# Patient Record
Sex: Female | Born: 1975 | Race: White | Hispanic: No | Marital: Single | Smoking: Former smoker
Health system: Southern US, Community
[De-identification: ages and names within clinical notes are randomized; demographics above are authoritative.]

## PROBLEM LIST (undated history)

## (undated) DIAGNOSIS — F419 Anxiety disorder, unspecified: Secondary | ICD-10-CM

## (undated) DIAGNOSIS — M1990 Unspecified osteoarthritis, unspecified site: Secondary | ICD-10-CM

## (undated) DIAGNOSIS — E282 Polycystic ovarian syndrome: Secondary | ICD-10-CM

## (undated) DIAGNOSIS — M797 Fibromyalgia: Secondary | ICD-10-CM

## (undated) DIAGNOSIS — I82409 Acute embolism and thrombosis of unspecified deep veins of unspecified lower extremity: Secondary | ICD-10-CM

## (undated) DIAGNOSIS — N809 Endometriosis, unspecified: Secondary | ICD-10-CM

## (undated) DIAGNOSIS — Q613 Polycystic kidney, unspecified: Secondary | ICD-10-CM

## (undated) DIAGNOSIS — F32A Depression, unspecified: Secondary | ICD-10-CM

## (undated) DIAGNOSIS — F329 Major depressive disorder, single episode, unspecified: Secondary | ICD-10-CM

## (undated) DIAGNOSIS — R519 Headache, unspecified: Secondary | ICD-10-CM

## (undated) DIAGNOSIS — Z8249 Family history of ischemic heart disease and other diseases of the circulatory system: Secondary | ICD-10-CM

## (undated) DIAGNOSIS — F411 Generalized anxiety disorder: Secondary | ICD-10-CM

## (undated) DIAGNOSIS — N3010 Interstitial cystitis (chronic) without hematuria: Secondary | ICD-10-CM

## (undated) DIAGNOSIS — Z9289 Personal history of other medical treatment: Secondary | ICD-10-CM

## (undated) DIAGNOSIS — J45909 Unspecified asthma, uncomplicated: Secondary | ICD-10-CM

## (undated) DIAGNOSIS — Z87891 Personal history of nicotine dependence: Secondary | ICD-10-CM

## (undated) DIAGNOSIS — D649 Anemia, unspecified: Secondary | ICD-10-CM

## (undated) DIAGNOSIS — R51 Headache: Secondary | ICD-10-CM

## (undated) HISTORY — DX: Unspecified asthma, uncomplicated: J45.909

## (undated) HISTORY — PX: OTHER SURGICAL HISTORY: SHX169

## (undated) HISTORY — PX: BLADDER SURGERY: SHX569

## (undated) HISTORY — PX: LAPAROSCOPY: SHX197

## (undated) HISTORY — DX: Interstitial cystitis (chronic) without hematuria: N30.10

## (undated) HISTORY — PX: TOOTH EXTRACTION: SUR596

## (undated) HISTORY — DX: Fibromyalgia: M79.7

## (undated) HISTORY — DX: Generalized anxiety disorder: F41.1

## (undated) HISTORY — DX: Polycystic ovarian syndrome: E28.2

## (undated) HISTORY — DX: Personal history of other medical treatment: Z92.89

## (undated) HISTORY — PX: ABDOMINAL HYSTERECTOMY: SHX81

## (undated) HISTORY — DX: Polycystic kidney, unspecified: Q61.3

## (undated) HISTORY — DX: Personal history of nicotine dependence: Z87.891

## (undated) HISTORY — DX: Family history of ischemic heart disease and other diseases of the circulatory system: Z82.49

---

## 1998-03-06 ENCOUNTER — Ambulatory Visit (HOSPITAL_COMMUNITY): Admission: RE | Admit: 1998-03-06 | Discharge: 1998-03-06 | Payer: Self-pay | Admitting: Specialist

## 1998-03-24 ENCOUNTER — Encounter: Payer: Self-pay | Admitting: Specialist

## 1998-03-24 ENCOUNTER — Ambulatory Visit (HOSPITAL_COMMUNITY): Admission: RE | Admit: 1998-03-24 | Discharge: 1998-03-24 | Payer: Self-pay | Admitting: Specialist

## 1999-04-05 ENCOUNTER — Ambulatory Visit (HOSPITAL_COMMUNITY): Admission: RE | Admit: 1999-04-05 | Discharge: 1999-04-05 | Payer: Self-pay | Admitting: Family Medicine

## 1999-04-05 ENCOUNTER — Encounter: Payer: Self-pay | Admitting: Family Medicine

## 2000-09-10 ENCOUNTER — Other Ambulatory Visit: Admission: RE | Admit: 2000-09-10 | Discharge: 2000-09-10 | Payer: Self-pay | Admitting: *Deleted

## 2001-09-23 ENCOUNTER — Other Ambulatory Visit: Admission: RE | Admit: 2001-09-23 | Discharge: 2001-09-23 | Payer: Self-pay | Admitting: *Deleted

## 2002-01-08 ENCOUNTER — Encounter: Admission: RE | Admit: 2002-01-08 | Discharge: 2002-01-08 | Payer: Self-pay | Admitting: Family Medicine

## 2002-01-08 ENCOUNTER — Encounter: Payer: Self-pay | Admitting: Family Medicine

## 2002-03-20 ENCOUNTER — Encounter: Admission: RE | Admit: 2002-03-20 | Discharge: 2002-03-20 | Payer: Self-pay | Admitting: Orthopedic Surgery

## 2002-03-20 ENCOUNTER — Encounter: Payer: Self-pay | Admitting: Orthopedic Surgery

## 2002-06-16 ENCOUNTER — Encounter: Admission: RE | Admit: 2002-06-16 | Discharge: 2002-06-16 | Payer: Self-pay | Admitting: Family Medicine

## 2002-06-16 ENCOUNTER — Encounter: Payer: Self-pay | Admitting: Family Medicine

## 2002-10-27 ENCOUNTER — Other Ambulatory Visit: Admission: RE | Admit: 2002-10-27 | Discharge: 2002-10-27 | Payer: Self-pay | Admitting: *Deleted

## 2002-10-29 ENCOUNTER — Encounter: Payer: Self-pay | Admitting: Nephrology

## 2002-10-29 ENCOUNTER — Encounter: Admission: RE | Admit: 2002-10-29 | Discharge: 2002-10-29 | Payer: Self-pay | Admitting: Nephrology

## 2003-03-22 ENCOUNTER — Encounter: Admission: RE | Admit: 2003-03-22 | Discharge: 2003-03-22 | Payer: Self-pay | Admitting: Nephrology

## 2003-03-22 ENCOUNTER — Encounter: Payer: Self-pay | Admitting: Nephrology

## 2003-10-13 ENCOUNTER — Other Ambulatory Visit: Admission: RE | Admit: 2003-10-13 | Discharge: 2003-10-13 | Payer: Self-pay | Admitting: *Deleted

## 2005-06-14 ENCOUNTER — Encounter: Admission: RE | Admit: 2005-06-14 | Discharge: 2005-06-14 | Payer: Self-pay | Admitting: Internal Medicine

## 2005-07-06 ENCOUNTER — Encounter: Admission: RE | Admit: 2005-07-06 | Discharge: 2005-07-06 | Payer: Self-pay | Admitting: Nephrology

## 2006-09-19 ENCOUNTER — Ambulatory Visit (HOSPITAL_COMMUNITY): Admission: RE | Admit: 2006-09-19 | Discharge: 2006-09-19 | Payer: Self-pay | Admitting: *Deleted

## 2006-09-21 ENCOUNTER — Inpatient Hospital Stay (HOSPITAL_COMMUNITY): Admission: AD | Admit: 2006-09-21 | Discharge: 2006-09-21 | Payer: Self-pay | Admitting: Obstetrics and Gynecology

## 2006-10-02 ENCOUNTER — Encounter: Admission: RE | Admit: 2006-10-02 | Discharge: 2006-10-02 | Payer: Self-pay | Admitting: Gastroenterology

## 2007-01-22 ENCOUNTER — Encounter: Admission: RE | Admit: 2007-01-22 | Discharge: 2007-01-22 | Payer: Self-pay | Admitting: Internal Medicine

## 2007-02-28 ENCOUNTER — Encounter: Admission: RE | Admit: 2007-02-28 | Discharge: 2007-02-28 | Payer: Self-pay | Admitting: Nephrology

## 2007-09-16 ENCOUNTER — Encounter (INDEPENDENT_AMBULATORY_CARE_PROVIDER_SITE_OTHER): Payer: Self-pay | Admitting: *Deleted

## 2007-09-16 ENCOUNTER — Ambulatory Visit (HOSPITAL_COMMUNITY): Admission: RE | Admit: 2007-09-16 | Discharge: 2007-09-16 | Payer: Self-pay | Admitting: *Deleted

## 2007-12-07 ENCOUNTER — Inpatient Hospital Stay (HOSPITAL_COMMUNITY): Admission: AD | Admit: 2007-12-07 | Discharge: 2007-12-08 | Payer: Self-pay | Admitting: Obstetrics & Gynecology

## 2008-02-18 ENCOUNTER — Encounter: Admission: RE | Admit: 2008-02-18 | Discharge: 2008-02-18 | Payer: Self-pay | Admitting: Otolaryngology

## 2008-02-18 ENCOUNTER — Emergency Department (HOSPITAL_COMMUNITY): Admission: EM | Admit: 2008-02-18 | Discharge: 2008-02-18 | Payer: Self-pay | Admitting: Family Medicine

## 2008-02-20 ENCOUNTER — Ambulatory Visit (HOSPITAL_COMMUNITY): Admission: RE | Admit: 2008-02-20 | Discharge: 2008-02-20 | Payer: Self-pay | Admitting: Urology

## 2008-03-19 ENCOUNTER — Encounter (INDEPENDENT_AMBULATORY_CARE_PROVIDER_SITE_OTHER): Payer: Self-pay | Admitting: Urology

## 2008-03-19 ENCOUNTER — Ambulatory Visit (HOSPITAL_BASED_OUTPATIENT_CLINIC_OR_DEPARTMENT_OTHER): Admission: RE | Admit: 2008-03-19 | Discharge: 2008-03-19 | Payer: Self-pay | Admitting: Urology

## 2008-05-22 ENCOUNTER — Inpatient Hospital Stay (HOSPITAL_COMMUNITY): Admission: AD | Admit: 2008-05-22 | Discharge: 2008-05-23 | Payer: Self-pay | Admitting: Obstetrics and Gynecology

## 2008-06-23 ENCOUNTER — Encounter: Admission: RE | Admit: 2008-06-23 | Discharge: 2008-06-23 | Payer: Self-pay | Admitting: Nephrology

## 2008-09-11 ENCOUNTER — Emergency Department (HOSPITAL_COMMUNITY): Admission: EM | Admit: 2008-09-11 | Discharge: 2008-09-11 | Payer: Self-pay | Admitting: Emergency Medicine

## 2009-02-16 ENCOUNTER — Inpatient Hospital Stay (HOSPITAL_COMMUNITY): Admission: AD | Admit: 2009-02-16 | Discharge: 2009-02-17 | Payer: Self-pay | Admitting: Obstetrics & Gynecology

## 2009-04-05 ENCOUNTER — Inpatient Hospital Stay (HOSPITAL_COMMUNITY): Admission: AD | Admit: 2009-04-05 | Discharge: 2009-04-05 | Payer: Self-pay | Admitting: Obstetrics

## 2009-04-20 ENCOUNTER — Ambulatory Visit: Payer: Self-pay | Admitting: Family Medicine

## 2009-04-25 ENCOUNTER — Emergency Department (HOSPITAL_COMMUNITY): Admission: EM | Admit: 2009-04-25 | Discharge: 2009-04-25 | Payer: Self-pay | Admitting: Emergency Medicine

## 2009-04-27 ENCOUNTER — Ambulatory Visit: Payer: Self-pay | Admitting: Family Medicine

## 2009-05-05 ENCOUNTER — Ambulatory Visit: Payer: Self-pay | Admitting: Family Medicine

## 2009-05-25 ENCOUNTER — Ambulatory Visit: Payer: Self-pay | Admitting: Family Medicine

## 2009-07-28 ENCOUNTER — Encounter: Admission: RE | Admit: 2009-07-28 | Discharge: 2009-07-28 | Payer: Self-pay | Admitting: Family Medicine

## 2009-07-28 ENCOUNTER — Ambulatory Visit: Payer: Self-pay | Admitting: Family Medicine

## 2009-08-05 ENCOUNTER — Encounter: Admission: RE | Admit: 2009-08-05 | Discharge: 2009-08-05 | Payer: Self-pay | Admitting: Otolaryngology

## 2009-08-16 ENCOUNTER — Ambulatory Visit: Payer: Self-pay | Admitting: Family Medicine

## 2009-11-14 ENCOUNTER — Ambulatory Visit: Payer: Self-pay | Admitting: Family Medicine

## 2009-11-29 ENCOUNTER — Ambulatory Visit: Payer: Self-pay | Admitting: Family Medicine

## 2010-03-14 ENCOUNTER — Ambulatory Visit: Payer: Self-pay | Admitting: Family Medicine

## 2010-05-23 ENCOUNTER — Ambulatory Visit: Payer: Self-pay | Admitting: Family Medicine

## 2010-07-23 ENCOUNTER — Encounter: Payer: Self-pay | Admitting: Nephrology

## 2010-09-25 ENCOUNTER — Ambulatory Visit (INDEPENDENT_AMBULATORY_CARE_PROVIDER_SITE_OTHER): Payer: PRIVATE HEALTH INSURANCE | Admitting: Family Medicine

## 2010-09-25 DIAGNOSIS — J069 Acute upper respiratory infection, unspecified: Secondary | ICD-10-CM

## 2010-09-25 DIAGNOSIS — F411 Generalized anxiety disorder: Secondary | ICD-10-CM

## 2010-09-25 DIAGNOSIS — F17200 Nicotine dependence, unspecified, uncomplicated: Secondary | ICD-10-CM

## 2010-09-25 DIAGNOSIS — J309 Allergic rhinitis, unspecified: Secondary | ICD-10-CM

## 2010-10-05 LAB — CBC
HCT: 34 % — ABNORMAL LOW (ref 36.0–46.0)
Hemoglobin: 11.3 g/dL — ABNORMAL LOW (ref 12.0–15.0)
MCHC: 33.3 g/dL (ref 30.0–36.0)
MCV: 92.7 fL (ref 78.0–100.0)
Platelets: 335 10*3/uL (ref 150–400)
RBC: 3.66 MIL/uL — ABNORMAL LOW (ref 3.87–5.11)
RDW: 13.6 % (ref 11.5–15.5)
WBC: 15.5 10*3/uL — ABNORMAL HIGH (ref 4.0–10.5)

## 2010-10-05 LAB — URINALYSIS, ROUTINE W REFLEX MICROSCOPIC
Hgb urine dipstick: NEGATIVE
Ketones, ur: 15 mg/dL — AB
Nitrite: NEGATIVE
Protein, ur: 30 mg/dL — AB
Urobilinogen, UA: 0.2 mg/dL (ref 0.0–1.0)

## 2010-10-05 LAB — COMPREHENSIVE METABOLIC PANEL WITH GFR
ALT: 17 U/L (ref 0–35)
AST: 18 U/L (ref 0–37)
Albumin: 3.4 g/dL — ABNORMAL LOW (ref 3.5–5.2)
Alkaline Phosphatase: 45 U/L (ref 39–117)
BUN: 8 mg/dL (ref 6–23)
CO2: 26 meq/L (ref 19–32)
Calcium: 9.2 mg/dL (ref 8.4–10.5)
Chloride: 101 meq/L (ref 96–112)
Creatinine, Ser: 0.61 mg/dL (ref 0.4–1.2)
GFR calc non Af Amer: >60 mL/min
Glucose, Bld: 105 mg/dL — ABNORMAL HIGH (ref 70–99)
Potassium: 3.7 meq/L (ref 3.5–5.1)
Sodium: 133 meq/L — ABNORMAL LOW (ref 135–145)
Total Bilirubin: 0.4 mg/dL (ref 0.3–1.2)
Total Protein: 6.3 g/dL (ref 6.0–8.3)

## 2010-10-05 LAB — WET PREP, GENITAL
Trich, Wet Prep: NONE SEEN
Yeast Wet Prep HPF POC: NONE SEEN

## 2010-10-05 LAB — DIFFERENTIAL
Basophils Relative: 1 % (ref 0–1)
Eosinophils Absolute: 0.1 10*3/uL (ref 0.0–0.7)
Eosinophils Relative: 1 % (ref 0–5)
Lymphs Abs: 3 10*3/uL (ref 0.7–4.0)
Neutrophils Relative %: 75 % (ref 43–77)

## 2010-10-05 LAB — GC/CHLAMYDIA PROBE AMP, GENITAL
Chlamydia, DNA Probe: NEGATIVE
GC Probe Amp, Genital: NEGATIVE

## 2010-10-05 LAB — URINE MICROSCOPIC-ADD ON

## 2010-10-07 LAB — GC/CHLAMYDIA PROBE AMP, GENITAL
Chlamydia, DNA Probe: NEGATIVE
GC Probe Amp, Genital: NEGATIVE

## 2010-10-07 LAB — URINALYSIS, ROUTINE W REFLEX MICROSCOPIC
Hgb urine dipstick: NEGATIVE
Nitrite: POSITIVE — AB
Specific Gravity, Urine: >1.03 — ABNORMAL HIGH (ref 1.005–1.030)
Urobilinogen, UA: 1 mg/dL (ref 0.0–1.0)
pH: 5.5 (ref 5.0–8.0)

## 2010-10-07 LAB — URINE MICROSCOPIC-ADD ON

## 2010-10-07 LAB — WET PREP, GENITAL: Trich, Wet Prep: NONE SEEN

## 2010-11-09 ENCOUNTER — Encounter: Payer: Self-pay | Admitting: *Deleted

## 2010-11-09 ENCOUNTER — Ambulatory Visit (INDEPENDENT_AMBULATORY_CARE_PROVIDER_SITE_OTHER): Payer: PRIVATE HEALTH INSURANCE | Admitting: Family Medicine

## 2010-11-09 ENCOUNTER — Encounter: Payer: Self-pay | Admitting: Family Medicine

## 2010-11-09 VITALS — BP 102/62 | HR 80 | Temp 98.3°F | Ht 61.0 in | Wt 128.0 lb

## 2010-11-09 DIAGNOSIS — F411 Generalized anxiety disorder: Secondary | ICD-10-CM

## 2010-11-09 DIAGNOSIS — H6980 Other specified disorders of Eustachian tube, unspecified ear: Secondary | ICD-10-CM

## 2010-11-09 DIAGNOSIS — F17200 Nicotine dependence, unspecified, uncomplicated: Secondary | ICD-10-CM

## 2010-11-09 MED ORDER — BUPROPION HCL ER (XL) 150 MG PO TB24: 150.0000 mg | ORAL_TABLET | ORAL | Status: AC

## 2010-11-09 NOTE — Patient Instructions (Addendum)
Decongestants, Mucinex and sinus rinses are recommended Start the Wellbutrin--take in the morning.  Continue your Zoloft.   Strongly encourage cessation of tobacco and marijuana use

## 2010-11-09 NOTE — Progress Notes (Signed)
Patient presents with complaint of R ear pain x 2 days.  No associated runny nose, sore throat, does complain of pressure between her eyes.  Postnasal drainage, and occasionally coughs up discolored phlegm.  Low grade fever yesterday.  No sick contacts.  She continues to smoke cigarettes, as well as marijuana on a daily basis.  She is having a lot of anxiety related to her father's health (in hospital, waiting for kidney transplant, getting cardiac cath now).  Has been on 200mg  of Zoloft for her anxiety for quite some time (about 3 years at this dose), doesn't feel that it is working as well as it used to.  Recalls having tried Wellbutrin in the past--for smoking cessation, unsuccessful.  Doesn't recall having had any side effects to it.    She will be turning 35 this summer, is on birth control pills and continues to smoke.  She has appt with GYN next week to discuss her options.  ROS: no n/v/d, no chest pain, palpitations, skin rashes, joint pains, urinary problems (per baseline) or other concerns Mild sinus headaches qod  Physical exam: BP 102/62  Pulse 80  Temp(Src) 98.3 F (36.8 C) (Oral)  Ht 5\' 1"  (1.549 m)  Wt 128 lb (58.06 kg)  BMI 24.19 kg/m2 HEENT:  TMs and EAC's normal bilaterally.  Nasal mucosa mildly edematous, no erythema or purulence.  OP clear.  Sinuses--tender x 4, mildly so, mostly between eyes. Neck:  No LAD or thyromegaly Heart: RRR no m/r/g Lungs: CTA Psych: normal mood, affect, grooming, normal speech and eye contact  Assessment/plan: 1. Eustachian tube dysfunction    2. Anxiety state, unspecified  buPROPion (WELLBUTRIN XL) 150 MG 24 hr tablet   Add Wellbutrin to Zoloft.  If not effective, consider adding Buspar at f/u  3. Tobacco use disorder     Decongestants, Mucinex and sinus rinses are recommended.  Strongly encouraged to quit smoking (both cigarettes and marijuana).  Risks of smoking and OCP's reviewed with pt.  She will discuss with her GYN.

## 2010-11-10 ENCOUNTER — Ambulatory Visit: Payer: PRIVATE HEALTH INSURANCE | Admitting: Medical

## 2010-11-14 NOTE — Op Note (Signed)
NAME:  Sharon Bowman, BURESH NO.:  [id number]   MEDICAL RECORD NO.:  [id number]          PATIENT TYPE:  AMB   LOCATION:  DAY                          FACILITY:  [facility]   PHYSICIAN:  Bettles B. Earlene Plater, M.D.  DATE OF BIRTH:  June 09, 1976   DATE OF PROCEDURE:  09/16/2007  DATE OF DISCHARGE:                               OPERATIVE REPORT   PREOPERATIVE DIAGNOSES:  1. Pelvic pain.  2. Dysmenorrhea.  3. Endometriosis.   POSTOPERATIVE DIAGNOSES:  1. Pelvic pain.  2. Dysmenorrhea.  3. Endometriosis.   PROCEDURES:  Da Vinci laparoscopy, lysis of adhesions and ureterolysis  (right-sided) and excision of endometriosis from posterior cul-de-sac.   SURGEON:  Chester Holstein. Earlene Plater, M.D.   ASSISTANT:  Genia Del, M.D.   ANESTHESIA:  General.   FINDINGS:  Rectum adherent to the posterior uterus, endometriosis left  and right uterosacral ligaments.   SPECIMENS:  Right and left sided uterosacral ligament, endometriosis  implants to pathology.   BLOOD LOSS:  Minimal.   COMPLICATIONS:  None.   HISTORY OF PRESENT ILLNESS:  Patient with a known history of surgical  diagnosis endometriosis in the past, medically managed after surgery  with birth control pills, now presents with recurring increasing pelvic  pain, dysmenorrhea, dyspareunia.  Advised of the risks of surgery  including infection, bleeding and damage to surrounding organs.   PROCEDURE:  The patient is taken to the operating room and general  anesthesia obtained.  Prepped and draped in standard fashion.  Bladder  drained with Foley catheter.  Uterus sounded to 8 cm, #8 Rumi tip  inserted and secured in standard manner.   A 10 mm incision placed in the umbilicus, fascia divided sharply and  elevated.  Posterior sheath and peritoneum elevated and entered sharply.  Pursestring suture of zero Vicryl placed on the left fascial defect.  Hassan cannula inserted and secured.  Pneumoperitoneum obtained with CO2  gas. An 8  mm trocar placed left of the umbilical incision.  Trendelenburg position obtained and pelvis inspected, above findings  noted.  Given the pathology, additional ports were inserted, an 8 mm to  the right of midline and a 5 mm as well.   Steep Trendelenburg position obtained.  Robot brought up and docked in  standard manner.   The course of each ureter was identified.  The left ureter was well  lateral to the endometriosis implants and rectal adhesions to the middle  portion of the posterior uterus between the uterosacral ligaments.  There was a slight window to the right of midline allowing visualization  into the posterior cul-de-sac.  This line was used to dissect the rectum  off the uterus to the left.  The ureter was well away throughout the  dissection.  There were endometriosis implants on the left uterosacral  ligament on the medial portion which were excised sharply.   On the right side the ureter appeared to go right through the area of  adhesions which was scarring including uterosacral ligament and rectum.  Therefore, the rectum was deviated medially, peritoneum elevated over  the iliac, entered and retroperitoneal dissection proceeded  along the  course of the ureter to mobilize the ureter away from the area of  adhesions at the insertion of the right uterosacral ligament.  This also  allowed dissection of the rectum away from the uterus on the right side.  Then the endometriosis implants were sharply excised and hemostasis  obtained.   The pelvis was then copiously irrigated.  Pneumoperitoneum taken down to  minimal settings and pelvis inspected with no bleeding seen.  Intercede  was placed along the posterior cul-de-sac covering the two areas of  dissection.   The remainder of the pelvis was inspected and no additional findings  were of note other than a old scar of endometriosis in the anterior cul-  de-sac to the right of midline which did not appear active and was   therefore left untreated.  The appendix appeared normal and the bowel  appeared normal.  Therefore the procedure was terminated.   The robot was undocked and taken away.  The ports were removed.  Their  sites were inspected laparoscopically and were hemostatic.  Scope was  removed.  Gas released.  Hassan cannula removed.  Umbilical incision  elevated with Army-Navy retractors.  Pursestring suture snugged down.  This obliterated the fascial defect and no intra-abdominal contents  herniated through prior to closure.  Skin was closed at each site with 4-  0 Vicryl and Dermabond.  The Rumi was removed and cervix hemostatic.   The patient tolerated the procedure well with no complications.  She was  taken to the recovery room awake, alert and in stable condition.      Gerri Spore B. Earlene Plater, M.D.  Electronically Signed     WBD/MEDQ  D:  09/16/2007  T:  09/16/2007  Job:  [id number]

## 2010-11-14 NOTE — Op Note (Signed)
NAME:  Sharon, Bowman NO.:  [id number]   MEDICAL RECORD NO.:  [id number]          PATIENT TYPE:  AMB   LOCATION:  [facility]                         FACILITY:  [facility]   PHYSICIAN:  Jamison Neighbor, M.D.  DATE OF BIRTH:  [birth year]   DATE OF PROCEDURE:  03/19/2008  DATE OF DISCHARGE:  03/19/2008                               OPERATIVE REPORT   PREOPERATIVE DIAGNOSIS:  Interstitial cystitis.   POSTOPERATIVE DIAGNOSIS:  Interstitial cystitis.   PROCEDURE:  Cystoscopy, urethral calibration, hydrodistention of the  bladder, Marcaine and Pyridium installation, Marcaine and Kenalog  injection, bladder biopsy.   SURGEON:  Jamison Neighbor, M.D.   ANESTHESIA:  General.   COMPLICATIONS:  None.   DRAINS:  None.   BRIEF HISTORY:  This 35 year old female has chronic pelvic pain.  She  does have a past history of endometriosis and underwent a laparoscopy in  the past by Dr. Marina Gravel.  The patient would like to undergo  cystoscopy, hydrodistention for reevaluation of her pelvic pain.  The  patient is now to undergo cystoscopic evaluation and we did discuss with  gynecology whether they elect to do a laparoscopy but they are going to  wait until they see whether the patient has any improvement with  hydrodistention.  Full informed consent was obtained.   PROCEDURE:  After successful induction of general anesthesia, the  patient was placed in the dorsal lithotomy position, prepped with  Betadine and draped in the usual sterile fashion.  Careful bimanual  examination revealed an unremarkable urethra with no signs of  diverticulum.  There was no significant cystocele, rectocele or  enterocele.  There were no masses on bimanual exam.  The uterus was  palpably normal.  The urethra was calibrated 32-French with female  urethral sounds with no signs of stenosis or stricture.  The cystoscope  was inserted.  The bladder was carefully inspected.  No tumors or stones  could be seen.   Both orifices were normal in configuration and location.  Hydrodistention of the bladder was performed.  The bladder was distended  at a pressure of 100 cm of water for 5 minutes.  When the bladder was  drained, the bladder capacity of 550 mL was markedly diminished compared  to normal bladder capacity of 1100.  There were glomerulations seen but  no ulcer formation.  A bladder biopsy was taken and the biopsy site was  cauterized.  This will be sent for mast cell analysis.  The bladder was  drained.  A mixture of Marcaine and Pyridium was left in the bladder.  Marcaine and Kenalog were injected periurethrally.  The patient  tolerated the procedure well and was taken to recovery room in good  condition.  She was given a B & O suppository intraoperatively as well  as Toradol and Zofran.  She will be sent home with Lorcet Plus, Pyridium  Plus and doxycycline.  When she returns, we will review of biopsy as  well as results of her hydrodistention to determine  if she had any improvement and likely will start her on interstitial  cystitis directed  therapy with Elmiron based protocol.  This may also  include instillation therapy.  If the patient still has low lying pelvic  pain that does not appear to respond, we certainly recommend  reevaluation determine if her endometriosis has recurred.      Jamison Neighbor, M.D.  Electronically Signed     RJE/MEDQ  D:  03/19/2008  T:  03/22/2008  Job:  [id number]   cc:   Lendon Colonel, MD

## 2010-11-16 ENCOUNTER — Encounter (INDEPENDENT_AMBULATORY_CARE_PROVIDER_SITE_OTHER): Payer: PRIVATE HEALTH INSURANCE | Admitting: Obstetrics and Gynecology

## 2010-11-16 DIAGNOSIS — Z1272 Encounter for screening for malignant neoplasm of vagina: Secondary | ICD-10-CM

## 2010-11-16 DIAGNOSIS — Z01419 Encounter for gynecological examination (general) (routine) without abnormal findings: Secondary | ICD-10-CM

## 2010-11-16 DIAGNOSIS — Z113 Encounter for screening for infections with a predominantly sexual mode of transmission: Secondary | ICD-10-CM

## 2010-11-17 NOTE — Consult Note (Signed)
NAME:  Sharon Bowman, Sharon Bowman NO.:  [id number]   MEDICAL RECORD NO.:  [id number]          PATIENT TYPE:  MAT   LOCATION:  [facility]                          FACILITY:  [facility]   PHYSICIAN:  Richardean Sale, M.D.   DATE OF BIRTH:  1976-02-11   DATE OF CONSULTATION:  09/21/2006  DATE OF DISCHARGE:                                 CONSULTATION   CHIEF COMPLAINT:  Abdominal and back pain status post laparoscopy on  09/19/2006.   HISTORY OF PRESENT ILLNESS:  This is a 35 year old white female with a  known history of interstitial cystitis who underwent a laparoscopy with  lysis of adhesions and fulguration of endometriosis on 09/19/2006  without complications for endometriosis and pelvic adhesive disease.  The patient was discharged home in standard fashion the same day as her  procedure. She was taking Vicodin as needed for pain 1 tablet every 4  hours.  She has complained of increased abdominal swelling and lower  back pain that radiates up to her middle back that is bilateral. She  denies any dysuria or hematuria other than her known symptoms from  interstitial cystitis and she reports passing flatus, but no bowel  movement.  She had one episode of vomiting on postop day zero, but  otherwise has been able to tolerate p.o. intake.  She denies any fever,  chills or sweats or any drainage from her incision.   PAST MEDICAL HISTORY:  Significant for  1. Interstitial cystitis.  2. Polycystic ovarian syndrome.  3. Polycystic kidney disease.   SURGICAL HISTORY:  1. Bladder cystoscopy.  2. Recent laparoscopy.   MEDICATIONS:  1. Ultram for her kidney disease.  2. Recent use of Vicodin and Darvocet for postoperative pain.   ALLERGIES:  LATEX.   SOCIAL HISTORY:  Denies tobacco, alcohol or drugs.   PHYSICAL EXAMINATION:  VITAL SIGNS:  She is afebrile.  Her vital signs  are stable.  GENERAL:  She is a well-developed, well-nourished white female who  appears uncomfortable, but in no  acute distress.  HEART:  Regular rate and rhythm.  LUNGS: Clear to auscultation bilaterally.  ABDOMEN: Soft, mildly distended, tympanic to percussion, not acute.  No  rebound or guarding. Incisions are clean, dry and intact.  There is a 3  x 5 cm area of erythema just inferior to the of the umbilical incision  that is slightly warm to the touch.  BACK:  No CVA tenderness.  Mild tenderness to palpation and bilateral  lower side of the back.   LABORATORY STUDIES:  Hemoglobin is 11.1, hematocrit 33.4, white count  10.5, platelet count is 260.  Urinalysis is negative except for small  hemoglobin.  The patient is having some vaginal bleeding as expected  from her surgery. Metabolic panel: Sodium 138, potassium 3.8, chloride  104, carbon dioxide 28, glucose 116, BUN 3, creatinine 0.55, AST is 57,  ALT is 54. Alkaline phosphatase 51.  Bilirubin 0.3.   ASSESSMENT:  This is a 35 year old white female who is postop day #2  status post laparoscopy with lysis of adhesions and fulguration of  endometriosis now with abdominal  tenderness, lower back pain and  erythema around her umbilical incision.   PLAN:  1. For the umbilical incision, we will start Keflex 500 mg p.o. t.i.d.      for 7 days for probable cellulitis.  2. Abdominal pain and back pain.  Suspect this is likely typical      postop pain although the patient does seem slightly more      uncomfortable than expected on exam. She has passed some gas      although not a large amount.  She has good bowel sounds, and I      doubt this is an obstructive process. We will check ultrasound of      the abdomen and pelvis uncertain as to why her liver function      enzymes may be slightly elevated although it may be due to      medication and acetaminophen use.  We will wait ultrasound results.  3  .  Will administer Toradol 30 mg x1 for pain while ultrasound is  pending.      Richardean Sale, M.D.  Electronically Signed     JW/MEDQ  D:   09/21/2006  T:  09/21/2006  Job:  [id number]

## 2010-11-17 NOTE — Assessment & Plan Note (Signed)
NAME:  Sharon Bowman, Sharon Bowman NO.:  [id number]  MEDICAL RECORD NO.:  [id number]            PATIENT TYPE:  LOCATION:  [facility]           FACILITY:  PHYSICIAN:  Catalina Antigua, MD          DATE OF BIRTH:  DATE OF SERVICE:  11/16/2010                                 CLINIC NOTE  This is a 35 year old G1, P0-0-1-0, who presents today for annual exam. The patient is currently without any complaints.  Denies abnormal bleeding or discharge.  The patient reports a history of endometriosis, for which she is on continuous OCPs and states that her pain is relatively well managed with this current regimen.  PAST MEDICAL HISTORY:  Significant for polycystic kidney disease which is being followed at [facility], also interstitial cystitis which is being followed by urologist, and endometriosis diagnosed on laparoscopy.  PAST SURGICAL HISTORY:  She has had 2 laparoscopies in 2008 and 2010 for management of endometriosis.  PAST OB HISTORY:  She has had 1 termination approximately 18 years ago.  PAST GYN HISTORY:  She denies any cyst or fibroids or history of abnormal Pap smear.  FAMILY HISTORY:  Significant for diabetes and heart disease in her father.  Her father also has polycystic kidney disease and hypertension in both of her parents.  SOCIAL HISTORY:  She denies drinking or use of illicit drugs.  She is a current smoker of 1 pack per day for at least 15 years.  REVIEW OF SYSTEMS:  Otherwise, within normal limits.  PHYSICAL EXAMINATION:  VITAL SIGNS:  Her blood pressure is 101/64, pulse of 67, weight of 128 pounds, height of 5 feet and 1 inch. LUNGS:  Clear to auscultation bilaterally. HEART:  Regular rate and rhythm. BREASTS:  Nontender, equal in size.  No palpable masses or lymphadenopathy.  No expressible nipple discharge or skin dimpling. ABDOMEN:  Soft, nontender, nondistended.  Slight discomfort over the suprapubic region. PELVIC:  She had  normal-appearing external genitalia, normal-appearing vaginal mucosa and cervix.  No abnormal bleeding or discharge.  She had a small retroverted uterus.  No palpable adnexal masses or tenderness.  ASSESSMENT AND PLAN:  This is a 35 year old G1, P0-0-1-0 who is here for annual exam.  Pap smear and cultures were performed.  The patient desired full STD testing and therefore blood work for hepatitis B, C, human immunodeficiency virus, and syphilis screening will be obtained. The patient was counseled regarding the use of oral contraceptive pills and smoking with associated increased risk of coronary artery disease. The patient verbalized understanding and understood that the oral contraceptive pills will need to be discontinued.  The patient was counseled on the use of Depo-Provera to help with the management of her endometriosis.  The patient states that she will return to schedule the appointment for the Depo-Provera in a month or 2 when her oral contraceptive pills will be completed.  The patient also expressed the desire of having children, but reports that she was told in the past that she could not get pregnant in view of her extensive endometriosis as well as her polycystic kidney disease.  A referral to Reproductive Endocrinology and infertility specialist was offered, but the  patient declined at this time but will call when she is ready.  The patient will be contacted with any abnormal results.  The patient will return in a few months to initiate Depo-Provera for her management of her endometriosis or p.r.n.  The patient does not desire hysterectomy or oophorectomy for the management of her endometriosis as she states she is still battling with the idea of starting a family.          ______________________________ Catalina Antigua, MD    PC/MEDQ  D:  11/16/2010  T:  11/16/2010  Job:  [id number]

## 2010-11-17 NOTE — Op Note (Signed)
NAME:  Sharon Bowman, Sharon Bowman NO.:  [id number]   MEDICAL RECORD NO.:  [id number]          PATIENT TYPE:  AMB   LOCATION:  [facility]                           FACILITY:  [facility]   PHYSICIAN:  [place] B. Earlene Plater, M.D.  DATE OF BIRTH:  [birth year]   DATE OF PROCEDURE:  09/18/2006  DATE OF DISCHARGE:                               OPERATIVE REPORT   PREOPERATIVE DIAGNOSIS:  Pelvic pain.   POSTOPERATIVE DIAGNOSIS:  Pelvic pain.   PROCEDURE:  Diagnostic laparoscopy, lysis of adhesions, fulguration of  endometriosis.   SURGEON:  Marina Gravel, MD   ASSISTANT:  None.   ANESTHESIA:  General.   SPECIMENS:  None.   BLOOD LOSS:  None.   COMPLICATIONS:  None.   SURGICAL FINDINGS:  Posterior cul-de-sac is obliterated with the rectum  adherent to posterior uterus.  Endometriosis scattered implants noted  bilateral pelvic sidewall and at the cecum.  Appendix appeared normal.  Upper abdomen appeared normal.   INDICATION:  The patient with a history of pelvic pain unresponsive to  medical management also has a history of interstitial cystitis and a  previous history of infertility which did not respond to  Injectable fertility drugs.  The patient was advised of the risks of  surgical infection, bleeding, damage to surrounding organs.   PROCEDURE:  The patient taken to the operating room, general anesthesia  obtained.  She was prepped and draped in standard fashion.  Foley  catheter inserted into the bladder.  Hulka tenaculum attached to the  anterior lip of the cervix.   A 10 mm incision placed in the umbilicus, carried sharply to the fascia.  The fascia was divided sharply and elevated with Kocher clamps.  The  posterior sheath and peritoneum elevated and entered sharply.  Pursestring suture of 0 Vicryl placed around the fascial defect.  Hassan  cannula inserted and secured.  Pneumoperitoneum with CO2 gas.  A 5 mm  port placed in left lower quadrant under direct laparoscopic  visualization.   Trendelenberg position obtained.  Pelvis inspected with the above  findings noted.  The adhesions from the rectum to the uterus were mostly  filmy.  The right ovary was also noted to have some filmy adhesions to  the rectum.  These were taken down sharply.  In the posterior cul-de-  sac, the uterus and rectum could not be completely separated as in the  deeper portion, the adhesions were very dense.  It was my opinion that  lysing these would not be of any particular benefit, as they would  likely reform.  There was no active endometriosis visualized in the cul-  de-sac with what visualization I had of the posterior cul-de-sac.  The  anterior cul-de-sac was clear.  There were some scattered endometriosis  implants 2-3 mm right pelvic sidewall which were treated with  electrocautery.  The right ovary was tethered to the rectum, and this  was taken down sharply.  Left tube and ovary appeared normal.  No  evidence of endometriosis of either ovary.  Given there was no other  active disease noted and no other adhesions that  I felt were treatable,  the procedure was terminated.   The inferior port was removed.  The site was hemostatic.  Scope was  removed, gas released,  Hasson cannula removed.  Abdominal wall elevated  with Army-Navy retractor, pursestring suture snugged down.  This  obliterated the fascial defect.  No intra-abdominal contents herniated  through prior closure.  Skin was closed at each site with Dermabond,  Hulka tenaculum removed, cervix hemostatic.   The patient tolerated the procedure well with no complications.  She was  taken to the recovery room awake, alert, and in stable condition.      Gerri Spore B. Earlene Plater, M.D.  Electronically Signed     WBD/MEDQ  D:  09/19/2006  T:  09/19/2006  Job:  [id number]

## 2010-11-17 NOTE — Consult Note (Signed)
NAME:  JONNE, ROTE NO.:  [id number]   MEDICAL RECORD NO.:  [id number]          PATIENT TYPE:  MAT   LOCATION:  [facility]                          FACILITY:  [facility]   PHYSICIAN:  Richardean Sale, M.D.   DATE OF BIRTH:  [birth year]   DATE OF CONSULTATION:  09/21/2006  DATE OF DISCHARGE:                                 CONSULTATION   SUBJECTIVE:  The patient states that pain is now improved after  receiving Toradol 30 mg IM x1. The pain is now a 3 out of 10. She denies  nausea and vomiting.   OBJECTIVE:  VITAL SIGNS:  She is afebrile. Vital signs are stable.  ABDOMEN:  Soft. Mildly tender to palpation. No significant change from  prior examination.   LABORATORY/DIAGNOSTIC DATA:  Ultrasound reveals non-specific mild  gallbladder wall thickening with gallbladder sludge but no stones or  pericholecystic fluid, likely due to suboptimal distention. No biliary  dilation and no other significant abnormalities. Pelvis is negative  except for a 2.5 cm right ovarian follicle.   ASSESSMENT:  A 35 year old white female, postoperative day 2, status  post diagnostic laparoscopy with lysis of adhesions, fulguration of  endometriosis, now with abdominal pain, lower back pain, and incidental  elevated liver function studies.   PLAN:  1. Pain seems to be improved with Toradol. Given that the patient has      polycystic kidney disease, we cannot use any prolonged course of      non-steroidal anti-inflammatories. Therefore, will treat pain with      plain oxycodone 5 mg 1 to 2 tabs p.o. q.4 to 6 hours p.r.n. pain.      The patient is to avoid any acetaminophen containing medications at      this time, given her elevated LFT's. I suspect her elevated LFT's      are due to acetaminophen and not any acute gallbladder or liver      dysfunction but I would like for her to followup with      gastroenterology this week and she will need repeat LFT's after she      has been off of any  acetaminophen containing medications.  2. Mild cellulitis of the umbilical incision. Will start Dicloxacillin      250 mg p.o. q.6 hours x7 days. The patient will call for any      spreading, erythema, or any drainage from her incision, or any      fever over 100.5.      Richardean Sale, M.D.  Electronically Signed     JW/MEDQ  D:  09/21/2006  T:  09/21/2006  Job:  [id number]

## 2010-11-23 ENCOUNTER — Telehealth: Payer: Self-pay | Admitting: Family Medicine

## 2010-11-23 MED ORDER — AMOXICILLIN 500 MG PO TABS: ORAL_TABLET | ORAL | Status: DC

## 2010-11-23 NOTE — Telephone Encounter (Signed)
Advise pt that Amoxicillin was sent to her pharmacy.  Remind her that antibiotics lessen the efficacy of her OCP's and to use back-up contraception to avoid unwanted pregnancy.  Continue to use Mucinex and decongestants as needed.  Follow up next week if not improving

## 2010-11-23 NOTE — Telephone Encounter (Signed)
Pt called still coughing up green mucous.  Pt req antibiotic be called in to [facility] [address]

## 2010-11-24 ENCOUNTER — Other Ambulatory Visit: Payer: Self-pay

## 2010-11-24 NOTE — Telephone Encounter (Signed)
Left message for pt med had been called in per [facility]

## 2010-11-30 ENCOUNTER — Ambulatory Visit: Payer: PRIVATE HEALTH INSURANCE

## 2010-12-21 ENCOUNTER — Ambulatory Visit: Payer: PRIVATE HEALTH INSURANCE | Admitting: Family Medicine

## 2011-02-02 ENCOUNTER — Encounter: Payer: Self-pay | Admitting: Medical

## 2011-02-02 ENCOUNTER — Ambulatory Visit (INDEPENDENT_AMBULATORY_CARE_PROVIDER_SITE_OTHER): Payer: PRIVATE HEALTH INSURANCE | Admitting: Medical

## 2011-02-02 VITALS — BP 100/60 | HR 68 | Temp 98.1°F | Ht 61.0 in | Wt 120.0 lb

## 2011-02-02 DIAGNOSIS — M65839 Other synovitis and tenosynovitis, unspecified forearm: Secondary | ICD-10-CM

## 2011-02-02 NOTE — Progress Notes (Signed)
Subjective:   HPI Sharon Bowman is a 35 y.o. female who presents for intermittent pain in her right hand since September 2011. She denies injury or trauma, but just notes that she gets pain along her forearm, hand, and pain into her right middle finger only. Motion makes the pain worse, nothing really helps the pain. She did buy an over-the-counter soft brace that helps some. However in the past week she's had a little bit of swelling in the same area along her middle finger which is new. She works on [employer], but doesn't do a lot of activity with her hands. She is actually left-handed.  No other aggravating or relieving factors.  No other c/o.  The following portions of the patient's history were reviewed and updated as appropriate: allergies, current medications, past family history, past medical history, past social history, past surgical history and problem list.  Past Medical History  Diagnosis Date  . Endometriosis   . PCOS (polycystic ovarian syndrome)   . Interstitial cystitis   . GAD (generalized anxiety disorder)   . Chronic sinusitis     Review of Systems Gen: No fevers, chills, sweats Skin: No rash, erythema, bruising Musculoskeletal: No bony changes, no other joint swelling or pain Neuro: No numbness, weakness, tingling     Objective:   Physical Exam  General appearance: alert, no distress, WD/WN, white female Skin: Hands and arms with no erythema, ecchymosis, or warmth Musculoskeletal: Generalized tenderness to palpation over the right dorsal forearm, mild tenderness on her right third metacarpal and middle finger to the PIP, pain with range of motion of the third finger, mild pain with resisted finger extension, mild pain with resisted wrist extension. Otherwise upper extremities without obvious tenderness or deformity, normal range of motion Neuro: Normal strength and sensation of bilateral hands and fingers    Assessment :    Encounter Diagnosis  Name  Primary?  . Hand tendonitis Yes     Plan:     For the next week, advise she completely rest the right hand, use an arm sling, but periodically move her shoulder around to avoid frozen shoulder, alternate ice and heat 2-3 times a day, use over-the-counter Aleve once to twice a day for the next 3-5 days. If no improvement at all within one to 2 weeks, consider x-ray or other evaluation and management.

## 2011-02-12 ENCOUNTER — Telehealth: Payer: Self-pay | Admitting: *Deleted

## 2011-02-12 NOTE — Telephone Encounter (Signed)
Pt is scheduled with Dr. Magnus Ivan on 02-14-11 at 1:30 pm and pt is aware of appointment.  CM, LPN

## 2011-02-12 NOTE — Telephone Encounter (Signed)
Refer to ortho.

## 2011-02-28 ENCOUNTER — Other Ambulatory Visit (INDEPENDENT_AMBULATORY_CARE_PROVIDER_SITE_OTHER): Payer: PRIVATE HEALTH INSURANCE

## 2011-02-28 DIAGNOSIS — Z23 Encounter for immunization: Secondary | ICD-10-CM

## 2011-03-01 ENCOUNTER — Other Ambulatory Visit (INDEPENDENT_AMBULATORY_CARE_PROVIDER_SITE_OTHER): Payer: PRIVATE HEALTH INSURANCE | Admitting: *Deleted

## 2011-03-01 DIAGNOSIS — IMO0001 Reserved for inherently not codable concepts without codable children: Secondary | ICD-10-CM

## 2011-03-01 DIAGNOSIS — Z309 Encounter for contraceptive management, unspecified: Secondary | ICD-10-CM

## 2011-03-01 MED ORDER — MEDROXYPROGESTERONE ACETATE 150 MG/ML IM SUSP
150.0000 mg | Freq: Once | INTRAMUSCULAR | Status: AC
  Administered 2011-03-01: 150 mg via INTRAMUSCULAR

## 2011-03-26 LAB — DIFFERENTIAL
Basophils Absolute: 0.1
Lymphocytes Relative: 29
Neutro Abs: 9.8 — ABNORMAL HIGH
Neutrophils Relative %: 65

## 2011-03-26 LAB — CBC
Platelets: 361
RDW: 12.5

## 2011-03-26 LAB — PREGNANCY, URINE: Preg Test, Ur: NEGATIVE

## 2011-03-29 LAB — CBC
MCHC: 34.4
RBC: 3.86 — ABNORMAL LOW

## 2011-03-29 LAB — URINALYSIS, ROUTINE W REFLEX MICROSCOPIC
Bilirubin Urine: NEGATIVE
Nitrite: NEGATIVE
Specific Gravity, Urine: 1.025
Urobilinogen, UA: 0.2

## 2011-03-29 LAB — POCT PREGNANCY, URINE
Operator id: 12087
Preg Test, Ur: NEGATIVE

## 2011-04-03 LAB — URINALYSIS, ROUTINE W REFLEX MICROSCOPIC
Bilirubin Urine: NEGATIVE
Ketones, ur: NEGATIVE
Nitrite: NEGATIVE
Protein, ur: NEGATIVE
pH: 5.5

## 2011-04-03 LAB — URINE CULTURE

## 2011-04-03 LAB — POCT PREGNANCY, URINE: Preg Test, Ur: NEGATIVE

## 2011-04-20 ENCOUNTER — Other Ambulatory Visit: Payer: Self-pay | Admitting: Orthopaedic Surgery

## 2011-04-20 DIAGNOSIS — M79631 Pain in right forearm: Secondary | ICD-10-CM

## 2011-04-24 ENCOUNTER — Other Ambulatory Visit: Payer: PRIVATE HEALTH INSURANCE

## 2011-05-16 ENCOUNTER — Ambulatory Visit
Admission: RE | Admit: 2011-05-16 | Discharge: 2011-05-16 | Disposition: A | Discharge status: Home / Self Care | Payer: PRIVATE HEALTH INSURANCE | Source: Ambulatory Visit | Attending: Orthopaedic Surgery | Admitting: Orthopaedic Surgery

## 2011-05-16 DIAGNOSIS — M79631 Pain in right forearm: Secondary | ICD-10-CM

## 2011-05-23 ENCOUNTER — Ambulatory Visit: Payer: PRIVATE HEALTH INSURANCE

## 2011-05-31 ENCOUNTER — Ambulatory Visit (INDEPENDENT_AMBULATORY_CARE_PROVIDER_SITE_OTHER): Payer: PRIVATE HEALTH INSURANCE | Admitting: *Deleted

## 2011-05-31 DIAGNOSIS — Z3049 Encounter for surveillance of other contraceptives: Secondary | ICD-10-CM

## 2011-05-31 DIAGNOSIS — Z30019 Encounter for initial prescription of contraceptives, unspecified: Secondary | ICD-10-CM

## 2011-05-31 MED ORDER — MEDROXYPROGESTERONE ACETATE 150 MG/ML IM SUSP
150.0000 mg | Freq: Once | INTRAMUSCULAR | Status: AC
  Administered 2011-05-31: 150 mg via INTRAMUSCULAR

## 2011-07-16 ENCOUNTER — Telehealth: Payer: Self-pay | Admitting: *Deleted

## 2011-07-16 ENCOUNTER — Encounter: Payer: Self-pay | Admitting: *Deleted

## 2011-07-17 NOTE — Telephone Encounter (Signed)
Patient needs letter for work.

## 2011-08-23 ENCOUNTER — Ambulatory Visit: Payer: PRIVATE HEALTH INSURANCE

## 2011-08-24 ENCOUNTER — Ambulatory Visit (INDEPENDENT_AMBULATORY_CARE_PROVIDER_SITE_OTHER): Payer: PRIVATE HEALTH INSURANCE | Admitting: Medical

## 2011-08-24 ENCOUNTER — Encounter: Payer: Self-pay | Admitting: Family Medicine

## 2011-08-24 ENCOUNTER — Encounter: Payer: Self-pay | Admitting: Medical

## 2011-08-24 VITALS — BP 92/70 | HR 68 | Temp 98.7°F | Resp 16 | Wt 111.0 lb

## 2011-08-24 DIAGNOSIS — J029 Acute pharyngitis, unspecified: Secondary | ICD-10-CM

## 2011-08-24 DIAGNOSIS — F17200 Nicotine dependence, unspecified, uncomplicated: Secondary | ICD-10-CM

## 2011-08-24 DIAGNOSIS — R05 Cough: Secondary | ICD-10-CM

## 2011-08-24 DIAGNOSIS — R062 Wheezing: Secondary | ICD-10-CM

## 2011-08-24 LAB — POCT RAPID STREP A (OFFICE): Rapid Strep A Screen: NEGATIVE

## 2011-08-24 MED ORDER — AZITHROMYCIN 500 MG PO TABS: 500.0000 mg | ORAL_TABLET | Freq: Every day | ORAL | Status: AC

## 2011-08-24 NOTE — Patient Instructions (Signed)
You appear to have a viral respiratory infection.  Recommendations include salt water gargles, warm fluids such as tea and coffee or honey and lemon mixture, Ibuprofen OTC 200mg , 3-4 tablets every 6 hours as needed for throat pain, sore throat spray OTC.  For cough and congestion, I would use Mucinex DM, Robitussin DM or similar.   Rest, drink plenty of fluids, consider putting the cigarette down.  If worse with fever over 101 or much worse chest symptom over the weekend, then you may use the antibiotic script given.  YOU CAN QUIT SMOKING!  Talk to your medical provider about using medicines to help you quit. These include nicotine replacement gum, lozenges, or skin patches.  Consider calling 1-800-QUIT-NOW, a toll free 24/7 hotline with free counseling to help you quit.  If you are ready to quit smoking or are thinking about it, congratulations! You have chosen to help yourself be healthier and live longer! There are lots of different ways to quit smoking. Nicotine gum, nicotine patches, a nicotine inhaler, or nicotine nasal spray can help with physical craving. Hypnosis, support groups, and medicines help break the habit of smoking. TIPS TO GET OFF AND STAY OFF CIGARETTES  Learn to predict your moods. Do not let a bad situation be your excuse to have a cigarette. Some situations in your life might tempt you to have a cigarette.   Ask friends and co-workers not to smoke around you.   Make your home smoke-free.   Never have "just one" cigarette. It leads to wanting another and another. Remind yourself of your decision to quit.   On a card, make a list of your reasons for not smoking. Read it at least the same number of times a day as you have a cigarette. Tell yourself everyday, "I do not want to smoke. I choose not to smoke."   Ask someone at home or work to help you with your plan to quit smoking.   Have something planned after you eat or have a cup of coffee. Take a walk or get other  exercise to perk you up. This will help to keep you from overeating.   Try a relaxation exercise to calm you down and decrease your stress. Remember, you may be tense and nervous the first two weeks after you quit. This will pass.   Find new activities to keep your hands busy. Play with a pen, coin, or rubber band. Doodle or draw things on paper.   Brush your teeth right after eating. This will help cut down the craving for the taste of tobacco after meals. You can try mouthwash too.   Try gum, breath mints, or diet candy to keep something in your mouth.  IF YOU SMOKE AND WANT TO QUIT:  Do not stock up on cigarettes. Never buy a carton. Wait until one pack is finished before you buy another.   Never carry cigarettes with you at work or at home.   Keep cigarettes as far away from you as possible. Leave them with someone else.   Never carry matches or a lighter with you.   Ask yourself, "Do I need this cigarette or is this just a reflex?"   Bet with someone that you can quit. Put cigarette money in a piggy bank every morning. If you smoke, you give up the money. If you do not smoke, by the end of the week, you keep the money.   Keep trying. It takes 21 days to change a habit!  Document Released: 04/14/2009 Document Revised: 02/28/2011 Document Reviewed: 04/14/2009 [facility] Patient Information 2012 [place], [place].

## 2011-08-24 NOTE — Progress Notes (Signed)
Subjective:  Sharon Bowman is a 36 y.o. female who presents for 1 day hx/o sore throat, some cough, with green phlegm, throat feels warm.   Subjective fever, chills.  Denies runny nose, nasal congestion, no nausea, vomiting, ear pain.   +sick contacts at work.  She has not had a recent close exposure to someone with proven streptococcal pharyngitis.  Using some cough drops.  She is a smoker.  Recently had dental surgery, and was put on Penicillin 08/05/11.  Was put on different antibiotic by endodontist.  Had some left and restarted this yesterday.    Past Medical History  Diagnosis Date  . Endometriosis   . PCOS (polycystic ovarian syndrome)   . Interstitial cystitis   . GAD (generalized anxiety disorder)   . Chronic sinusitis    ROS:  Gen: +subj fever, chills GI: no pain, NVD Lungs: no SOB, wheezing Heart: no CP, palpitations    Objective:      Filed Vitals:   08/24/11 0857  BP: 92/70  Pulse: 68  Temp: 98.7 F (37.1 C)  Resp: 16    General appearance: no distress, WD/WN, mildly ill-appearing HEENT: normocephalic, conjunctiva/corneas normal, sclerae anicteric, nares patent, no discharge or erythema, pharynx with erythema,no exudate.  Oral cavity: MMM, no lesions  Neck: supple, shoddy lymphadenopathy, no thyromegaly Heart: RRR, normal S1, S2, no murmurs Lungs: few faint  wheezes, no rhonchi, or rales  Laboratory Strep test done. Results:negative.    Assessment and Plan:   Encounter Diagnoses  Name Primary?  . Pharyngitis Yes  . Cough   . Wheeze   . Tobacco use disorder     Advised that symptoms and exam suggest a viral etiology.  Discussed symptomatic treatment including salt water gargles, warm fluids, rest, hydrate well, can use over-the-counter Ibuprofen for throat pain, fever, or malaise, Mucinex DM for cough/congestion. If worse or not improving within 2-3 days, call or return. If fever over 101, worse chest symptoms over weekend, can use script for  Azithromycin given.   Discussion usual course of illness.

## 2011-08-29 ENCOUNTER — Ambulatory Visit (INDEPENDENT_AMBULATORY_CARE_PROVIDER_SITE_OTHER): Payer: PRIVATE HEALTH INSURANCE | Admitting: *Deleted

## 2011-08-29 DIAGNOSIS — Z308 Encounter for other contraceptive management: Secondary | ICD-10-CM

## 2011-08-29 DIAGNOSIS — Z30019 Encounter for initial prescription of contraceptives, unspecified: Secondary | ICD-10-CM

## 2011-08-29 MED ORDER — MEDROXYPROGESTERONE ACETATE 150 MG/ML IM SUSP
150.0000 mg | INTRAMUSCULAR | Status: AC
  Administered 2011-08-29 – 2012-02-29 (×3): 150 mg via INTRAMUSCULAR

## 2011-08-29 NOTE — Progress Notes (Signed)
Patient is here for her Depo Provera, she is doing well.

## 2011-10-09 ENCOUNTER — Ambulatory Visit (INDEPENDENT_AMBULATORY_CARE_PROVIDER_SITE_OTHER): Payer: PRIVATE HEALTH INSURANCE | Admitting: Family Medicine

## 2011-10-09 ENCOUNTER — Encounter: Payer: Self-pay | Admitting: Family Medicine

## 2011-10-09 VITALS — BP 109/71 | HR 83 | Ht 61.0 in | Wt 111.0 lb

## 2011-10-09 DIAGNOSIS — IMO0002 Reserved for concepts with insufficient information to code with codable children: Secondary | ICD-10-CM

## 2011-10-09 DIAGNOSIS — Q613 Polycystic kidney, unspecified: Secondary | ICD-10-CM

## 2011-10-09 DIAGNOSIS — N803 Endometriosis of pelvic peritoneum: Secondary | ICD-10-CM

## 2011-10-09 MED ORDER — MISOPROSTOL 200 MCG PO TABS: 200.0000 ug | ORAL_TABLET | Freq: Once | ORAL | Status: DC

## 2011-10-09 NOTE — Progress Notes (Signed)
  Subjective:    Patient ID: Sharon Bowman, female    DOB: [birth year], 36 y.o.   MRN: [id number]  HPI Here today.  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  She is having continued pain.  She has been resistant to hysterectomy in the past and is still so today.  Was on continuous OC's until age 42, being a smoker she was no longer eligible.  She is now on Depo.  She is questioned about child bearing and states that she has a 50% chance of passing PCKD to her offspring and is unwilling to do that.  She is also single at present.  She is having rectal pain and lower abdominal pain.  It is continuous.  She is working, uses Ultram for pain.  She has h/o interstitial cystitis and has done some bladder treatments which are helping some.  She is tired of being in pain.  She was prev. Pt at [facility] and her hysterectomy was booked there and she was cancelled by her urologist.  They were adamant that they be present for the surgery given her kidney disease and involvement of her ureters in the disease process.   Review of Systems  Constitutional: Negative for fever and fatigue.  Gastrointestinal: Positive for diarrhea and rectal pain. Negative for nausea and vomiting.  Genitourinary: Positive for pelvic pain. Negative for dysuria.       Objective:   Physical Exam  Vitals reviewed. Constitutional: She appears well-developed and well-nourished.  HENT:  Head: Normocephalic and atraumatic.  Cardiovascular: Normal rate.   Abdominal: Soft.          Assessment & Plan:   1. Polycystic kidney disease    2. Endometriosis of pelvis  misoprostol (CYTOTEC) 200 MCG tablet   Lengthy discussion was had with the pt. About options.  We discussed robotic approach, open approach, referral to REI.  We discussed childbearing, Neurontin, IUD and other temporizing measures.  We discussed likelihood of success of treatment and alternatives  including surgery.  We discussed risks associated with surgery including risk of damage to bowel and bladder and ureters and that risk with PCKD, possible need to perform surgery at [facility], or other endometriosis specialist.  We discussed removal of ovaries, implications for long term health after that.  After careful consideration, she elected a trial of IUD.  Will consider continuation of Depo if needed and likley will need cytotec to open cervix.

## 2011-10-09 NOTE — Patient Instructions (Signed)
Endometriosis Endometriosis is a disease that occurs when the endometrium (lining of the uterus) is misplaced outside of its normal location. It may occur in many locations close to the uterus (womb), but commonly on the ovaries, fallopian tubes, vagina (birth canal) and bowel located close to the uterus. Because the uterus sloughs (expels) its lining every month (menses), there is bleeding whereever the endometrial tissue is located. SYMPTOMS  Often there are no symptoms. However, because blood is irritating to tissues not normally exposed to it, when symptoms occur they vary with the location of the misplaced endometrium. Symptoms often include back and abdominal pain. Periods may be heavier and intercourse may be painful. Infertility may be present. You may have all of these symptoms at one time or another or you may have months with no symptoms at all. Although the symptoms occur mainly during menses, they can occur mid-cycle as well, and usually terminate with menopause. DIAGNOSIS  Your caregiver may recommend a blood test and urine test (urinalysis) to help rule out other conditions. Another common test is ultrasound, a painless procedure that uses sound waves to make a sonogram "picture" of abnormal tissue that could be endometriosis. If your bowel movements are painful around your periods, your caregiver may advise a barium enema (an X-ray of the lower bowel), to try to find the source of your pain. This is sometimes confirmed by laparoscopy. Laparoscopy is a procedure where your caregiver looks into your abdomen with a laparoscope (a small pencil sized telescope). Your caregiver may take a tiny piece of tissue (biopsy) from any abnormal tissue to confirm or document your problem. These tissues are sent to the lab and a pathologist looks at them under the microscope to give a microscopic diagnosis. TREATMENT  Once the diagnosis is made, it can be treated by destruction of the misplaced endometrial  tissue using heat (diathermy), laser, cutting (excision), or chemical means. It may also be treated with hormonal therapy. When using hormonal therapy menses are eliminated, therefore eliminating the monthly exposure to blood by the misplaced endometrial tissue. Only in severe cases is it necessary to perform a hysterectomy with removal of the tubes, uterus and ovaries. HOME CARE INSTRUCTIONS   Only take over-the-counter or prescription medicines for pain, discomfort, or fever as directed by your caregiver.   Avoid activities that produce pain, including a physical sexual relationship.   Do not take aspirin as this may increase bleeding when not on hormonal therapy.   See your caregiver for pain or problems not controlled with treatment.  SEEK IMMEDIATE MEDICAL CARE IF:   Your pain is severe and is not responding to pain medicine.   You develop severe nausea and vomiting, or you cannot keep foods down.   Your pain localizes to the right lower part of your abdomen (possible appendicitis).   You have swelling or increasing pain in the abdomen.   You have a fever.   You see blood in your stool.  MAKE SURE YOU:   Understand these instructions.   Will watch your condition.   Will get help right away if you are not doing well or get worse.  Document Released: 06/15/2000 Document Revised: 06/07/2011 Document Reviewed: 02/04/2008 [employer] Patient Information 2012 [employer]. 

## 2011-10-11 ENCOUNTER — Telehealth: Payer: Self-pay | Admitting: *Deleted

## 2011-10-11 DIAGNOSIS — R102 Pelvic and perineal pain: Secondary | ICD-10-CM

## 2011-10-11 MED ORDER — HYDROCODONE-ACETAMINOPHEN 7.5-750 MG PO TABS: 1.0000 | ORAL_TABLET | Freq: Three times a day (TID) | ORAL | Status: DC | PRN

## 2011-10-11 NOTE — Telephone Encounter (Signed)
After leaving her appointment patient decided that she did need the prescription for pain medication that was offered to her.  I called in number [phone] with no refills.

## 2011-10-15 ENCOUNTER — Ambulatory Visit (INDEPENDENT_AMBULATORY_CARE_PROVIDER_SITE_OTHER): Payer: PRIVATE HEALTH INSURANCE | Admitting: Family Medicine

## 2011-10-15 ENCOUNTER — Encounter: Payer: Self-pay | Admitting: Family Medicine

## 2011-10-15 VITALS — BP 118/68 | HR 110 | Ht 61.0 in | Wt 113.0 lb

## 2011-10-15 DIAGNOSIS — IMO0002 Reserved for concepts with insufficient information to code with codable children: Secondary | ICD-10-CM

## 2011-10-15 DIAGNOSIS — Z01812 Encounter for preprocedural laboratory examination: Secondary | ICD-10-CM

## 2011-10-15 DIAGNOSIS — N803 Endometriosis of pelvic peritoneum: Secondary | ICD-10-CM

## 2011-10-15 DIAGNOSIS — N949 Unspecified condition associated with female genital organs and menstrual cycle: Secondary | ICD-10-CM

## 2011-10-15 DIAGNOSIS — G8929 Other chronic pain: Secondary | ICD-10-CM

## 2011-10-15 DIAGNOSIS — Z30430 Encounter for insertion of intrauterine contraceptive device: Secondary | ICD-10-CM

## 2011-10-15 LAB — POCT URINE PREGNANCY: Preg Test, Ur: NEGATIVE

## 2011-10-15 MED ORDER — HYDROCODONE-ACETAMINOPHEN 10-325 MG PO TABS
1.0000 | ORAL_TABLET | Freq: Four times a day (QID) | ORAL | Status: AC | PRN
Start: 2011-10-15 — End: 2011-10-25

## 2011-10-15 NOTE — Progress Notes (Signed)
Patient is here for Mirena IUD insertion.

## 2011-10-15 NOTE — Progress Notes (Signed)
History:: Long h/o endometriosis.  Desires no hysterectomy.  Trial of IUD.  Currently on Depo as she is a smoker and no longer qualifies for OC's.  Physical exam: Filed Vitals:   10/15/11 1523  BP: 118/68  Pulse: 110   Gen:  WD/WN female NAD  Procedure: Patient identified, informed consent performed, signed copy in chart, time out was performed.  Urine pregnancy test negative.  Speculum placed in the vagina.  Cervix visualized.  Cleaned with Betadine x 2.  Grasped anteriourly with a single tooth tenaculum.  Uterus sounded to 6 cm.  Mirena IUD placed per manufacturer's recommendations.  Strings trimmed to 3 cm.   Patient given post procedure instructions and Mirena care card with expiration date.  Patient is asked to check IUD strings periodically and follow up in 4-6 weeks for IUD check.   Assessment: IUD insertion Endometriosis  Plan: F/u 1 month for re-check and see if pain is improved.

## 2011-10-15 NOTE — Patient Instructions (Signed)

## 2011-11-28 ENCOUNTER — Ambulatory Visit: Payer: PRIVATE HEALTH INSURANCE

## 2011-11-29 ENCOUNTER — Encounter: Payer: Self-pay | Admitting: Obstetrics & Gynecology

## 2011-11-29 ENCOUNTER — Ambulatory Visit (INDEPENDENT_AMBULATORY_CARE_PROVIDER_SITE_OTHER): Payer: PRIVATE HEALTH INSURANCE | Admitting: Obstetrics & Gynecology

## 2011-11-29 VITALS — BP 124/78 | HR 101 | Ht 61.0 in | Wt 111.0 lb

## 2011-11-29 DIAGNOSIS — N803 Endometriosis of pelvic peritoneum: Secondary | ICD-10-CM

## 2011-11-29 DIAGNOSIS — Z124 Encounter for screening for malignant neoplasm of cervix: Secondary | ICD-10-CM

## 2011-11-29 DIAGNOSIS — Z113 Encounter for screening for infections with a predominantly sexual mode of transmission: Secondary | ICD-10-CM

## 2011-11-29 DIAGNOSIS — Z3049 Encounter for surveillance of other contraceptives: Secondary | ICD-10-CM

## 2011-11-29 DIAGNOSIS — Z01419 Encounter for gynecological examination (general) (routine) without abnormal findings: Secondary | ICD-10-CM

## 2011-11-29 NOTE — Patient Instructions (Signed)
Preventive Care for Adults, Female A healthy lifestyle and preventive care can promote health and wellness. Preventive health guidelines for women include the following key practices.  A routine yearly physical is a good way to check with your caregiver about your health and preventive screening. It is a chance to share any concerns and updates on your health, and to receive a thorough exam.   Visit your dentist for a routine exam and preventive care every 6 months. Brush your teeth twice a day and floss once a day. Good oral hygiene prevents tooth decay and gum disease.   The frequency of eye exams is based on your age, health, family medical history, use of contact lenses, and other factors. Follow your caregiver's recommendations for frequency of eye exams.   Eat a healthy diet. Foods like vegetables, fruits, whole grains, low-fat dairy products, and lean protein foods contain the nutrients you need without too many calories. Decrease your intake of foods high in solid fats, added sugars, and salt. Eat the right amount of calories for you.Get information about a proper diet from your caregiver, if necessary.   Regular physical exercise is one of the most important things you can do for your health. Most adults should get at least 150 minutes of moderate-intensity exercise (any activity that increases your heart rate and causes you to sweat) each week. In addition, most adults need muscle-strengthening exercises on 2 or more days a week.   Maintain a healthy weight. The body mass index (BMI) is a screening tool to identify possible weight problems. It provides an estimate of body fat based on height and weight. Your caregiver can help determine your BMI, and can help you achieve or maintain a healthy weight.For adults 20 years and older:   A BMI below 18.5 is considered underweight.   A BMI of 18.5 to 24.9 is normal.   A BMI of 25 to 29.9 is considered overweight.   A BMI of 30 and above is  considered obese.   Maintain normal blood lipids and cholesterol levels by exercising and minimizing your intake of saturated fat. Eat a balanced diet with plenty of fruit and vegetables. Blood tests for lipids and cholesterol should begin at age 20 and be repeated every 5 years. If your lipid or cholesterol levels are high, you are over 50, or you are at high risk for heart disease, you may need your cholesterol levels checked more frequently.Ongoing high lipid and cholesterol levels should be treated with medicines if diet and exercise are not effective.   If you smoke, find out from your caregiver how to quit. If you do not use tobacco, do not start.   If you are pregnant, do not drink alcohol. If you are breastfeeding, be very cautious about drinking alcohol. If you are not pregnant and choose to drink alcohol, do not exceed 1 drink per day. One drink is considered to be 12 ounces (355 mL) of beer, 5 ounces (148 mL) of wine, or 1.5 ounces (44 mL) of liquor.   Avoid use of street drugs. Do not share needles with anyone. Ask for help if you need support or instructions about stopping the use of drugs.   High blood pressure causes heart disease and increases the risk of stroke. Your blood pressure should be checked at least every 1 to 2 years. Ongoing high blood pressure should be treated with medicines if weight loss and exercise are not effective.   If you are 55 to 36   years old, ask your caregiver if you should take aspirin to prevent strokes.   Diabetes screening involves taking a blood sample to check your fasting blood sugar level. This should be done once every 3 years, after age 45, if you are within normal weight and without risk factors for diabetes. Testing should be considered at a younger age or be carried out more frequently if you are overweight and have at least 1 risk factor for diabetes.   Breast cancer screening is essential preventive care for women. You should practice "breast  self-awareness." This means understanding the normal appearance and feel of your breasts and may include breast self-examination. Any changes detected, no matter how small, should be reported to a caregiver. Women in their 20s and 30s should have a clinical breast exam (CBE) by a caregiver as part of a regular health exam every 1 to 3 years. After age 40, women should have a CBE every year. Starting at age 40, women should consider having a mammography (breast X-ray test) every year. Women who have a family history of breast cancer should talk to their caregiver about genetic screening. Women at a high risk of breast cancer should talk to their caregivers about having magnetic resonance imaging (MRI) and a mammography every year.   The Pap test is a screening test for cervical cancer. A Pap test can show cell changes on the cervix that might become cervical cancer if left untreated. A Pap test is a procedure in which cells are obtained and examined from the lower end of the uterus (cervix).   Women should have a Pap test starting at age 21.   Between ages 21 and 29, Pap tests should be repeated every 2 years.   Beginning at age 30, you should have a Pap test every 3 years as long as the past 3 Pap tests have been normal.   Some women have medical problems that increase the chance of getting cervical cancer. Talk to your caregiver about these problems. It is especially important to talk to your caregiver if a new problem develops soon after your last Pap test. In these cases, your caregiver may recommend more frequent screening and Pap tests.   The above recommendations are the same for women who have or have not gotten the vaccine for human papillomavirus (HPV).   If you had a hysterectomy for a problem that was not cancer or a condition that could lead to cancer, then you no longer need Pap tests. Even if you no longer need a Pap test, a regular exam is a good idea to make sure no other problems are  starting.   If you are between ages 65 and 70, and you have had normal Pap tests going back 10 years, you no longer need Pap tests. Even if you no longer need a Pap test, a regular exam is a good idea to make sure no other problems are starting.   If you have had past treatment for cervical cancer or a condition that could lead to cancer, you need Pap tests and screening for cancer for at least 20 years after your treatment.   If Pap tests have been discontinued, risk factors (such as a new sexual partner) need to be reassessed to determine if screening should be resumed.   The HPV test is an additional test that may be used for cervical cancer screening. The HPV test looks for the virus that can cause the cell changes on the cervix.   The cells collected during the Pap test can be tested for HPV. The HPV test could be used to screen women aged 30 years and older, and should be used in women of any age who have unclear Pap test results. After the age of 30, women should have HPV testing at the same frequency as a Pap test.   Colorectal cancer can be detected and often prevented. Most routine colorectal cancer screening begins at the age of 50 and continues through age 75. However, your caregiver may recommend screening at an earlier age if you have risk factors for colon cancer. On a yearly basis, your caregiver may provide home test kits to check for hidden blood in the stool. Use of a small camera at the end of a tube, to directly examine the colon (sigmoidoscopy or colonoscopy), can detect the earliest forms of colorectal cancer. Talk to your caregiver about this at age 50, when routine screening begins. Direct examination of the colon should be repeated every 5 to 10 years through age 75, unless early forms of pre-cancerous polyps or small growths are found.   Hepatitis C blood testing is recommended for all people born from 1945 through 1965 and any individual with known risks for hepatitis C.    Practice safe sex. Use condoms and avoid high-risk sexual practices to reduce the spread of sexually transmitted infections (STIs). STIs include gonorrhea, chlamydia, syphilis, trichomonas, herpes, HPV, and human immunodeficiency virus (HIV). Herpes, HIV, and HPV are viral illnesses that have no cure. They can result in disability, cancer, and death. Sexually active women aged 25 and younger should be checked for chlamydia. Older women with new or multiple partners should also be tested for chlamydia. Testing for other STIs is recommended if you are sexually active and at increased risk.   Osteoporosis is a disease in which the bones lose minerals and strength with aging. This can result in serious bone fractures. The risk of osteoporosis can be identified using a bone density scan. Women ages 65 and over and women at risk for fractures or osteoporosis should discuss screening with their caregivers. Ask your caregiver whether you should take a calcium supplement or vitamin D to reduce the rate of osteoporosis.   Menopause can be associated with physical symptoms and risks. Hormone replacement therapy is available to decrease symptoms and risks. You should talk to your caregiver about whether hormone replacement therapy is right for you.   Use sunscreen with sun protection factor (SPF) of 30 or more. Apply sunscreen liberally and repeatedly throughout the day. You should seek shade when your shadow is shorter than you. Protect yourself by wearing long sleeves, pants, a wide-brimmed hat, and sunglasses year round, whenever you are outdoors.   Once a month, do a whole body skin exam, using a mirror to look at the skin on your back. Notify your caregiver of new moles, moles that have irregular borders, moles that are larger than a pencil eraser, or moles that have changed in shape or color.   Stay current with required immunizations.   Influenza. You need a dose every fall (or winter). The composition of  the flu vaccine changes each year, so being vaccinated once is not enough.   Pneumococcal polysaccharide. You need 1 to 2 doses if you smoke cigarettes or if you have certain chronic medical conditions. You need 1 dose at age 65 (or older) if you have never been vaccinated.   Tetanus, diphtheria, pertussis (Tdap, Td). Get 1 dose of   Tdap vaccine if you are younger than age 65, are over 65 and have contact with an infant, are a healthcare worker, are pregnant, or simply want to be protected from whooping cough. After that, you need a Td booster dose every 10 years. Consult your caregiver if you have not had at least 3 tetanus and diphtheria-containing shots sometime in your life or have a deep or dirty wound.   HPV. You need this vaccine if you are a woman age 26 or younger. The vaccine is given in 3 doses over 6 months.   Measles, mumps, rubella (MMR). You need at least 1 dose of MMR if you were born in 1957 or later. You may also need a second dose.   Meningococcal. If you are age 19 to 21 and a first-year college student living in a residence hall, or have one of several medical conditions, you need to get vaccinated against meningococcal disease. You may also need additional booster doses.   Zoster (shingles). If you are age 60 or older, you should get this vaccine.   Varicella (chickenpox). If you have never had chickenpox or you were vaccinated but received only 1 dose, talk to your caregiver to find out if you need this vaccine.   Hepatitis A. You need this vaccine if you have a specific risk factor for hepatitis A virus infection or you simply wish to be protected from this disease. The vaccine is usually given as 2 doses, 6 to 18 months apart.   Hepatitis B. You need this vaccine if you have a specific risk factor for hepatitis B virus infection or you simply wish to be protected from this disease. The vaccine is given in 3 doses, usually over 6 months.  Preventive Services /  Frequency Ages 19 to 39  Blood pressure check.** / Every 1 to 2 years.   Lipid and cholesterol check.** / Every 5 years beginning at age 20.   Clinical breast exam.** / Every 3 years for women in their 20s and 30s.   Pap test.** / Every 2 years from ages 21 through 29. Every 3 years starting at age 30 through age 65 or 70 with a history of 3 consecutive normal Pap tests.   HPV screening.** / Every 3 years from ages 30 through ages 65 to 70 with a history of 3 consecutive normal Pap tests.   Hepatitis C blood test.** / For any individual with known risks for hepatitis C.   Skin self-exam. / Monthly.   Influenza immunization.** / Every year.   Pneumococcal polysaccharide immunization.** / 1 to 2 doses if you smoke cigarettes or if you have certain chronic medical conditions.   Tetanus, diphtheria, pertussis (Tdap, Td) immunization. / A one-time dose of Tdap vaccine. After that, you need a Td booster dose every 10 years.   HPV immunization. / 3 doses over 6 months, if you are 26 and younger.   Measles, mumps, rubella (MMR) immunization. / You need at least 1 dose of MMR if you were born in 1957 or later. You may also need a second dose.   Meningococcal immunization. / 1 dose if you are age 19 to 21 and a first-year college student living in a residence hall, or have one of several medical conditions, you need to get vaccinated against meningococcal disease. You may also need additional booster doses.   Varicella immunization.** / Consult your caregiver.   Hepatitis A immunization.** / Consult your caregiver. 2 doses, 6 to 18 months   apart.   Hepatitis B immunization.** / Consult your caregiver. 3 doses usually over 6 months.  Ages 40 to 64  Blood pressure check.** / Every 1 to 2 years.   Lipid and cholesterol check.** / Every 5 years beginning at age 20.   Clinical breast exam.** / Every year after age 40.   Mammogram.** / Every year beginning at age 40 and continuing for as  long as you are in good health. Consult with your caregiver.   Pap test.** / Every 3 years starting at age 30 through age 65 or 70 with a history of 3 consecutive normal Pap tests.   HPV screening.** / Every 3 years from ages 30 through ages 65 to 70 with a history of 3 consecutive normal Pap tests.   Fecal occult blood test (FOBT) of stool. / Every year beginning at age 50 and continuing until age 75. You may not need to do this test if you get a colonoscopy every 10 years.   Flexible sigmoidoscopy or colonoscopy.** / Every 5 years for a flexible sigmoidoscopy or every 10 years for a colonoscopy beginning at age 50 and continuing until age 75.   Hepatitis C blood test.** / For all people born from 1945 through 1965 and any individual with known risks for hepatitis C.   Skin self-exam. / Monthly.   Influenza immunization.** / Every year.   Pneumococcal polysaccharide immunization.** / 1 to 2 doses if you smoke cigarettes or if you have certain chronic medical conditions.   Tetanus, diphtheria, pertussis (Tdap, Td) immunization.** / A one-time dose of Tdap vaccine. After that, you need a Td booster dose every 10 years.   Measles, mumps, rubella (MMR) immunization. / You need at least 1 dose of MMR if you were born in 1957 or later. You may also need a second dose.   Varicella immunization.** / Consult your caregiver.   Meningococcal immunization.** / Consult your caregiver.   Hepatitis A immunization.** / Consult your caregiver. 2 doses, 6 to 18 months apart.   Hepatitis B immunization.** / Consult your caregiver. 3 doses, usually over 6 months.  Ages 65 and over  Blood pressure check.** / Every 1 to 2 years.   Lipid and cholesterol check.** / Every 5 years beginning at age 20.   Clinical breast exam.** / Every year after age 40.   Mammogram.** / Every year beginning at age 40 and continuing for as long as you are in good health. Consult with your caregiver.   Pap test.** /  Every 3 years starting at age 30 through age 65 or 70 with a 3 consecutive normal Pap tests. Testing can be stopped between 65 and 70 with 3 consecutive normal Pap tests and no abnormal Pap or HPV tests in the past 10 years.   HPV screening.** / Every 3 years from ages 30 through ages 65 or 70 with a history of 3 consecutive normal Pap tests. Testing can be stopped between 65 and 70 with 3 consecutive normal Pap tests and no abnormal Pap or HPV tests in the past 10 years.   Fecal occult blood test (FOBT) of stool. / Every year beginning at age 50 and continuing until age 75. You may not need to do this test if you get a colonoscopy every 10 years.   Flexible sigmoidoscopy or colonoscopy.** / Every 5 years for a flexible sigmoidoscopy or every 10 years for a colonoscopy beginning at age 50 and continuing until age 75.   Hepatitis   C blood test.** / For all people born from 1945 through 1965 and any individual with known risks for hepatitis C.   Osteoporosis screening.** / A one-time screening for women ages 65 and over and women at risk for fractures or osteoporosis.   Skin self-exam. / Monthly.   Influenza immunization.** / Every year.   Pneumococcal polysaccharide immunization.** / 1 dose at age 65 (or older) if you have never been vaccinated.   Tetanus, diphtheria, pertussis (Tdap, Td) immunization. / A one-time dose of Tdap vaccine if you are over 65 and have contact with an infant, are a healthcare worker, or simply want to be protected from whooping cough. After that, you need a Td booster dose every 10 years.   Varicella immunization.** / Consult your caregiver.   Meningococcal immunization.** / Consult your caregiver.   Hepatitis A immunization.** / Consult your caregiver. 2 doses, 6 to 18 months apart.   Hepatitis B immunization.** / Check with your caregiver. 3 doses, usually over 6 months.  ** Family history and personal history of risk and conditions may change your caregiver's  recommendations. Document Released: 08/14/2001 Document Revised: 06/07/2011 Document Reviewed: 11/13/2010 [employer] Patient Information 2012 [employer]. 

## 2011-11-29 NOTE — Progress Notes (Signed)
  Subjective:     Sharon Bowman is a 36 y.o. [id number] female and is here for a comprehensive gynecologic exam. She has a history of endometriosis and had extensive counseling about management.  She had a Mirena IUD placed last month, and also wants to continue Depo Provera; see Dr. Tawni Levy notes for details.  The patient reports no other problems. Desires annual STI screen.  History   Social History  . Marital Status: Single    Spouse Name: N/A    Number of Children: N/A  . Years of Education: N/A   Occupational History  . Not on file.   Social History Main Topics  . Smoking status: Current Everyday Smoker -- 1.0 packs/day    Types: Cigarettes  . Smokeless tobacco: Not on file  . Alcohol Use: No  . Drug Use: Yes     marijuana use daily  . Sexually Active: No   Other Topics Concern  . Not on file   Social History Narrative  . No narrative on file   Health Maintenance  Topic Date Due  . Pap Smear  01/28/1994  . Tetanus/tdap  01/29/1995  . Influenza Vaccine  04/01/2012    The following portions of the patient's history were reviewed and updated as appropriate: allergies, current medications, past family history, past medical history, past social history, past surgical history and problem list.  Review of Systems A comprehensive review of systems was negative.   Objective:   Blood pressure 124/78, pulse 101, height 5\' 1"  (1.549 m), weight 111 lb (50.349 kg). GENERAL: Well-developed, well-nourished female in no acute distress.  HEENT: Normocephalic, atraumatic. Sclerae anicteric.  NECK: Supple. Normal thyroid.  LUNGS: Clear to auscultation bilaterally.  HEART: Regular rate and rhythm. BREASTS: Symmetric with everted nipples. No masses, skin changes, nipple drainage, or lymphadenopathy. ABDOMEN: Soft, nontender, nondistended. No organomegaly. PELVIC: Normal external female genitalia. Vagina is pink and rugated.  Normal discharge. Normal cervix contour, IUD strings seen.  Pap smear obtained. Uterus is normal in size. No adnexal mass or tenderness.  EXTREMITIES: No cyanosis, clubbing, or edema, 2+ distal pulses.   Assessment:   Healthy female exam. Desires STI screen. Endometriosis on hormone therapy  Plan:    Will get Depo Provera today. Will continue to consider surgical management. Follow up pap and STI screen  Return to clinic for any GYN concerns.

## 2011-11-30 LAB — HEPATITIS C ANTIBODY: HCV Ab: NEGATIVE

## 2011-11-30 LAB — HIV ANTIBODY (ROUTINE TESTING W REFLEX): HIV: NONREACTIVE

## 2012-02-27 ENCOUNTER — Ambulatory Visit: Payer: PRIVATE HEALTH INSURANCE

## 2012-02-29 ENCOUNTER — Ambulatory Visit (INDEPENDENT_AMBULATORY_CARE_PROVIDER_SITE_OTHER): Payer: PRIVATE HEALTH INSURANCE | Admitting: Gynecology

## 2012-02-29 DIAGNOSIS — Z3049 Encounter for surveillance of other contraceptives: Secondary | ICD-10-CM

## 2012-02-29 DIAGNOSIS — N809 Endometriosis, unspecified: Secondary | ICD-10-CM

## 2012-02-29 MED ORDER — MEDROXYPROGESTERONE ACETATE 150 MG/ML IM SUSP: 150.0000 mg | INTRAMUSCULAR | Status: DC

## 2012-03-17 ENCOUNTER — Telehealth: Payer: Self-pay | Admitting: *Deleted

## 2012-03-17 NOTE — Telephone Encounter (Signed)
Patient is requesting refill of her medications.  Patient is requesting refill of her Norco 10/325 and Ultram to be able to take during the day, she uses the Norco at night when needed but can not take and work.  She takes these as needed for endometriosis pain.

## 2012-03-17 NOTE — Telephone Encounter (Signed)
She hasn't asked before, I am ok with it, but not there to write it.

## 2012-03-19 ENCOUNTER — Other Ambulatory Visit: Payer: Self-pay | Admitting: Family Medicine

## 2012-03-19 DIAGNOSIS — N809 Endometriosis, unspecified: Secondary | ICD-10-CM

## 2012-03-19 MED ORDER — HYDROCODONE-ACETAMINOPHEN 10-325 MG PO TABS: 1.0000 | ORAL_TABLET | Freq: Four times a day (QID) | ORAL | Status: DC | PRN

## 2012-03-19 MED ORDER — TRAMADOL HCL 50 MG PO TABS: 50.0000 mg | ORAL_TABLET | Freq: Four times a day (QID) | ORAL | Status: DC | PRN

## 2012-03-19 NOTE — Progress Notes (Signed)
Pt. With extensive endometriosis history--needs pain meds-no requests here prior.

## 2012-03-21 ENCOUNTER — Ambulatory Visit (INDEPENDENT_AMBULATORY_CARE_PROVIDER_SITE_OTHER): Payer: PRIVATE HEALTH INSURANCE | Admitting: Family Medicine

## 2012-03-21 ENCOUNTER — Encounter: Payer: Self-pay | Admitting: Family Medicine

## 2012-03-21 VITALS — BP 104/70 | HR 72 | Wt 114.0 lb

## 2012-03-21 DIAGNOSIS — Z23 Encounter for immunization: Secondary | ICD-10-CM

## 2012-03-21 DIAGNOSIS — Z566 Other physical and mental strain related to work: Secondary | ICD-10-CM

## 2012-03-21 DIAGNOSIS — Z569 Unspecified problems related to employment: Secondary | ICD-10-CM

## 2012-03-21 DIAGNOSIS — N3010 Interstitial cystitis (chronic) without hematuria: Secondary | ICD-10-CM

## 2012-03-21 MED ORDER — SERTRALINE HCL 50 MG PO TABS: 50.0000 mg | ORAL_TABLET | Freq: Every day | ORAL | Status: DC

## 2012-03-21 NOTE — Progress Notes (Signed)
  Subjective:    Patient ID: Sharon Bowman, female    DOB: Oct 16, 1975, 36 y.o.   MRN: [id number]  HPI She is here for consultation. She is under a lot of stress from work related issues dealing with her new boss. She also has financial concerns in that she is not working full-time and she owns a home. She's concerned over being able to pay the mortgage payments starting in February when the Federal guarantees will no longer be an effect. She also has underlying interstitial cystitis and states that stress tends to make her IC symptoms much worse.   Review of Systems     Objective:   Physical Exam Alert and in no distress otherwise not examined       Assessment & Plan:   1. IC (interstitial cystitis)    2. Stress at work  sertraline (ZOLOFT) 50 MG tablet  I discussed stress at work with her and gave her some pointers on better interaction with her boss. Strongly encouraged her to ask questions and stick with the facts with in dealing with him to try and help diffuse the situation. Discussed possible referral to counseling however at this time she cannot afford it. Recheck here in one month. Flu shot given.risks and benefits were discussed.

## 2012-03-27 ENCOUNTER — Telehealth: Payer: Self-pay | Admitting: Family Medicine

## 2012-03-27 NOTE — Telephone Encounter (Signed)
ADVISED PT OF DR KNAPP'S INSTRUCTIONS. WROTE NOTE AS APPROVED BY DR Lynelle Doctor

## 2012-03-27 NOTE — Telephone Encounter (Signed)
We cannot write note about today.  We can only document that she was recently seen and being treated for anxiety.  If she truly is having anxiety attacks causing her to miss work, she may benefit from short-term xanax while waiting for the zoloft prescribed last week by Dr. Susann Givens to be effective.  Cannot rx this over the phone (controlled substance, pt not well known to me), but can schedule office visit.  I don't know if Dr. Susann Givens would be willing to write this note, but I cannot.

## 2012-04-14 ENCOUNTER — Encounter: Payer: Self-pay | Admitting: Internal Medicine

## 2012-04-21 ENCOUNTER — Ambulatory Visit: Payer: PRIVATE HEALTH INSURANCE | Admitting: Family Medicine

## 2012-05-28 ENCOUNTER — Other Ambulatory Visit: Payer: PRIVATE HEALTH INSURANCE

## 2012-05-29 ENCOUNTER — Ambulatory Visit: Payer: PRIVATE HEALTH INSURANCE

## 2012-06-04 ENCOUNTER — Ambulatory Visit (INDEPENDENT_AMBULATORY_CARE_PROVIDER_SITE_OTHER): Payer: PRIVATE HEALTH INSURANCE | Admitting: *Deleted

## 2012-06-04 DIAGNOSIS — Z3049 Encounter for surveillance of other contraceptives: Secondary | ICD-10-CM

## 2012-06-04 DIAGNOSIS — Z3042 Encounter for surveillance of injectable contraceptive: Secondary | ICD-10-CM

## 2012-06-04 MED ORDER — MEDROXYPROGESTERONE ACETATE 150 MG/ML IM SUSP: 150.0000 mg | INTRAMUSCULAR | Status: DC

## 2012-06-04 MED ORDER — MEDROXYPROGESTERONE ACETATE 150 MG/ML IM SUSP
150.0000 mg | Freq: Once | INTRAMUSCULAR | Status: AC
  Administered 2012-06-04: 150 mg via INTRAMUSCULAR

## 2012-07-02 HISTORY — PX: OTHER SURGICAL HISTORY: SHX169

## 2012-07-08 ENCOUNTER — Ambulatory Visit (INDEPENDENT_AMBULATORY_CARE_PROVIDER_SITE_OTHER): Payer: BC Managed Care – PPO | Admitting: Medical

## 2012-07-08 ENCOUNTER — Encounter: Payer: Self-pay | Admitting: Medical

## 2012-07-08 VITALS — BP 100/78 | HR 62 | Temp 98.2°F | Resp 16 | Wt 115.0 lb

## 2012-07-08 DIAGNOSIS — F430 Acute stress reaction: Secondary | ICD-10-CM

## 2012-07-08 MED ORDER — SERTRALINE HCL 100 MG PO TABS: 100.0000 mg | ORAL_TABLET | Freq: Every day | ORAL | Status: DC

## 2012-07-08 NOTE — Progress Notes (Signed)
Subjective: Here for recheck on Zoloft.  Saw Dr. Susann Givens September for stress related issues.  Was started on Zoloft to help with mood and help dealing with the stressors.  Feels like the medication needs to be increased.  She notes that her main stressor are financial.  Can't seem to get full time hours.  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  This could potentially turn in to full time.  Her main bill is mortgage.  Otherwise no other major stressors.  No relationship problems.  Single, lives alone, no children.  No other c/o.  Not in counseling.  Has been on Paxil 3- 4 years ago for depression.  She does exercise.  She is a smoker.    Past Medical History  Diagnosis Date  . Endometriosis   . PCOS (polycystic ovarian syndrome)   . Interstitial cystitis   . GAD (generalized anxiety disorder)   . Chronic sinusitis    ROS as in subjective  Objective Gen: wd, wn, nad Psych: pleasant, answers questions appropriately, good eye contact   Assessment: Encounter Diagnosis  Name Primary?  . Acute stress reaction Yes   Plan: Increased zoloft to 100mg  daily.  Discussed her concerns, counseled on budgeting, job performance strategies, trying to focus on getting more hours, full time, and living within means.  If no major improvement on increased dose, consider other medication and financial counseling.  The solution seems to be getting full time job and stability in her job situation.

## 2012-08-04 ENCOUNTER — Ambulatory Visit (INDEPENDENT_AMBULATORY_CARE_PROVIDER_SITE_OTHER): Payer: BC Managed Care – PPO | Admitting: Medical

## 2012-08-04 ENCOUNTER — Encounter: Payer: Self-pay | Admitting: Medical

## 2012-08-04 VITALS — BP 100/70 | HR 72 | Temp 97.6°F | Resp 16 | Wt 116.0 lb

## 2012-08-04 DIAGNOSIS — M79646 Pain in unspecified finger(s): Secondary | ICD-10-CM

## 2012-08-04 DIAGNOSIS — M79609 Pain in unspecified limb: Secondary | ICD-10-CM

## 2012-08-04 DIAGNOSIS — M778 Other enthesopathies, not elsewhere classified: Secondary | ICD-10-CM

## 2012-08-04 DIAGNOSIS — M65839 Other synovitis and tenosynovitis, unspecified forearm: Secondary | ICD-10-CM

## 2012-08-04 NOTE — Patient Instructions (Signed)
Thumb tendonitis -   Use the thumb spica splint for immobilization daily x 1-2 weeks  Rest the thumb  Use combination of ice and heat for healing  Tylenol or Ultram for pain  recheck 2  Weeks.

## 2012-08-04 NOTE — Progress Notes (Signed)
Subjective: Here for right thumb pain.  She is left handed.  Sore to touch, been going on since end of November.  Some days ok, some days very painful.  She saw me 07/08/12 and mentioned this injury, but since this seemed to be possible a work injury, we advised her to go to her employer to file as work injury.  She has spoken to her boss, and was advised to come back here for eval as they didn't feel this was work related.    Started [employer] in November.  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   She was doing this numerous times daily in November and December.  This is when she started having thumb pain.   Been having ongoing thumb pain since, but at times worse, at times somewhat better, but always sore to touch.   Had an flare up this weekend.  She notes that few days ago she was pressing on her door handle to her home, had immediate pain, a pop sensation, and felt like the bone moved, thinks she dislocated her thumb.    Denies any other trauma or injury to the thumb.  She notes some swelling of the thumb.  No prior fracture of the thumb.    Past Medical History  Diagnosis Date  . Endometriosis   . PCOS (polycystic ovarian syndrome)   . Interstitial cystitis   . GAD (generalized anxiety disorder)   . Chronic sinusitis    ROS  Gen: no fever, no chill Neuro: no weakness, numbness, tingling Skin: no erythema, but seemed bruised over the weekend MSK: no other joint issues currently   Objective: Gen: wd, wn, nad Skin: no erythema or ecchymosis MSK: right thumb tender at MCP and DIP, distal phalanx unable to fully extender, pain with thumb flexion and extension throughout, no obvious swelling or joint laxity.  Mild tenderness along forearm, but no other deformity, no atrophy, rest of fingers normal ROM, nontender, unremarkle UE exam otherwise Neuro: normal strength, sensation  of fingers, hands bilat Pulses and cap refill of hands normal  Assessment: Encounter Diagnoses  Name Primary?  . Thumb tendonitis Yes  . Thumb pain    Plan: Go for xray.   Etiology appears to be tendonitis.  Advised thumb spica splint (script given), rest, use ice/heat combination as discussed, Tylenol or Ultram for pain.   Recheck 2 wk pending xray results.

## 2012-08-05 ENCOUNTER — Ambulatory Visit
Admission: RE | Admit: 2012-08-05 | Discharge: 2012-08-05 | Disposition: A | Discharge status: Home / Self Care | Payer: BC Managed Care – PPO | Source: Ambulatory Visit | Attending: Medical | Admitting: Medical

## 2012-08-05 DIAGNOSIS — M79646 Pain in unspecified finger(s): Secondary | ICD-10-CM

## 2012-08-19 ENCOUNTER — Ambulatory Visit (INDEPENDENT_AMBULATORY_CARE_PROVIDER_SITE_OTHER): Payer: BC Managed Care – PPO | Admitting: Medical

## 2012-08-19 ENCOUNTER — Encounter: Payer: Self-pay | Admitting: Medical

## 2012-08-19 VITALS — BP 100/80 | HR 68 | Temp 98.1°F | Resp 16 | Wt 116.0 lb

## 2012-08-19 DIAGNOSIS — M65849 Other synovitis and tenosynovitis, unspecified hand: Secondary | ICD-10-CM

## 2012-08-19 DIAGNOSIS — F411 Generalized anxiety disorder: Secondary | ICD-10-CM

## 2012-08-19 DIAGNOSIS — M778 Other enthesopathies, not elsewhere classified: Secondary | ICD-10-CM

## 2012-08-19 MED ORDER — PREDNISONE 20 MG PO TABS: ORAL_TABLET | ORAL | Status: DC

## 2012-08-19 MED ORDER — LORAZEPAM 0.5 MG PO TABS: 0.5000 mg | ORAL_TABLET | Freq: Two times a day (BID) | ORAL | Status: DC | PRN

## 2012-08-19 MED ORDER — HYDROCODONE-ACETAMINOPHEN 7.5-325 MG PO TABS: 1.0000 | ORAL_TABLET | Freq: Four times a day (QID) | ORAL | Status: DC | PRN

## 2012-08-19 NOTE — Patient Instructions (Signed)

## 2012-08-19 NOTE — Progress Notes (Signed)
Subjective: Here for right thumb pain recheck.  Since last visit she has been using ice, thumb spica splint, rest when possible, tylenol, but still having lots of pain, no real improvement.  She is left handed.  Sore to touch, been going on since end of November.  Some days ok, some days very painful.  Started [employer] in November.  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   She was doing this numerous times daily in November and December.  This is when she started having thumb pain.   She reports an episode few weeks ago where she was pressing on her door handle to her home, had immediate pain, a pop sensation, and felt like the bone moved, thinks she dislocated her thumb.    Denies any other trauma or injury to the thumb.  She notes some swelling of the thumb.  No prior fracture of the thumb.    Been having a lot of problems with anxiety and panic attacks.  This is mostly related to her financial situation, lack of full time employment and bills. At times has multiple panic episodes throughout the day.  Taking Zoloft 100mg , but still having problems dealing with anxiety.   Past Medical History  Diagnosis Date  . Endometriosis   . PCOS (polycystic ovarian syndrome)   . Interstitial cystitis   . GAD (generalized anxiety disorder)   . Chronic sinusitis    ROS  Gen: no fever, no chill Neuro: no weakness, numbness, tingling Skin: no erythema, but seemed bruised over the weekend MSK: no other joint issues currently   Objective: Gen: wd, wn, nad Skin: no erythema or ecchymosis MSK: right thumb tender at MCP and DIP, thumb with normal ROM but with pain, pain with thumb flexion and extension throughout, no obvious swelling or joint laxity.  Mild tenderness along forearm, but no other deformity, no atrophy, rest of fingers normal ROM, nontender, unremarkable UE exam  otherwise Neuro: normal strength, sensation of fingers, hands bilat Pulses and cap refill of hands normal  Assessment: Encounter Diagnoses  Name Primary?  . Thumb tendonitis Yes  . Anxiety state, unspecified    Plan: Thumb tendonitis - not much improved.  Discussed continuing thumb spica splint, alternating ice/heat therapy, rest, will begin short course of steroid dose pak, hydrocodone/acetaminophen prn worse pain, Tylenol OTC for mild pain.  Recommended referral to PT/OT vs ortho, but she declines for now.  Anxiety - discussed ways to deal with anxiety.  C/t Zoloft 100mg , short term prn use of Ativan, discussed dealing with panic attack.  Advised she seek financial counseling.  F/u 2 wk.

## 2012-09-03 ENCOUNTER — Ambulatory Visit (INDEPENDENT_AMBULATORY_CARE_PROVIDER_SITE_OTHER): Payer: BC Managed Care – PPO | Admitting: *Deleted

## 2012-09-03 DIAGNOSIS — Z3049 Encounter for surveillance of other contraceptives: Secondary | ICD-10-CM

## 2012-09-03 DIAGNOSIS — Z3042 Encounter for surveillance of injectable contraceptive: Secondary | ICD-10-CM

## 2012-09-03 MED ORDER — MEDROXYPROGESTERONE ACETATE 150 MG/ML IM SUSP
150.0000 mg | INTRAMUSCULAR | Status: DC
  Administered 2012-09-03: 150 mg via INTRAMUSCULAR

## 2012-09-03 NOTE — Progress Notes (Signed)
Patient is here today for her Depo Provera injection.  She is doing well. 

## 2012-09-23 ENCOUNTER — Ambulatory Visit (INDEPENDENT_AMBULATORY_CARE_PROVIDER_SITE_OTHER): Payer: BC Managed Care – PPO | Admitting: Medical

## 2012-09-23 ENCOUNTER — Encounter: Payer: Self-pay | Admitting: Medical

## 2012-09-23 VITALS — BP 100/70 | HR 100 | Temp 98.0°F | Resp 16 | Wt 122.0 lb

## 2012-09-23 DIAGNOSIS — Z2089 Contact with and (suspected) exposure to other communicable diseases: Secondary | ICD-10-CM

## 2012-09-23 DIAGNOSIS — N3010 Interstitial cystitis (chronic) without hematuria: Secondary | ICD-10-CM

## 2012-09-23 DIAGNOSIS — Z20818 Contact with and (suspected) exposure to other bacterial communicable diseases: Secondary | ICD-10-CM

## 2012-09-23 DIAGNOSIS — G4700 Insomnia, unspecified: Secondary | ICD-10-CM

## 2012-09-23 MED ORDER — AMITRIPTYLINE HCL 50 MG PO TABS: 50.0000 mg | ORAL_TABLET | Freq: Every day | ORAL | Status: DC

## 2012-09-23 MED ORDER — MUPIROCIN 2 % EX OINT: TOPICAL_OINTMENT | Freq: Three times a day (TID) | CUTANEOUS | Status: DC

## 2012-09-23 NOTE — Progress Notes (Signed)
Subjective: Here for concern for MRSA.  She notes that she has been over her friend's house recently, spent the night there.  The married couple both have MRSA.   He had recent ingrown hair in groin, infected, that was + MRSA.   The wife is pregnant, and on routine nasal swab, tested positive for MRSA.  She wants to be tested for MRSA.  Needs refill on Elavil, uses for sleep which works well. Been out of it 2 wk and can't sleep.   Sleep issues related to frequency urination due to interstitial cystitis.    Interstitial cystitis - sees nephrology in May, will have full panel of labs then.  Past Medical History  Diagnosis Date  . Endometriosis   . PCOS (polycystic ovarian syndrome)   . Interstitial cystitis   . GAD (generalized anxiety disorder)   . Chronic sinusitis    ROS as in subjective  Objective:   Gen: wd, wn, nad Skin: warm, dry, no lesions Heart: RRR, no murmurs, normal s1, s2 Lungs: clear  Assessment: Encounter Diagnoses  Name Primary?  Sharon Bowman MRSA exposure Yes  . Insomnia   . Interstitial cystitis     MRSA exposure - no obvious infection or skin lesions today.  Asymptomatic.  Due to MRSA exposure, begin Mupirocin ointment in the nose 3 times daily for a week.  Get Hibiclens body wash OTC, and do a body wash 2-3 times weekly for 2 weeks, clean surfaces in your home such as countertops, door knobs, etc.  Clean clothes on hot cycle,  Clean your body.   Otherwise you don't have any wounds/infections currently, no nothing else to do prophylactic this point.   F/u in 3-4 wk for MRSA nasal swab.  Insomnia - refilled elavil, does well on this.   Interstitial cystitis - f/u in May with nephrology.    Return soon for physical.

## 2012-09-23 NOTE — Patient Instructions (Addendum)
Due to MRSA exposure, begin Mupirocin ointment in the nose 3 times daily for a week.  Get Hibiclens body wash OTC, and do a body wash 2-3 times weekly for 2 weeks, clean surfaces in your home such as countertops, door knobs, etc.  Clean clothes on hot cycle,  Clean your body.   Otherwise you don't have any wounds/infections currently, no nothing else to do pprophylacticallyat this point.    MRSA Overview MRSA stands for methicillin-resistant Staphylococcus aureus. It is a type of bacteria that is resistant to some common antibiotics. It can cause infections in the skin and many other places in the body. Staphylococcus aureus, often called "staph," is a bacteria that normally lives on the skin or in the nose. Staph on the surface of the skin or in the nose does not cause problems. However, if the staph enters the body through a cut, wound, or break in the skin, an infection can happen. Up until recently, infections with the MRSA type of staph mainly occurred in hospitals and other healthcare settings. There are now increasing problems with MRSA infections in the community as well. Infections with MRSA may be very serious or even life-threatening. Most MRSA infections are acquired in one of two ways:  Healthcare-associated MRSA (HA-MRSA)  This can be acquired by people in any healthcare setting. MRSA can be a big problem for hospitalized people, people in nursing homes, people in rehabilitation facilities, people with weakened immune systems, dialysis patients, and those who have had surgery.  Community-associated MRSA (CA-MRSA)  Community spread of MRSA is becoming more common. It is known to spread in crowded settings, in jails and prisons, and in situations where there is close skin-to-skin contact, such as during sporting events or in locker rooms. MRSA can be spread through shared items, such as children's toys, razors, towels, or sports equipment. CAUSES  All staph, including MRSA, are normally  harmless unless they enter the body through a scratch, cut, or wound, such as with surgery. All staph, including MRSA, can be spread from person-to-person by touching contaminated objects or through direct contact. SPECIAL GROUPS MRSA can present problems for special groups of people. Some of these groups include:  Breastfeeding women.  The most common problem is MRSA infection of the breast (mastitis). There is evidence that MRSA can be passed to an infant from infected breast milk. Your caregiver may recommend that you stop breastfeeding until the mastitis is under control.  If you are breastfeeding and have a MRSA infection in a place other than the breast, you may usually continue breastfeeding while under treatment. If taking antibiotics, ask your caregiver if it is safe to continue breastfeeding while taking your prescribed medicines.  Neonates (babies from birth to 50 month old) and infants (babies from 6 month to 68 year old).  There is evidence that MRSA can be passed to a newborn at birth if the mother has MRSA on the skin, in or around the birth canal, or an infection in the uterus, cervix, or vagina. MRSA infection can have the same appearance as a normal newborn or infant rash or several other skin infections. This can make it hard to diagnose MRSA.  Immune compromised people.  If you have an immune system problem, you may have a higher chance of developing a MRSA infection.  People after any type of surgery.  Staph in general, including MRSA, is the most common cause of infections occurring at the site of recent surgery.  People on long-term steroid  medicines.  These kinds of medicines can lower your resistance to infection. This can increase your chance of getting MRSA.  People who have had frequent hospitalizations, live in nursing homes or other residential care facilities, have venous or urinary catheters, or have taken multiple courses of antibiotic therapy for any  reason. DIAGNOSIS  Diagnosis of MRSA is done by cultures of fluid samples that may come from:  Swabs taken from cuts or wounds in infected areas.  Nasal swabs.  Saliva or deep cough specimens from the lungs (sputum).  Urine.  Blood. Many people are "colonized" with MRSA but have no signs of infection. This means that people carry the MRSA germ on their skin or in their nose and may never develop MRSA infection.  TREATMENT  Treatment varies and is based on how serious, how deep, or how extensive the infection is. For example:  Some skin infections, such as a small boil or abscess, may be treated by draining yellowish-white fluid (pus) from the site of the infection.  Deeper or more widespread soft tissue infections are usually treated with surgery to drain pus and with antibiotic medicine given by vein or by mouth. This may be recommended even if you are pregnant.  Serious infections may require a hospital stay. If antibiotics are given, they may be needed for several weeks. PREVENTION  Because many people are colonized with staph, including MRSA, preventing the spread of the bacteria from person-to-person is most important. The best way to prevent the spread of bacteria and other germs is through proper hand washing or by using alcohol-based hand disinfectants. The following are other ways to help prevent MRSA infection within the hospital and community settings.   Healthcare settings:  Strict hand washing or hand disinfection procedures need to be followed before and after touching every patient.  Patients infected with MRSA are placed in isolation to prevent the spread of the bacteria.  Healthcare workers need to wear disposable gowns and gloves when touching or caring for patients infected with MRSA. Visitors may also be asked to wear a gown and gloves.  Hospital surfaces need to be disinfected frequently.  Community settings:  NIKE frequently with soap and water  for at least 15 seconds. Otherwise, use alcohol-based hand disinfectants when soap and water is not available.  Make sure people who live with you wash their hands often, too.  Do not share personal items. For example, avoid sharing razors and other personal hygiene items, towels, clothing, and athletic equipment.  Wash and dry your clothes and bedding at the warmest temperatures recommended on the labels.  Keep wounds covered. Pus from infected sores may contain MRSA and other bacteria. Keep cuts and abrasions clean and covered with germ-free (sterile), dry bandages until they are healed.  If you have a wound that appears infected, ask your caregiver if a culture for MRSA and other bacteria should be done.  If you are breastfeeding, talk to your caregiver about MRSA. You may be asked to temporarily stop breastfeeding. HOME CARE INSTRUCTIONS   Take your antibiotics as directed. Finish them even if you start to feel better.  Avoid close contact with those around you as much as possible. Do not use towels, razors, toothbrushes, bedding, or other items that will be used by others.  To fight the infection, follow your caregiver's instructions for wound care. Wash your hands before and after changing your bandages.  If you have an intravascular device, such as a catheter, make sure you  know how to care for it.  Be sure to tell any healthcare providers that you have MRSA so they are aware of your infection. SEEK IMMEDIATE MEDICAL CARE IF:   The infection appears to be getting worse. Signs include:  Increased warmth, redness, or tenderness around the wound site.  A red line that extends from the infection site.  A dark color in the area around the infection.  Wound drainage that is tan, yellow, or green.  A bad smell coming from the wound.  You feel sick to your stomach (nauseous) and throw up (vomit) or cannot keep medicine down.  You have a fever.  Your baby is older than 3  months with a rectal temperature of 102 F (38.9 C) or higher.  Your baby is 42 months old or younger with a rectal temperature of 100.4 F (38 C) or higher.  You have difficulty breathing. MAKE SURE YOU:   Understand these instructions.  Will watch your condition.  Will get help right away if you are not doing well or get worse. Document Released: 06/18/2005 Document Revised: 09/10/2011 Document Reviewed: 09/20/2010 [facility] Patient Information 2013 [place], [place].

## 2012-10-13 ENCOUNTER — Other Ambulatory Visit: Payer: Self-pay | Admitting: Medical

## 2012-10-13 MED ORDER — SERTRALINE HCL 100 MG PO TABS: 100.0000 mg | ORAL_TABLET | Freq: Every day | ORAL | Status: DC

## 2012-10-13 NOTE — Telephone Encounter (Signed)
Is this okay?

## 2012-11-26 ENCOUNTER — Ambulatory Visit (INDEPENDENT_AMBULATORY_CARE_PROVIDER_SITE_OTHER): Payer: BC Managed Care – PPO | Admitting: Medical

## 2012-11-26 ENCOUNTER — Encounter: Payer: Self-pay | Admitting: Medical

## 2012-11-26 VITALS — BP 110/78 | HR 87 | Temp 97.7°F | Resp 16 | Wt 120.0 lb

## 2012-11-26 DIAGNOSIS — R200 Anesthesia of skin: Secondary | ICD-10-CM

## 2012-11-26 DIAGNOSIS — R209 Unspecified disturbances of skin sensation: Secondary | ICD-10-CM

## 2012-11-26 DIAGNOSIS — M65849 Other synovitis and tenosynovitis, unspecified hand: Secondary | ICD-10-CM

## 2012-11-26 DIAGNOSIS — M778 Other enthesopathies, not elsewhere classified: Secondary | ICD-10-CM

## 2012-11-26 NOTE — Progress Notes (Signed)
Subjective: Here for right and left hand c/o.  She is left handed.  I saw her in 08/2012 for right thumb and forearm pain suggestive of deQuervain tendonitis and thumb tendonitis.   Overall she has seen improvement with thumb spica splint and subsequent reinforced wrist splint on the right.   However, during the last month or 2, she has managed to "dislocate" her thumb twice while using the splint.   She is not 100% better and wants other suggestions to help improve right hand pain.   In addition, she has started getting numbness in her left hand and fingers, all except small finger.  This is worse when she awakes and sometimes lasts mid morning.   She denies ongoing daytime or night time numbness, only first thing in the morning.  She does sleep on her side, she tries to avoid sleeping on the arm.   She denies injury or trauma.   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  No other aggravating or relieving factors.    ROS as in subjective  Past Medical History  Diagnosis Date  . Endometriosis   . PCOS (polycystic ovarian syndrome)   . Interstitial cystitis   . GAD (generalized anxiety disorder)   . Chronic sinusitis   . Polycystic kidney disease     Objective: Gen: wd, wn, nad Skin: unremarkalbe of arms/hands MSK: mild tenderness right lateral wrist, +finkelstein test, tender base of thumb, otherwise nontender bilat hands, fingers, arms, normal ROM, no obvious deformity of UE throughout Neuro: normal wrist, fingers and arm strength, DTRs and sensation, -phalens, but she gets a jolt of pain with tinels bilat Pulses and cap refill normal of bilat UE  Assessment: Encounter Diagnoses  Name Primary?  . Thumb tendonitis Yes  . Tendonitis of wrist, right   . Numbness of hand    Plan: At this point only about 80% improved of right forearm pain and thumb tendonitis.    We have avoided NSAIDs given hx/o PCKD, she has had some improvement  with reinforced wrist splint and thumb spica splint.   Referral to ortho.  Numbness of hand - no obvious carpal or ulnar tunnel syndrome.  Seems more related to sleep position and pressure on median and/or ulnar nerves during sleep.  Advised she try and use pillows and positioning to reduce pressure on arm/hand.  She will discuss this with ortho as well, but in meantime can use night time reinforced wrist splint on the left too.

## 2012-11-27 ENCOUNTER — Other Ambulatory Visit: Payer: Self-pay | Admitting: Nephrology

## 2012-11-27 DIAGNOSIS — Q613 Polycystic kidney, unspecified: Secondary | ICD-10-CM

## 2012-11-28 ENCOUNTER — Telehealth: Payer: Self-pay | Admitting: Family Medicine

## 2012-11-28 NOTE — Telephone Encounter (Signed)
Message copied by Janeice Robinson on Fri Nov 28, 2012  8:55 AM ------      Message from: Aleen Campi, DAVID S      Created: Wed Nov 26, 2012  7:44 PM       Refer to [facility] for right thumb tendonitis x 2+ mo and left hand numbness ------

## 2012-11-28 NOTE — Telephone Encounter (Signed)
PATIENT IS AWARE OF HER APPOINTMENT AT [facility]. ON December 01, 2012 @ 230 PM WITH DR. THOMPSON. CLS [phone]

## 2012-12-03 ENCOUNTER — Ambulatory Visit: Payer: BC Managed Care – PPO

## 2012-12-05 ENCOUNTER — Ambulatory Visit
Admission: RE | Admit: 2012-12-05 | Discharge: 2012-12-05 | Disposition: A | Discharge status: Home / Self Care | Payer: BC Managed Care – PPO | Source: Ambulatory Visit | Attending: Nephrology | Admitting: Nephrology

## 2012-12-05 DIAGNOSIS — Q613 Polycystic kidney, unspecified: Secondary | ICD-10-CM

## 2013-01-25 ENCOUNTER — Other Ambulatory Visit: Payer: Self-pay | Admitting: Medical

## 2013-01-26 NOTE — Telephone Encounter (Signed)
Is this okay?

## 2013-03-04 ENCOUNTER — Ambulatory Visit (INDEPENDENT_AMBULATORY_CARE_PROVIDER_SITE_OTHER): Payer: BC Managed Care – PPO | Admitting: Medical

## 2013-03-04 ENCOUNTER — Encounter: Payer: Self-pay | Admitting: Medical

## 2013-03-04 VITALS — BP 90/60 | HR 94 | Temp 97.6°F | Resp 16 | Wt 119.0 lb

## 2013-03-04 DIAGNOSIS — R509 Fever, unspecified: Secondary | ICD-10-CM

## 2013-03-04 DIAGNOSIS — K921 Melena: Secondary | ICD-10-CM

## 2013-03-04 DIAGNOSIS — R6883 Chills (without fever): Secondary | ICD-10-CM

## 2013-03-04 DIAGNOSIS — R195 Other fecal abnormalities: Secondary | ICD-10-CM

## 2013-03-04 LAB — CBC WITH DIFFERENTIAL/PLATELET
Basophils Relative: 0 % (ref 0–1)
Eosinophils Absolute: 0.2 10*3/uL (ref 0.0–0.7)
Eosinophils Relative: 2 % (ref 0–5)
HCT: 40.4 % (ref 36.0–46.0)
Hemoglobin: 14.2 g/dL (ref 12.0–15.0)
MCH: 32.1 pg (ref 26.0–34.0)
MCHC: 35.1 g/dL (ref 30.0–36.0)
MCV: 91.2 fL (ref 78.0–100.0)
Monocytes Absolute: 0.4 10*3/uL (ref 0.1–1.0)
Monocytes Relative: 4 % (ref 3–12)
Neutro Abs: 6 10*3/uL (ref 1.7–7.7)

## 2013-03-04 LAB — POCT URINALYSIS DIPSTICK
Blood, UA: NEGATIVE
Glucose, UA: NEGATIVE
Ketones, UA: NEGATIVE
Spec Grav, UA: <=1.005
Urobilinogen, UA: NEGATIVE

## 2013-03-04 LAB — BASIC METABOLIC PANEL
BUN: 12 mg/dL (ref 6–23)
Creat: 0.68 mg/dL (ref 0.50–1.10)
Glucose, Bld: 94 mg/dL (ref 70–99)
Potassium: 4.2 mEq/L (ref 3.5–5.3)

## 2013-03-04 NOTE — Progress Notes (Signed)
Subjective: Here for fever and chills.  Monday 2 days ago started feeling bad, got hot flushed feeling.  Been alternating between chills and flushed feeling and light headed. Felt like she was going to pass out at work Monday.  Supervisor said she looked clammy and pale.  Since Monday she has had intermittent fever 100-101.  Had 3 black stools on Monday.  If moving a lot gets a headache, otherwise no symptoms.  No sick contacts.  She does have hx/o anemia, PCKD, PCOS, and hx/o UTI.  Review of Systems ENT: -runny nose, -ear pain, -sore throat, +mild frontal sinus pressure, +headache Cardiology:  -chest pain, -palpitations, -edema Respiratory: -cough, -shortness of breath, -wheezing Gastroenterology: -abdominal pain, +mild nausea, -vomiting, -diarrhea, -constipation  Hematology: -bleeding or bruising problems Musculoskeletal: +arthralgias, +myalgias, -joint swelling, -back pain Ophthalmology: -vision changes Urology: -dysuria, -difficulty urinating, -hematuria, -urinary frequency, -urgency Neurology: -headache, -weakness, -tingling, -numbness  Past Medical History  Diagnosis Date  . Endometriosis   . PCOS (polycystic ovarian syndrome)   . Interstitial cystitis   . GAD (generalized anxiety disorder)   . Chronic sinusitis   . Polycystic kidney disease    Objective: Filed Vitals:   03/04/13 1153  BP: 90/60  Pulse: 94  Temp:   Resp:     General appearance: alert, no distress, WD/WN Skin: some pallor noted of conjunctiva and palms, although overall she appears tanned in general HEENT: normocephalic, sclerae anicteric, TMs pearly, nares patent, no discharge or erythema, pharynx normal Oral cavity: MMM, no lesions Neck: supple, no lymphadenopathy, no thyromegaly, no masses Heart: RRR, normal S1, S2, no murmurs Lungs: CTA bilaterally, no wheezes, rhonchi, or rales Abdomen: +bs, soft, non tender, non distended, no masses, no hepatomegaly, no splenomegaly Pulses: 2+ symmetric, upper and  lower extremities, normal cap refill DRE: anus normal tone, occult negative stool, exam chaperoned by nurse   Assessment: Encounter Diagnoses  Name Primary?  . Fever, unspecified Yes  . Black stool   . Chills     Plan: I suspect a viral syndrome, however given 1 day or reportedly black stools and her hx/o anemia and PCKD, will check baseline labs.  Orthostatics somewhat abnormal, which she hasn't been hydrating well the last few days.  Follow-up pending labs.

## 2013-03-04 NOTE — Progress Notes (Signed)
Lmom to cb. CLS 

## 2013-03-26 ENCOUNTER — Other Ambulatory Visit (INDEPENDENT_AMBULATORY_CARE_PROVIDER_SITE_OTHER): Payer: BC Managed Care – PPO

## 2013-03-26 DIAGNOSIS — Z23 Encounter for immunization: Secondary | ICD-10-CM

## 2013-05-21 ENCOUNTER — Other Ambulatory Visit: Payer: Self-pay | Admitting: Medical

## 2013-06-27 ENCOUNTER — Other Ambulatory Visit: Payer: Self-pay | Admitting: Medical

## 2013-07-03 ENCOUNTER — Encounter: Payer: Self-pay | Admitting: Medical

## 2013-07-03 ENCOUNTER — Ambulatory Visit (INDEPENDENT_AMBULATORY_CARE_PROVIDER_SITE_OTHER): Payer: BC Managed Care – PPO | Admitting: Medical

## 2013-07-03 VITALS — BP 100/70 | HR 72 | Temp 97.9°F | Resp 16 | Wt 125.0 lb

## 2013-07-03 DIAGNOSIS — H6692 Otitis media, unspecified, left ear: Secondary | ICD-10-CM

## 2013-07-03 DIAGNOSIS — J029 Acute pharyngitis, unspecified: Secondary | ICD-10-CM

## 2013-07-03 DIAGNOSIS — H6690 Otitis media, unspecified, unspecified ear: Secondary | ICD-10-CM

## 2013-07-03 DIAGNOSIS — R059 Cough, unspecified: Secondary | ICD-10-CM

## 2013-07-03 DIAGNOSIS — R52 Pain, unspecified: Secondary | ICD-10-CM

## 2013-07-03 DIAGNOSIS — R05 Cough: Secondary | ICD-10-CM

## 2013-07-03 LAB — POC INFLUENZA A&B (BINAX/QUICKVUE)
INFLUENZA A, POC: NEGATIVE
INFLUENZA B, POC: NEGATIVE

## 2013-07-03 LAB — POCT RAPID STREP A (OFFICE): Rapid Strep A Screen: NEGATIVE

## 2013-07-03 MED ORDER — AMOXICILLIN 875 MG PO TABS: 875.0000 mg | ORAL_TABLET | Freq: Two times a day (BID) | ORAL | Status: DC

## 2013-07-03 NOTE — Progress Notes (Signed)
Subjective:  Sharon Bowman is a 38 y.o. female who presents for illness.  She reports 2 day history of sore throat, cough, nasal drainage, runny nose, a little short of breath, some aches, ear pain.  Denies nausea, vomiting, diarrhea, teeth pain, wheezing. She does have sick contacts, quite a few at work. Using nothing currently for symptoms other than Neti pot and hydration.  No other aggravating or relieving factors.  No other c/o.  The following portions of the patient's history were reviewed and updated as appropriate: allergies, current medications, past medical history, past social history and problem list.  ROS as in subjective   Past Medical History  Diagnosis Date  . Endometriosis   . PCOS (polycystic ovarian syndrome)   . Interstitial cystitis   . GAD (generalized anxiety disorder)   . Chronic sinusitis   . Polycystic kidney disease      Objective: BP 100/70  Pulse 72  Temp(Src) 97.9 F (36.6 C)  Resp 16  Wt 125 lb (56.7 kg)  SpO2 98%  General: Ill-appearing, well-developed, well-nourished Skin: warm, dry HEENT: Nose inflamed and congested, clear conjunctiva, left TM with erythema and retraction, right tm flat, no sinus tenderness, pharynx with erythema, no exudates Neck: Supple, nontender, shotty cervical adenopathy Heart: Regular rate and rhythm, normal S1, S2, no murmurs Lungs: Clear to auscultation bilaterally, no wheezes, rales, rhonchi    Assessment and Plan: Encounter Diagnoses  Name Primary?  . Otitis media, left Yes  . Sore throat   . Cough   . Body aches    Although the flu and strep tests were negative, I will put her on amoxicillin for ear infection, but also suggests that she still may have a flu like illness given her exam findings and symptoms.  Advised supportive care including rest, hydration, OTC Tylenol for fever, aches, and malaise.  Discussed period of contagion, self quarantine at home away from others to avoid spread of disease,  discussed means of transmission, and possible complications including pneumonia.  If worse or not improving within the next 4-5 days, then call or return.  Gave note for work.

## 2013-07-06 NOTE — Addendum Note (Signed)
Addended by: Armanda Magic on: 07/06/2013 04:45 PM   Modules accepted: Orders

## 2013-07-13 ENCOUNTER — Other Ambulatory Visit: Payer: Self-pay | Admitting: Medical

## 2013-07-13 ENCOUNTER — Telehealth: Payer: Self-pay | Admitting: Family Medicine

## 2013-07-13 MED ORDER — AMOXICILLIN-POT CLAVULANATE 875-125 MG PO TABS: 1.0000 | ORAL_TABLET | Freq: Two times a day (BID) | ORAL | Status: DC

## 2013-07-13 NOTE — Telephone Encounter (Signed)
Different round of abx sent.

## 2013-07-13 NOTE — Telephone Encounter (Signed)
Pt called and states she is 50% better, but finished with antibiotic.  She needs either a 2nd round or a different antibiotic.  She still has a cough and has green/yellow mucous, still in head and nose.   [facility] [address]  Pt phone [phone].

## 2013-11-06 ENCOUNTER — Other Ambulatory Visit: Payer: Self-pay | Admitting: Nephrology

## 2013-11-06 DIAGNOSIS — R109 Unspecified abdominal pain: Secondary | ICD-10-CM

## 2013-11-10 ENCOUNTER — Ambulatory Visit
Admission: RE | Admit: 2013-11-10 | Discharge: 2013-11-10 | Disposition: A | Discharge status: Home / Self Care | Payer: BC Managed Care – PPO | Source: Ambulatory Visit | Attending: Nephrology | Admitting: Nephrology

## 2013-11-10 DIAGNOSIS — R109 Unspecified abdominal pain: Secondary | ICD-10-CM

## 2014-04-01 DIAGNOSIS — I82409 Acute embolism and thrombosis of unspecified deep veins of unspecified lower extremity: Secondary | ICD-10-CM

## 2014-04-01 HISTORY — PX: LEG SURGERY: SHX1003

## 2014-04-01 HISTORY — DX: Acute embolism and thrombosis of unspecified deep veins of unspecified lower extremity: I82.409

## 2014-04-12 ENCOUNTER — Other Ambulatory Visit: Payer: Self-pay | Admitting: Obstetrics

## 2014-04-16 ENCOUNTER — Other Ambulatory Visit (HOSPITAL_COMMUNITY): Payer: Self-pay | Admitting: Family Medicine

## 2014-04-16 ENCOUNTER — Encounter (HOSPITAL_COMMUNITY): Payer: Self-pay | Admitting: Emergency Medicine

## 2014-04-16 ENCOUNTER — Telehealth: Payer: Self-pay | Admitting: Internal Medicine

## 2014-04-16 ENCOUNTER — Ambulatory Visit (HOSPITAL_COMMUNITY)
Admission: RE | Admit: 2014-04-16 | Discharge: 2014-04-16 | Disposition: A | Discharge status: Home / Self Care | Payer: BC Managed Care – PPO | Source: Ambulatory Visit | Attending: Family Medicine | Admitting: Family Medicine

## 2014-04-16 ENCOUNTER — Emergency Department (HOSPITAL_COMMUNITY)
Admission: EM | Admit: 2014-04-16 | Discharge: 2014-04-16 | Disposition: A | Discharge status: Home / Self Care | Payer: BC Managed Care – PPO | Attending: Emergency Medicine | Admitting: Emergency Medicine

## 2014-04-16 DIAGNOSIS — Z720 Tobacco use: Secondary | ICD-10-CM | POA: Insufficient documentation

## 2014-04-16 DIAGNOSIS — Z8709 Personal history of other diseases of the respiratory system: Secondary | ICD-10-CM | POA: Insufficient documentation

## 2014-04-16 DIAGNOSIS — Z8639 Personal history of other endocrine, nutritional and metabolic disease: Secondary | ICD-10-CM | POA: Insufficient documentation

## 2014-04-16 DIAGNOSIS — Z87448 Personal history of other diseases of urinary system: Secondary | ICD-10-CM | POA: Diagnosis not present

## 2014-04-16 DIAGNOSIS — Z91040 Latex allergy status: Secondary | ICD-10-CM | POA: Insufficient documentation

## 2014-04-16 DIAGNOSIS — Z79899 Other long term (current) drug therapy: Secondary | ICD-10-CM | POA: Insufficient documentation

## 2014-04-16 DIAGNOSIS — M7989 Other specified soft tissue disorders: Secondary | ICD-10-CM | POA: Diagnosis present

## 2014-04-16 DIAGNOSIS — I824Z1 Acute embolism and thrombosis of unspecified deep veins of right distal lower extremity: Secondary | ICD-10-CM | POA: Insufficient documentation

## 2014-04-16 DIAGNOSIS — F17200 Nicotine dependence, unspecified, uncomplicated: Secondary | ICD-10-CM

## 2014-04-16 DIAGNOSIS — Z8742 Personal history of other diseases of the female genital tract: Secondary | ICD-10-CM | POA: Diagnosis not present

## 2014-04-16 DIAGNOSIS — I82401 Acute embolism and thrombosis of unspecified deep veins of right lower extremity: Secondary | ICD-10-CM

## 2014-04-16 DIAGNOSIS — F411 Generalized anxiety disorder: Secondary | ICD-10-CM | POA: Insufficient documentation

## 2014-04-16 LAB — CBC WITH DIFFERENTIAL/PLATELET
Basophils Absolute: 0 K/uL (ref 0.0–0.1)
Basophils Relative: 0 % (ref 0–1)
Eosinophils Absolute: 0.2 K/uL (ref 0.0–0.7)
Eosinophils Relative: 2 % (ref 0–5)
HCT: 34.7 % — ABNORMAL LOW (ref 36.0–46.0)
Hemoglobin: 11.7 g/dL — ABNORMAL LOW (ref 12.0–15.0)
Lymphocytes Relative: 23 % (ref 12–46)
Lymphs Abs: 3 K/uL (ref 0.7–4.0)
MCH: 31 pg (ref 26.0–34.0)
MCHC: 33.7 g/dL (ref 30.0–36.0)
MCV: 91.8 fL (ref 78.0–100.0)
Monocytes Absolute: 0.8 K/uL (ref 0.1–1.0)
Monocytes Relative: 6 % (ref 3–12)
Neutro Abs: 9 K/uL — ABNORMAL HIGH (ref 1.7–7.7)
Neutrophils Relative %: 69 % (ref 43–77)
Platelets: 357 K/uL (ref 150–400)
RBC: 3.78 MIL/uL — ABNORMAL LOW (ref 3.87–5.11)
RDW: 12.5 % (ref 11.5–15.5)
WBC: 13.1 K/uL — ABNORMAL HIGH (ref 4.0–10.5)

## 2014-04-16 LAB — BASIC METABOLIC PANEL
Anion gap: 14 (ref 5–15)
BUN: 8 mg/dL (ref 6–23)
CHLORIDE: 100 meq/L (ref 96–112)
CO2: 25 mEq/L (ref 19–32)
Calcium: 9.4 mg/dL (ref 8.4–10.5)
Creatinine, Ser: 0.6 mg/dL (ref 0.50–1.10)
GFR calc non Af Amer: >90 mL/min (ref >90)
GLUCOSE: 91 mg/dL (ref 70–99)
POTASSIUM: 3.6 meq/L — AB (ref 3.7–5.3)
Sodium: 139 mEq/L (ref 137–147)

## 2014-04-16 LAB — PROTIME-INR
INR: 1.01 (ref 0.00–1.49)
Prothrombin Time: 13.4 seconds (ref 11.6–15.2)

## 2014-04-16 LAB — APTT: APTT: 33 s (ref 24–37)

## 2014-04-16 MED ORDER — XARELTO VTE STARTER PACK 15 & 20 MG PO TBPK: 15.0000 mg | ORAL_TABLET | ORAL | Status: DC

## 2014-04-16 MED ORDER — RIVAROXABAN 15 MG PO TABS
15.0000 mg | ORAL_TABLET | Freq: Once | ORAL | Status: AC
  Administered 2014-04-16: 15 mg via ORAL
  Filled 2014-04-16: qty 1

## 2014-04-16 MED ORDER — ONDANSETRON HCL 4 MG/2ML IJ SOLN
4.0000 mg | Freq: Once | INTRAMUSCULAR | Status: AC
  Administered 2014-04-16: 4 mg via INTRAVENOUS
  Filled 2014-04-16: qty 2

## 2014-04-16 MED ORDER — RIVAROXABAN (XARELTO) EDUCATION KIT FOR DVT/PE PATIENTS
PACK | Freq: Once | Status: AC
  Administered 2014-04-16: 20:00:00
  Filled 2014-04-16: qty 1

## 2014-04-16 MED ORDER — HYDROMORPHONE HCL 1 MG/ML IJ SOLN
1.0000 mg | Freq: Once | INTRAMUSCULAR | Status: AC
  Administered 2014-04-16: 1 mg via INTRAVENOUS
  Filled 2014-04-16: qty 1

## 2014-04-16 NOTE — ED Notes (Signed)
Pt with knee surgery x2 weeks ago and swelling and pain that started in same leg x1 week ago. Was seen by orthopedist and told to come here for treatment of possible dvt. Nad noted. Pt states no sob or cp at this time.

## 2014-04-16 NOTE — Progress Notes (Signed)
Bilateral lower extremity venous duplex completed.  Right:  DVT noted in the common femoral, femoral, and popliteal veins.  No evidence of superficial thrombosis.  No Baker's cyst.  Left:  No evidence of DVT, superficial thrombosis, or Baker's cyst.

## 2014-04-16 NOTE — ED Notes (Signed)
Pt sent here after having vascular study with positive DVT today; pt sts recent sx on knee

## 2014-04-16 NOTE — Telephone Encounter (Signed)
This is Shane's patient (even though it lists me as PCP)--forwarding to him as FYI.

## 2014-04-16 NOTE — Telephone Encounter (Signed)
Dr. Rip Harbour just called to let you know that he sent pt for doppler study and she has DVT and he has sent her to the ER for that and he just wanted to let you know

## 2014-04-16 NOTE — Discharge Instructions (Signed)

## 2014-04-16 NOTE — ED Provider Notes (Signed)
CSN: [id number]     Arrival date & time 04/16/14  1250 History   First MD Initiated Contact with Patient 04/16/14 1655     Chief Complaint  Patient presents with  . DVT     (Consider location/radiation/quality/duration/timing/severity/associated sxs/prior Treatment) HPI Comments: Patient sent to ER for evaluation of DVT. Patient reports undergoing knee surgery 2 weeks ago. She presented today for postop check and was complaining of pain and swelling of the right leg. She was sent for a vascular study he was found to have a DVT in the right leg. Patient denies any swelling, redness or drainage at the surgical site. She has not had any chest pain, heart palpitations, shortness of breath or passing out. Patient reports constant moderate to severe pain in the right leg.   Past Medical History  Diagnosis Date  . Endometriosis   . PCOS (polycystic ovarian syndrome)   . Interstitial cystitis   . GAD (generalized anxiety disorder)   . Chronic sinusitis   . Polycystic kidney disease    Past Surgical History  Procedure Laterality Date  . Polycystic kindey disease      [place] kidney([place])  . Laparoscopy      X 2 ENDOMETRIOSIS.  [facility] Bladder surgery      BLADDER STRETCH X 3   Family History  Problem Relation Age of Onset  . Diabetes Mother   . Macular degeneration Mother   . Hyperlipidemia Mother   . Hypertension Mother   . Polycystic kidney disease Father   . Heart disease Father   . Diabetes Father   . Hepatitis Father     C  . Hypothyroidism Father   . Hypertension Father    History  Substance Use Topics  . Smoking status: Current Every Day Smoker -- 1.00 packs/day    Types: Cigarettes  . Smokeless tobacco: Not on file  . Alcohol Use: No   OB History   Grav Para Term Preterm Abortions TAB SAB Ect Mult Living   1 0   1     0     Review of Systems  Respiratory: Negative for shortness of breath.   Cardiovascular: Negative for chest pain.  All other systems  reviewed and are negative.     Allergies  Latex  Home Medications   Prior to Admission medications   Medication Sig Start Date End Date Taking? Authorizing Provider  acetaminophen (TYLENOL) 500 MG tablet Take 500 mg by mouth every 6 (six) hours as needed for moderate pain.   Yes Historical Provider, MD  amitriptyline (ELAVIL) 50 MG tablet Take 50 mg by mouth at bedtime.   Yes Historical Provider, MD  calcium-vitamin D (OSCAL WITH D) 500-200 MG-UNIT per tablet Take 1 tablet by mouth daily.     Yes Historical Provider, MD  Cyanocobalamin (B-12) 50 MCG TABS Take 50 mcg by mouth daily.    Yes Historical Provider, MD  HYDROcodone-acetaminophen (NORCO/VICODIN) 5-325 MG per tablet Take 2 tablets by mouth every 6 (six) hours as needed for moderate pain.   Yes Historical Provider, MD  Iron-Vitamin C (FE C PO) Take 1 tablet by mouth daily.    Yes Historical Provider, MD  Multiple Vitamin (MULTI-DAY VITAMINS PO) Take 1 tablet by mouth daily.     Yes Historical Provider, MD  PARoxetine (PAXIL) 20 MG tablet Take 20 mg by mouth daily.   Yes Historical Provider, MD  pentosan polysulfate (ELMIRON) 100 MG capsule Take 200 mg by mouth 2 (two) times daily.  Yes Historical Provider, MD  traMADol (ULTRAM) 50 MG tablet Take 1 tablet (50 mg total) by mouth every 6 (six) hours as needed for pain. 03/19/12  Yes Donnamae Jude, MD  varenicline (CHANTIX PAK) 0.5 MG X 11 & 1 MG X 42 tablet Take 1 mg by mouth daily. Take one 0.5 mg tablet by mouth once daily for 3 days, then increase to one 0.5 mg tablet twice daily for 4 days, then increase to one 1 mg tablet twice daily.   Yes Historical Provider, MD   BP 114/64  Pulse 90  Temp(Src) 98.8 F (37.1 C) (Oral)  Resp 18  Ht 5\' 1"  (1.549 m)  Wt 120 lb (54.432 kg)  BMI 22.69 kg/m2  SpO2 100% Physical Exam  Constitutional: She is oriented to person, place, and time. She appears well-developed and well-nourished. No distress.  HENT:  Head: Normocephalic and  atraumatic.  Right Ear: Hearing normal.  Left Ear: Hearing normal.  Nose: Nose normal.  Mouth/Throat: Oropharynx is clear and moist and mucous membranes are normal.  Eyes: Conjunctivae and EOM are normal. Pupils are equal, round, and reactive to light.  Neck: Normal range of motion. Neck supple.  Cardiovascular: Regular rhythm, S1 normal and S2 normal.  Exam reveals no gallop and no friction rub.   No murmur heard. Pulses:      Posterior tibial pulses are 2+ on the right side, and 2+ on the left side.  Pulmonary/Chest: Effort normal and breath sounds normal. No respiratory distress. She exhibits no tenderness.  Abdominal: Soft. Normal appearance and bowel sounds are normal. There is no hepatosplenomegaly. There is no tenderness. There is no rebound, no guarding, no tenderness at McBurney's point and negative Murphy's sign. No hernia.  Musculoskeletal: Normal range of motion.       Right lower leg: She exhibits tenderness and swelling.  Tenderness and swelling of right calf region. No venous cords present.  Surgical incision appears to be healing well, no redness, swelling or drainage  Neurological: She is alert and oriented to person, place, and time. She has normal strength. No cranial nerve deficit or sensory deficit. Coordination normal. GCS eye subscore is 4. GCS verbal subscore is 5. GCS motor subscore is 6.  Skin: Skin is warm, dry and intact. No rash noted. No cyanosis.  Psychiatric: She has a normal mood and affect. Her speech is normal and behavior is normal. Thought content normal.    ED Course  Procedures (including critical care time) Labs Review Labs Reviewed  CBC WITH DIFFERENTIAL - Abnormal; Notable for the following:    WBC 13.1 (*)    RBC 3.78 (*)    Hemoglobin 11.7 (*)    HCT 34.7 (*)    Neutro Abs 9.0 (*)    All other components within normal limits  BASIC METABOLIC PANEL - Abnormal; Notable for the following:    Potassium 3.6 (*)    All other components within  normal limits  PROTIME-INR  APTT    Imaging Review No results found.   EKG Interpretation None      MDM   Final diagnoses:  None   DVT right leg  Patient presents to the ER for evaluation of DVT. Patient had right knee surgery 2 weeks ago. Has developed pain and swelling of the right leg since surgery. She was sent for outpatient duplex today which confirmed DVT in the right leg. Baseline labs are unremarkable. Patient not experiencing any chest discomfort, heart palpitations, shortness of breath. No  concern for PE. Patient is appropriate for outpatient management with Xarelto. Patient initiated here in the ER. Followup with primary doctor.    Orpah Greek, MD 04/16/14 [phone]

## 2014-04-16 NOTE — ED Notes (Signed)
Pharmacist at the bedside educating the patient.

## 2014-04-19 NOTE — Telephone Encounter (Signed)
Hey Sharon Bowman Can you please take care of this?

## 2014-04-19 NOTE — Telephone Encounter (Signed)
Make f/u appt with me for DVT and new medication.  Ask the front to change her PCM to me.

## 2014-04-20 NOTE — Telephone Encounter (Signed)
Done

## 2014-04-21 ENCOUNTER — Encounter: Payer: Self-pay | Admitting: Medical

## 2014-04-21 ENCOUNTER — Ambulatory Visit (INDEPENDENT_AMBULATORY_CARE_PROVIDER_SITE_OTHER): Payer: BC Managed Care – PPO | Admitting: Medical

## 2014-04-21 ENCOUNTER — Telehealth: Payer: Self-pay | Admitting: Medical

## 2014-04-21 VITALS — BP 100/70 | HR 100 | Temp 97.7°F | Resp 16 | Wt 120.0 lb

## 2014-04-21 DIAGNOSIS — I82401 Acute embolism and thrombosis of unspecified deep veins of right lower extremity: Secondary | ICD-10-CM

## 2014-04-21 DIAGNOSIS — F17200 Nicotine dependence, unspecified, uncomplicated: Secondary | ICD-10-CM

## 2014-04-21 DIAGNOSIS — Z975 Presence of (intrauterine) contraceptive device: Secondary | ICD-10-CM

## 2014-04-21 DIAGNOSIS — Z720 Tobacco use: Secondary | ICD-10-CM

## 2014-04-21 DIAGNOSIS — Q613 Polycystic kidney, unspecified: Secondary | ICD-10-CM

## 2014-04-21 NOTE — Telephone Encounter (Signed)
I reviewed the chart records.    Recommendations:  C/t Xarelto, change to 20mg  once daily at day 22  She can go ahead and walk/resume routine day to day activity.   Avoid sitting/lying for hours at a time.   Stay active.  Avoid injury or trauma to the body  In 2 weeks, consider adding OTC or prescription compression hose to wear daily for leg compression/improving circulation  Make every effort with [facility] and 1-800-QUIT-NOW hotline for free counseling to STOP tobacco  Given her other concerns, it is reasonable to c/t IUD.  Certainly a risk/benefit issue, but although there is some increased risk of recurrence of DVT/clot, latest studies suggest IUD is still reasonably safe in setting of DVT.  I would not recommend any other hormonal contraception.  The other option would be copper IUD, assuming no other contraindications, but she can ask gynecology about this  I want her to come in for BMET to check kidney function in 2-3 weeks  The plan would be to stay on Xarelto for 3 months and then recheck ultrasound of leg.    There is no major reason why we have to refer to hematology, but if she insists then refer.   Print and mail her the AV summary.

## 2014-04-21 NOTE — Patient Instructions (Signed)
Deep Vein Thrombosis A deep vein thrombosis (DVT) is a blood clot that develops in the deep, larger veins of the leg, arm, or pelvis. These are more dangerous than clots that might form in veins near the surface of the body. A DVT can lead to serious and even life-threatening complications if the clot breaks off and travels in the bloodstream to the lungs.  A DVT can damage the valves in your leg veins so that instead of flowing upward, the blood pools in the lower leg. This is called post-thrombotic syndrome, and it can result in pain, swelling, discoloration, and sores on the leg. CAUSES Usually, several things contribute to the formation of blood clots. Contributing factors include:  The flow of blood slows down.  The inside of the vein is damaged in some way.  You have a condition that makes blood clot more easily. RISK FACTORS Some people are more likely than others to develop blood clots. Risk factors include:   Smoking.  Being overweight (obese).  Sitting or lying still for a long time. This includes long-distance travel, paralysis, or recovery from an illness or surgery. Other factors that increase risk are:   Older age, especially over 65 years of age.  Having a family history of blood clots or if you have already had a blot clot.  Having major or lengthy surgery. This is especially true for surgery on the hip, knee, or belly (abdomen). Hip surgery is particularly high risk.  Having a long, thin tube (catheter) placed inside a vein during a medical procedure.  Breaking a hip or leg.  Having cancer or cancer treatment.  Pregnancy and childbirth.  Hormone changes make the blood clot more easily during pregnancy.  The fetus puts pressure on the veins of the pelvis.  There is a risk of injury to veins during delivery or a caesarean delivery. The risk is highest just after childbirth.  Medicines containing the female hormone estrogen. This includes birth control pills and  hormone replacement therapy.  Other circulation or heart problems.  SIGNS AND SYMPTOMS When a clot forms, it can either partially or totally block the blood flow in that vein. Symptoms of a DVT can include:  Swelling of the leg or arm, especially if one side is much worse.  Warmth and redness of the leg or arm, especially if one side is much worse.  Pain in an arm or leg. If the clot is in the leg, symptoms may be more noticeable or worse when standing or walking. The symptoms of a DVT that has traveled to the lungs (pulmonary embolism, PE) usually start suddenly and include:  Shortness of breath.  Coughing.  Coughing up blood or blood-tinged mucus.  Chest pain. The chest pain is often worse with deep breaths.  Rapid heartbeat. Anyone with these symptoms should get emergency medical treatment right away. Do not wait to see if the symptoms will go away. Call your local emergency services (911 in the [place] if you have these symptoms. Do not drive yourself to the hospital. DIAGNOSIS If a DVT is suspected, your health care provider will take a full medical history and perform a physical exam. Tests that also may be required include:  Blood tests, including studies of the clotting properties of the blood.  Ultrasound to see if you have clots in your legs or lungs.  X-rays to show the flow of blood when dye is injected into the veins (venogram).  Studies of your lungs if you have any  chest symptoms. PREVENTION  Exercise the legs regularly. Take a brisk 30-minute walk every day.  Maintain a weight that is appropriate for your height.  Avoid sitting or lying in bed for long periods of time without moving your legs.  Women, particularly those over the age of 28 years, should consider the risks and benefits of taking estrogen medicines, including birth control pills.  Do not smoke, especially if you take estrogen medicines.  Long-distance travel can increase your risk of DVT. You  should exercise your legs by walking or pumping the muscles every hour.  Many of the risk factors above relate to situations that exist with hospitalization, either for illness, injury, or elective surgery. Prevention may include medical and nonmedical measures.  Your health care provider will assess you for the need for venous thromboembolism prevention when you are admitted to the hospital. If you are having surgery, your surgeon will assess you the day of or day after surgery. TREATMENT Once identified, a DVT can be treated. It can also be prevented in some circumstances. Once you have had a DVT, you may be at increased risk for a DVT in the future. The most common treatment for DVT is blood-thinning (anticoagulant) medicine, which reduces the blood's tendency to clot. Anticoagulants can stop new blood clots from forming and stop old clots from growing. They cannot dissolve existing clots. Your body does this by itself over time. Anticoagulants can be given by mouth, through an IV tube, or by injection. Your health care provider will determine the best program for you. Other medicines or treatments that may be used are:  Heparin or related medicines (low molecular weight heparin) are often the first treatment for a blood clot. They act quickly. However, they cannot be taken orally and must be given either in shot form or by IV tube.  Heparin can cause a fall in a component of blood that stops bleeding and forms blood clots (platelets). You will be monitored with blood tests to be sure this does not occur.  Warfarin is an anticoagulant that can be swallowed. It takes a few days to start working, so usually heparin or related medicines are used in combination. Once warfarin is working, heparin is usually stopped.  Factor Xa inhibitor medicines, such as rivaroxaban and apixaban, also reduce blood clotting. These medicines are taken orally and can often be used without heparin or related  medicines.  Less commonly, clot dissolving drugs (thrombolytics) are used to dissolve a DVT. They carry a high risk of bleeding, so they are used mainly in severe cases where your life or a part of your body is threatened.  Very rarely, a blood clot in the leg needs to be removed surgically.  If you are unable to take anticoagulants, your health care provider may arrange for you to have a filter placed in a main vein in your abdomen. This filter prevents clots from traveling to your lungs. HOME CARE INSTRUCTIONS  Take all medicines as directed by your health care provider.  Learn as much as you can about DVT.  Wear a medical alert bracelet or carry a medical alert card.  Ask your health care provider how soon you can go back to normal activities. It is important to stay active to prevent blood clots. If you are on anticoagulant medicine, avoid contact sports.  It is very important to exercise. This is especially important while traveling, sitting, or standing for long periods of time. Exercise your legs by walking or by  tightening and relaxing your leg muscles regularly. Take frequent walks.  You may need to wear compression stockings. These are tight elastic stockings that apply pressure to the lower legs. This pressure can help keep the blood in the legs from clotting. Taking Warfarin Warfarin is a daily medicine that is taken by mouth. Your health care provider will advise you on the length of treatment (usually 3-6 months, sometimes lifelong). If you take warfarin:  Understand how to take warfarin and foods that can affect how warfarin works in Veterinary surgeon.  Too much and too little warfarin are both dangerous. Too much warfarin increases the risk of bleeding. Too little warfarin continues to allow the risk for blood clots. Warfarin and Regular Blood Testing While taking warfarin, you will need to have regular blood tests to measure your blood clotting time. These blood tests usually  include both the prothrombin time (PT) and international normalized ratio (INR) tests. The PT and INR results allow your health care provider to adjust your dose of warfarin. It is very important that you have your PT and INR tested as often as directed by your health care provider.  Warfarin and Your Diet Avoid major changes in your diet, or notify your health care provider before changing your diet. Arrange a visit with a registered dietitian to answer your questions. Many foods, especially foods high in vitamin K, can interfere with warfarin and affect the PT and INR results. You should eat a consistent amount of foods high in vitamin K. Foods high in vitamin K include:   Spinach, kale, broccoli, cabbage, collard and turnip greens, Brussels sprouts, peas, cauliflower, seaweed, and parsley.  Beef and pork liver.  Green tea.  Soybean oil. Warfarin with Other Medicines Many medicines can interfere with warfarin and affect the PT and INR results. You must:  Tell your health care provider about any and all medicines, vitamins, and supplements you take, including aspirin and other over-the-counter anti-inflammatory medicines. Be especially cautious with aspirin and anti-inflammatory medicines. Ask your health care provider before taking these.  Do not take or discontinue any prescribed or over-the-counter medicine except on the advice of your health care provider or pharmacist. Warfarin Side Effects Warfarin can have side effects, such as easy bruising and difficulty stopping bleeding. Ask your health care provider or pharmacist about other side effects of warfarin. You will need to:  Hold pressure over cuts for longer than usual.  Notify your dentist and other health care providers that you are taking warfarin before you undergo any procedures where bleeding may occur. Warfarin with Alcohol and Tobacco   Drinking alcohol frequently can increase the effect of warfarin, leading to excess  bleeding. It is best to avoid alcoholic drinks or to consume only very small amounts while taking warfarin. Notify your health care provider if you change your alcohol intake.   Do not use any tobacco products including cigarettes, chewing tobacco, or electronic cigarettes. If you smoke, quit. Ask your health care provider for help with quitting smoking. Alternative Medicines to Warfarin: Factor Xa Inhibitor Medicines  These blood-thinning medicines are taken by mouth, usually for several weeks or longer. It is important to take the medicine every single day at the same time each day.  There are no regular blood tests required when using these medicines.  There are fewer food and drug interactions than with warfarin.  The side effects of this class of medicine are similar to those of warfarin, including excessive bruising or bleeding. Ask your  health care provider or pharmacist about other potential side effects. SEEK MEDICAL CARE IF:  You notice a rapid heartbeat.  You feel weaker or more tired than usual.  You feel faint.  You notice increased bruising.  You feel your symptoms are not getting better in the time expected.  You believe you are having side effects of medicine. SEEK IMMEDIATE MEDICAL CARE IF:  You have chest pain.  You have trouble breathing.  You have new or increased swelling or pain in one leg.  You cough up blood.  You notice blood in vomit, in a bowel movement, or in urine. MAKE SURE YOU:  Understand these instructions.  Will watch your condition.  Will get help right away if you are not doing well or get worse. Document Released: 06/18/2005 Document Revised: 11/02/2013 Document Reviewed: 02/23/2013 [facility] Patient Information 2015 [place], [place]. This information is not intended to replace advice given to you by your health care provider. Make sure you discuss any questions you have with your health care provider.    Activity Guidelines Once  DVT occurs, getting around may become more difficult at first. You should gradually return to your normal activities. If your legs feel swollen or heavy, lie in bed with your heels elevated 5 to 6 inches. This will help improve circulation and decrease your leg swelling.  In addition:  Exercise your lower leg muscles if you are sitting still for long periods of time. Stand up and walk for a few minutes every hour while awake. Don't wear tight-fitting clothing that could decrease the circulation in your legs. Wear compression stockings as recommended by your doctor. Avoid activities that may cause a serious injury.

## 2014-04-21 NOTE — Progress Notes (Signed)
Subjective: Here for emergency dept f/u.   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  On her post op check, she reported right calve swelling, redness, pain, was sent for Ultrasound ,was found to have several DVTs in the right leg, and was sent to the ED.   ED evaluated her and stated her on Xarelto.  She does report having heparin during the perioperative period for clot prevention.  Currently she does note improvement in swelling and pain, is compliant with Xarelto 15mg  BID.  She is aware to change to 20mg  once daily at day 22.     She denies hx/o DVT/PE in self or family, no self or family hx/o clotting disorders  She denies hx/o cancer, no recent long travel leading up to or after surgery.  No other recent trauma or injury, no bedridden status.  She is however a smoker, 38yo, has IUD in place.   She has been talking with gynecology, and she is reluctant to stopping IUD as this has helped control uterine bleeding issues she has had.   Her gynecologist wanted her to see a hematologist.  She has begin Chantix to help stop tobacco, but she feels that she will have difficulty stopping tobacco.   Review of Systems Constitutional: -fever, -chills, -sweats, -unexpected weight change,-fatigue Cardiology:  -chest pain, -palpitations Respiratory: -cough, -shortness of breath, -wheezing Gastroenterology: -abdominal pain, -nausea, -vomiting, -diarrhea, -constipation Hematology: -bleeding or bruising problems Ophthalmology: -vision changes Urology: -dysuria, -difficulty urinating, -hematuria, -urinary frequency, -urgency Neurology: -headache, -weakness, -tingling, -numbness    Objective: BP 100/70  Pulse 100  Temp(Src) 97.7 F (36.5 C) (Oral)  Resp 16  Wt 120 lb (54.432 kg)  General appearance: alert, no distress, WD/WN Neck: supple, no lymphadenopathy, no thyromegaly, no masses Heart: RRR, normal S1, S2, no  murmurs Lungs: CTA bilaterally, no wheezes, rhonchi, or rales Pulses: 2+ symmetric, upper and lower extremities, normal cap refill Right calve mild tenderness, no obvious asymmetry, right leg with no swelling, no erythema, no palpable cord, left leg exam unremarkable Ext: no cyanosis, no clubbing, no edema   Assessment: Encounter Diagnoses  Name Primary?  Sharon Bowman DVT of lower extremity (deep venous thrombosis), right Yes  . Smoker   . IUD (intrauterine device) in place   . Polycystic kidney disease      Plan: Reviewed 04/16/14 ED report.   Discussed diagnosis, treatment.  Discussed risks factors for DVT.   In her case ,she is a 38yo smoker with IUD in place/Mirena, and DVT occurred s/p knee surgery.   No reason at this time to pursue hypercoagulable lab panel.  strongly encouraged her to make efforts to stop tobacco, and she has begun Chantix per gynecology.   Given latest Up To Date reviewed, IUD safest method for contraception or for her uterine bleeding issues, so she would rather not stop this but understands it still possibly increases risk for DVT/PE recurrence.   Given lack of swellig and limited pain today, begin reasonable exercise/activity.  Discussed preventing additional clots, avoid re injury.    Plan to recheck BMET in 2 weeks given her underlying kidney disease.  discussed risks/benefits of Xarelto.  The plan will be to c/t Xarelto for 3-6 months.  Consider repeat ultrasound in 3 months given the findings.

## 2014-04-22 ENCOUNTER — Other Ambulatory Visit: Payer: Self-pay

## 2014-04-22 DIAGNOSIS — I82401 Acute embolism and thrombosis of unspecified deep veins of right lower extremity: Secondary | ICD-10-CM

## 2014-04-22 NOTE — Telephone Encounter (Signed)
Spoke with patient and reported all. Mailed/faxed AV summary. Advised her to schedule appointment here in 2-3 weeks for labs, and she would like to be referred to hematologist.

## 2014-04-22 NOTE — Telephone Encounter (Signed)
Called [facility] they took information and said they would make sure patient was scheduled through [facility]. I put in orders.

## 2014-04-26 ENCOUNTER — Inpatient Hospital Stay (HOSPITAL_COMMUNITY): Admission: RE | Admit: 2014-04-26 | Payer: BC Managed Care – PPO | Source: Ambulatory Visit

## 2014-04-27 ENCOUNTER — Telehealth: Payer: Self-pay | Admitting: Medical

## 2014-04-27 NOTE — Telephone Encounter (Signed)
Please call re hematology referral status, states Abigail Butts told her to call if she had not heard from [place] by Monday

## 2014-04-27 NOTE — Telephone Encounter (Signed)
Patient is aware that hematology is going to call her for her appointment

## 2014-04-28 ENCOUNTER — Telehealth: Payer: Self-pay | Admitting: Medical

## 2014-04-28 NOTE — Telephone Encounter (Signed)
pls call and speak with Dr. Rivka Barbara assistant/nurse.  Please double check what they were wanting hematology referral for?  Patient saw me in follow up for DVT, and requested hematology referral.   We are handling/treating her DVT.   I didn't know if there was some other reason she needed to see hematology.  Please call and find out.   If it is to treat/manage her DVT, we do that here.  She had reasons to have a DVT, so really doesn't need a hypercoagulable workup at this time.    Also, please send my OV note to [place].

## 2014-04-28 NOTE — Telephone Encounter (Signed)
Please, call

## 2014-04-29 NOTE — Telephone Encounter (Signed)
See my notes Andria Frames, thanks

## 2014-04-29 NOTE — Telephone Encounter (Signed)
Dr. Georgena Spurling office called at 4:55 and said that they will check and see what she wants Sharon Bowman to do regarding follow up regarding DVT.

## 2014-04-29 NOTE — Telephone Encounter (Signed)
LM with Dr.Foglemans nurse to call back to discuss.

## 2014-04-29 NOTE — Telephone Encounter (Signed)
Sent OV notes to [employer] per [employer].

## 2014-04-30 ENCOUNTER — Ambulatory Visit (HOSPITAL_COMMUNITY): Admission: RE | Admit: 2014-04-30 | Payer: BC Managed Care – PPO | Source: Ambulatory Visit | Admitting: Obstetrics

## 2014-04-30 ENCOUNTER — Telehealth: Payer: Self-pay | Admitting: Oncology

## 2014-04-30 ENCOUNTER — Encounter (HOSPITAL_COMMUNITY): Admission: RE | Payer: Self-pay | Source: Ambulatory Visit

## 2014-04-30 SURGERY — ROBOTIC ASSISTED TOTAL HYSTERECTOMY WITH BILATERAL SALPINGO OOPHORECTOMY
Anesthesia: General

## 2014-04-30 NOTE — Telephone Encounter (Signed)
S/W PT IN REF TO NP APPT. ON 05/12/14@1 :30 DX-DVT

## 2014-05-03 ENCOUNTER — Telehealth: Payer: Self-pay | Admitting: Oncology

## 2014-05-03 ENCOUNTER — Encounter: Payer: Self-pay | Admitting: Medical

## 2014-05-03 NOTE — Telephone Encounter (Signed)
Delivered chart to Dr. Alen Blew

## 2014-05-11 ENCOUNTER — Telehealth: Payer: Self-pay | Admitting: Medical Oncology

## 2014-05-11 NOTE — Telephone Encounter (Signed)
Confirmed tomorrow's appt with patient. Patient knows to bring current list of medications. Denies questions at this time.

## 2014-05-12 ENCOUNTER — Other Ambulatory Visit: Payer: BC Managed Care – PPO

## 2014-05-12 ENCOUNTER — Ambulatory Visit: Payer: BC Managed Care – PPO

## 2014-05-12 ENCOUNTER — Telehealth: Payer: Self-pay | Admitting: Medical Oncology

## 2014-05-12 ENCOUNTER — Ambulatory Visit (HOSPITAL_BASED_OUTPATIENT_CLINIC_OR_DEPARTMENT_OTHER): Payer: BC Managed Care – PPO | Admitting: Oncology

## 2014-05-12 VITALS — BP 126/71 | HR 102 | Temp 98.2°F | Resp 18 | Ht 61.0 in | Wt 131.8 lb

## 2014-05-12 DIAGNOSIS — Q613 Polycystic kidney, unspecified: Secondary | ICD-10-CM

## 2014-05-12 DIAGNOSIS — I824Y1 Acute embolism and thrombosis of unspecified deep veins of right proximal lower extremity: Secondary | ICD-10-CM

## 2014-05-12 DIAGNOSIS — Z9889 Other specified postprocedural states: Secondary | ICD-10-CM

## 2014-05-12 DIAGNOSIS — O2230 Deep phlebothrombosis in pregnancy, unspecified trimester: Secondary | ICD-10-CM

## 2014-05-12 NOTE — Telephone Encounter (Signed)
Patient called requesting to know who to call for prescription refills of her blood thinner. Reviewed with MD and per MD, informed patient to contact her PCP for refills. Patient expressed understanding. Denies further questions at this time.

## 2014-05-12 NOTE — Progress Notes (Signed)
Reason for Referral: DVT.  HPI: 38 year old woman with history of endometriosis and polycystic ovarian syndrome referred to me for the recent diagnosis of deep vein thrombosis. She was in her usual state of health until she started developing knee pain for the last 6 months. She subsequently underwent right knee operation in October 2015. She describes the operation as well as removal of a tumor which believed to be an osteochondroma. 2-1/2 weeks later she started developing right thigh swelling and leg swelling and subsequently was seen for postoperative checkup and was referred to the emergency department for an evaluation. She was seen in the emergency department on 04/16/2014 and ultrasound Doppler revealed acute DVT thrombosis involving the right common femoral, femoral and popliteal vein noted. No left deep vein thrombosis at that time. She was started on Xarelto 50 mg twice a day and subsequently was changed to 20 mg daily. She does not report any history of any deep pain thrombosis in the past. She does not report any pregnancy complications previously. She is currently an active smoker and has an IUD for at the reception. She has been on oral contraceptives still about age 38 and those were discontinued. Clinically, she is reporting some improvement in the pain in her right knee and decreasing the swelling. She still having some issues with standing on her right knee for extended period of time. She does not report any headaches or blurry vision or syncope. She does not report any fevers, chills, sweats or weight loss. She does not report any priorhistory of deep vein thrombosis, pulmonary embolism, phlebitis, strokes or myocardial infarctions. She does not report any chest pain, shortness of breath difficulty breathing. She does report increased anxiety, insomnia and occasional nausea. She did report one episode of vomiting but that has subsided. She does not report any frequency, urgency or hesitancy.  Rest of the review of systems unremarkable.  Past Medical History  Diagnosis Date  . Endometriosis   . PCOS (polycystic ovarian syndrome)   . Interstitial cystitis   . GAD (generalized anxiety disorder)   . Chronic sinusitis   . Polycystic kidney disease   :  Past Surgical History  Procedure Laterality Date  . Polycystic kindey disease      [place] [facility])  . Laparoscopy      X 2 ENDOMETRIOSIS.  [facility] Kitchen Bladder surgery      BLADDER STRETCH X 3  :  Current outpatient prescriptions: acetaminophen (TYLENOL) 500 MG tablet, Take 500 mg by mouth every 6 (six) hours as needed for moderate pain., Disp: , Rfl: ;  amitriptyline (ELAVIL) 50 MG tablet, Take 50 mg by mouth at bedtime., Disp: , Rfl: ;  calcium-vitamin D (OSCAL WITH D) 500-200 MG-UNIT per tablet, Take 1 tablet by mouth daily.  , Disp: , Rfl: ;  Cyanocobalamin (B-12) 50 MCG TABS, Take 50 mcg by mouth daily. , Disp: , Rfl:  HYDROcodone-acetaminophen (NORCO/VICODIN) 5-325 MG per tablet, Take 2 tablets by mouth every 6 (six) hours as needed for moderate pain., Disp: , Rfl: ;  Iron-Vitamin C (FE C PO), Take 1 tablet by mouth daily. , Disp: , Rfl: ;  Multiple Vitamin (MULTI-DAY VITAMINS PO), Take 1 tablet by mouth daily.  , Disp: , Rfl: ;  PARoxetine (PAXIL) 40 MG tablet, , Disp: , Rfl: 8 pentosan polysulfate (ELMIRON) 100 MG capsule, Take 200 mg by mouth 2 (two) times daily., Disp: , Rfl: ;  traMADol (ULTRAM) 50 MG tablet, Take 1 tablet (50 mg total) by mouth every  6 (six) hours as needed for pain., Disp: 60 tablet, Rfl: 3 varenicline (CHANTIX PAK) 0.5 MG X 11 & 1 MG X 42 tablet, Take 1 mg by mouth daily. Take one 0.5 mg tablet by mouth once daily for 3 days, then increase to one 0.5 mg tablet twice daily for 4 days, then increase to one 1 mg tablet twice daily., Disp: , Rfl:  XARELTO STARTER PACK 15 & 20 MG TBPK, Take 15-20 mg by mouth as directed. Take as directed on package: Start with one 15mg  tablet by mouth twice a day with food.  On Day 22, switch to one 20mg  tablet once a day with food., Disp: 51 each, Rfl: 0 Current facility-administered medications: medroxyPROGESTERone (DEPO-PROVERA) injection 150 mg, 150 mg, Intramuscular, Q90 days, Mora Bellman, MD, 150 mg at 02/29/12 1129:  Allergies  Allergen Reactions  . Latex Itching and Rash  :  Family History  Problem Relation Age of Onset  . Diabetes Mother   . Macular degeneration Mother   . Hyperlipidemia Mother   . Hypertension Mother   . Polycystic kidney disease Father   . Heart disease Father   . Diabetes Father   . Hepatitis Father     C  . Hypothyroidism Father   . Hypertension Father   :  History   Social History  . Marital Status: Single    Spouse Name: N/A    Number of Children: N/A  . Years of Education: N/A   Occupational History  . Not on file.   Social History Main Topics  . Smoking status: Current Every Day Smoker -- 1.00 packs/day    Types: Cigarettes  . Smokeless tobacco: Not on file  . Alcohol Use: No  . Drug Use: Yes     Comment: marijuana use daily  . Sexual Activity: No   Other Topics Concern  . Not on file   Social History Narrative  :  Pertinent items are noted in HPI.  Exam: ECOG 0 Blood pressure 126/71, pulse 102, temperature 98.2 F (36.8 C), temperature source Oral, resp. rate 18, height 5\' 1"  (1.549 m), weight 131 lb 12.8 oz (59.784 kg). General appearance: alert and cooperative Head: Normocephalic, without obvious abnormality Throat: lips, mucosa, and tongue normal; teeth and gums normal Neck: no adenopathy Back: negative Resp: clear to auscultation bilaterally Chest wall: no tenderness Cardio: regular rate and rhythm, S1, S2 normal, no murmur, click, rub or gallop GI: soft, non-tender; bowel sounds normal; no masses,  no organomegaly Extremities: extremities normal, atraumatic, no cyanosis or edema Pulses: 2+ and symmetric Skin: Skin color, texture, turgor normal. No rashes or lesions Lymph nodes:  Cervical, supraclavicular, and axillary nodes normal.   Assessment and Plan:    38 year old woman with the following issues:  1. Right leg DVT diagnosed in October 2015. Provoking factors including an orthopedic operation, smoking history and potential and mobility. She has an IUD but a history of being on oral contraceptives 3 years ago. I doubt there is inherited thrombophilia especially in the setting of no family history as well. Given the fact that this is a first provoked deep vein thrombosis I agree with full dose anticoagulation in the form of a Xarelto for 3 months. If she develops any future thrombosis then we will consider a hypercoagulable panel at that time.  2. Anemia: Could be related to endometriosis and possible menstrual bleeding. She is scheduled to have a hysterectomy in the future but this will be postponed given her recent blood clot.  All her questions were answered today and I willl be happy to see her in the future as needed

## 2014-05-13 ENCOUNTER — Telehealth: Payer: Self-pay

## 2014-05-13 NOTE — Telephone Encounter (Signed)
Patient left message to call her back regarding her appointment with hematologist. I tried, and I left message.

## 2014-05-14 ENCOUNTER — Other Ambulatory Visit: Payer: Self-pay | Admitting: Medical

## 2014-05-14 MED ORDER — RIVAROXABAN 20 MG PO TABS: 20.0000 mg | ORAL_TABLET | Freq: Every day | ORAL | Status: DC

## 2014-05-14 NOTE — Telephone Encounter (Signed)
Patient needs a refill on the [place]. Hematology would not refill the medication

## 2014-05-14 NOTE — Telephone Encounter (Signed)
I have reviewed the hematology notes and wonder if they told her anything that did not already tell her?  xarelto sent.

## 2014-05-17 NOTE — Telephone Encounter (Signed)
Left detailed message on pt Vm and to call and schedule 3 month follow-up

## 2014-05-17 NOTE — Telephone Encounter (Signed)
After reviewing hematology notes, it seems that the hematologist and I are both in agreement with the treatment plan, to continue Xarelto for at least 3 months. This was the same information that along with other things we already discussed when I saw her.  Unless she is having other new problems let us see her back in 3 months

## 2014-05-17 NOTE — Telephone Encounter (Signed)
Hematology did not tell her anything. Pt was notified that med was sent to pharmacy

## 2014-06-22 ENCOUNTER — Other Ambulatory Visit: Payer: BC Managed Care – PPO

## 2014-06-22 ENCOUNTER — Telehealth: Payer: Self-pay | Admitting: Medical

## 2014-06-22 DIAGNOSIS — M19071 Primary osteoarthritis, right ankle and foot: Secondary | ICD-10-CM

## 2014-06-22 DIAGNOSIS — M19072 Primary osteoarthritis, left ankle and foot: Secondary | ICD-10-CM

## 2014-06-22 LAB — CBC WITH DIFFERENTIAL/PLATELET
BASOS PCT: 0 % (ref 0–1)
Basophils Absolute: 0 10*3/uL (ref 0.0–0.1)
Eosinophils Absolute: 0 10*3/uL (ref 0.0–0.7)
Eosinophils Relative: 0 % (ref 0–5)
HCT: 39.3 % (ref 36.0–46.0)
HEMOGLOBIN: 12.9 g/dL (ref 12.0–15.0)
LYMPHS ABS: 1 10*3/uL (ref 0.7–4.0)
Lymphocytes Relative: 7 % — ABNORMAL LOW (ref 12–46)
MCH: 30.6 pg (ref 26.0–34.0)
MCHC: 32.8 g/dL (ref 30.0–36.0)
MCV: 93.1 fL (ref 78.0–100.0)
MPV: 10.5 fL (ref 9.4–12.4)
Monocytes Absolute: 0.6 10*3/uL (ref 0.1–1.0)
Monocytes Relative: 4 % (ref 3–12)
NEUTROS PCT: 89 % — AB (ref 43–77)
Neutro Abs: 13.3 10*3/uL — ABNORMAL HIGH (ref 1.7–7.7)
Platelets: 332 10*3/uL (ref 150–400)
RBC: 4.22 MIL/uL (ref 3.87–5.11)
RDW: 13.4 % (ref 11.5–15.5)
WBC: 14.9 10*3/uL — ABNORMAL HIGH (ref 4.0–10.5)

## 2014-06-22 LAB — RHEUMATOID FACTOR: Rhuematoid fact SerPl-aCnc: <10 IU/mL (ref <=14)

## 2014-06-22 LAB — C-REACTIVE PROTEIN: CRP: <0.5 mg/dL (ref <0.60)

## 2014-06-22 NOTE — Telephone Encounter (Signed)
Done

## 2014-06-22 NOTE — Telephone Encounter (Signed)
Pt came in for labs ordered from another office. She is requesting that in addition to sending labs to that facility that we also send to her kidney doc. Dr. Marval Regal at [facility].

## 2014-06-23 LAB — SEDIMENTATION RATE: SED RATE: 1 mm/h (ref 0–22)

## 2014-06-24 LAB — HLA-B27 ANTIGEN: DNA RESULT: NOT DETECTED

## 2014-07-01 ENCOUNTER — Telehealth: Payer: Self-pay | Admitting: Internal Medicine

## 2014-07-01 NOTE — Telephone Encounter (Signed)
Faxed over medical records to [facility] @ [phone]

## 2014-08-05 ENCOUNTER — Ambulatory Visit
Admission: RE | Admit: 2014-08-05 | Discharge: 2014-08-05 | Disposition: A | Discharge status: Home / Self Care | Payer: Self-pay | Source: Ambulatory Visit | Attending: Nephrology | Admitting: Nephrology

## 2014-08-05 ENCOUNTER — Other Ambulatory Visit: Payer: Self-pay | Admitting: Nephrology

## 2014-08-05 DIAGNOSIS — R52 Pain, unspecified: Secondary | ICD-10-CM

## 2014-08-05 DIAGNOSIS — Z86718 Personal history of other venous thrombosis and embolism: Secondary | ICD-10-CM

## 2014-08-05 DIAGNOSIS — R609 Edema, unspecified: Secondary | ICD-10-CM

## 2014-08-17 ENCOUNTER — Encounter: Payer: Self-pay | Admitting: Medical

## 2014-08-23 ENCOUNTER — Telehealth: Payer: Self-pay | Admitting: Hematology

## 2014-08-23 NOTE — Telephone Encounter (Signed)
lt mess regaring appt on 09/13/14 at 1:30 w/Feng Dx:  Perioperative thromboprophylaxis Referring Dr. Pamala Hurry

## 2014-08-26 ENCOUNTER — Telehealth: Payer: Self-pay | Admitting: Oncology

## 2014-08-26 NOTE — Telephone Encounter (Signed)
sw pt to sched appt....pt did nto want to sched at this time....she will call to sched after her hyster. surgery

## 2014-08-27 ENCOUNTER — Telehealth: Payer: Self-pay | Admitting: Hematology

## 2014-08-27 NOTE — Telephone Encounter (Signed)
Called patient 2nd time on 08/27/14.  TG

## 2014-09-08 ENCOUNTER — Emergency Department (HOSPITAL_COMMUNITY)
Admission: EM | Admit: 2014-09-08 | Discharge: 2014-09-09 | Disposition: A | Discharge status: Home / Self Care | Payer: BLUE CROSS/BLUE SHIELD | Attending: Emergency Medicine | Admitting: Emergency Medicine

## 2014-09-08 ENCOUNTER — Encounter (HOSPITAL_COMMUNITY): Payer: Self-pay | Admitting: Nurse Practitioner

## 2014-09-08 DIAGNOSIS — Z87891 Personal history of nicotine dependence: Secondary | ICD-10-CM | POA: Diagnosis not present

## 2014-09-08 DIAGNOSIS — M79604 Pain in right leg: Secondary | ICD-10-CM | POA: Insufficient documentation

## 2014-09-08 DIAGNOSIS — Z8639 Personal history of other endocrine, nutritional and metabolic disease: Secondary | ICD-10-CM | POA: Diagnosis not present

## 2014-09-08 DIAGNOSIS — Z86718 Personal history of other venous thrombosis and embolism: Secondary | ICD-10-CM | POA: Diagnosis not present

## 2014-09-08 DIAGNOSIS — F411 Generalized anxiety disorder: Secondary | ICD-10-CM | POA: Insufficient documentation

## 2014-09-08 DIAGNOSIS — Z79899 Other long term (current) drug therapy: Secondary | ICD-10-CM | POA: Diagnosis not present

## 2014-09-08 DIAGNOSIS — Z87718 Personal history of other specified (corrected) congenital malformations of genitourinary system: Secondary | ICD-10-CM | POA: Insufficient documentation

## 2014-09-08 DIAGNOSIS — Z87448 Personal history of other diseases of urinary system: Secondary | ICD-10-CM | POA: Insufficient documentation

## 2014-09-08 DIAGNOSIS — Z91040 Latex allergy status: Secondary | ICD-10-CM | POA: Insufficient documentation

## 2014-09-08 DIAGNOSIS — Z8742 Personal history of other diseases of the female genital tract: Secondary | ICD-10-CM | POA: Diagnosis not present

## 2014-09-08 HISTORY — DX: Acute embolism and thrombosis of unspecified deep veins of unspecified lower extremity: I82.409

## 2014-09-08 NOTE — ED Provider Notes (Signed)
This chart was scribed for [place], DO by Delphia Grates, ED Scribe. This patient was seen in room A10C/A10C and the patient's care was started at 11:57 PM.  TIME SEEN: 2357  CHIEF COMPLAINT: Leg Pain  HPI:  HPI Comments: Sharon Bowman is a 39 y.o. female with prior history of DVT after a leg surgery no longer on Xarelto but does take aspirin who presents to the Emergency Department complaining of 10/10 pain to the RLE that began earlier today. Patient reports hisstory of right knee surgery on April 02, 2014 for a tumor removal. She reports a DVT during that time and was treated with Xarelto for 3 months. Patient states she is now taking aspirin. There is now associated swelling and redness to the RLE and patient is concerned for possible DVT. She denies recent injury or trauma. She denies chest pain, SOB, or fever. No numbness, tingling or focal weakness.  PCP: Tysinger  ROS: See HPI Constitutional: no fever  Eyes: no drainage  ENT: no runny nose   Cardiovascular:  no chest pain  Resp: no SOB  GI: no vomiting GU: no dysuria Integumentary: no rash  Allergy: no hives  Musculoskeletal: Right leg swelling  Neurological: no slurred speech ROS otherwise negative  PAST MEDICAL HISTORY/PAST SURGICAL HISTORY:  Past Medical History  Diagnosis Date  . Endometriosis   . PCOS (polycystic ovarian syndrome)   . Interstitial cystitis   . GAD (generalized anxiety disorder)   . Chronic sinusitis   . Polycystic kidney disease   . DVT (deep venous thrombosis)     MEDICATIONS:  Prior to Admission medications   Medication Sig Start Date End Date Taking? Authorizing Provider  acetaminophen (TYLENOL) 500 MG tablet Take 500 mg by mouth every 6 (six) hours as needed for moderate pain.    Historical Provider, MD  amitriptyline (ELAVIL) 50 MG tablet Take 50 mg by mouth at bedtime.    Historical Provider, MD  calcium-vitamin D (OSCAL WITH D) 500-200 MG-UNIT per tablet Take 1 tablet by  mouth daily.      Historical Provider, MD  Cyanocobalamin (B-12) 50 MCG TABS Take 50 mcg by mouth daily.     Historical Provider, MD  HYDROcodone-acetaminophen (NORCO/VICODIN) 5-325 MG per tablet Take 2 tablets by mouth every 6 (six) hours as needed for moderate pain.    Historical Provider, MD  Iron-Vitamin C (FE C PO) Take 1 tablet by mouth daily.     Historical Provider, MD  Multiple Vitamin (MULTI-DAY VITAMINS PO) Take 1 tablet by mouth daily.      Historical Provider, MD  PARoxetine (PAXIL) 40 MG tablet  04/22/14   Historical Provider, MD  pentosan polysulfate (ELMIRON) 100 MG capsule Take 200 mg by mouth 2 (two) times daily.    Historical Provider, MD  rivaroxaban (XARELTO) 20 MG TABS tablet Take 1 tablet (20 mg total) by mouth daily with supper. 05/14/14   Camelia Eng Tysinger, PA-C  traMADol (ULTRAM) 50 MG tablet Take 1 tablet (50 mg total) by mouth every 6 (six) hours as needed for pain. 03/19/12   Donnamae Jude, MD  varenicline (CHANTIX PAK) 0.5 MG X 11 & 1 MG X 42 tablet Take 1 mg by mouth daily. Take one 0.5 mg tablet by mouth once daily for 3 days, then increase to one 0.5 mg tablet twice daily for 4 days, then increase to one 1 mg tablet twice daily.    Historical Provider, MD  Dollene Primrose PACK 15 & 20  MG TBPK Take 15-20 mg by mouth as directed. Take as directed on package: Start with one 15mg  tablet by mouth twice a day with food. On Day 22, switch to one 20mg  tablet once a day with food. 04/16/14   Orpah Greek, MD    ALLERGIES:  Allergies  Allergen Reactions  . Latex Itching and Rash    SOCIAL HISTORY:  History  Substance Use Topics  . Smoking status: Former Smoker -- 1.00 packs/day    Types: Cigarettes  . Smokeless tobacco: Not on file  . Alcohol Use: No    FAMILY HISTORY: Family History  Problem Relation Age of Onset  . Diabetes Mother   . Macular degeneration Mother   . Hyperlipidemia Mother   . Hypertension Mother   . Polycystic kidney disease Father    . Heart disease Father   . Diabetes Father   . Hepatitis Father     C  . Hypothyroidism Father   . Hypertension Father     EXAM:  Triage Vitals: BP 115/72 mmHg  Pulse 85  Temp(Src) 97.5 F (36.4 C) (Oral)  Resp 16  SpO2 100%  CONSTITUTIONAL: Alert and oriented and responds appropriately to questions. Well-appearing; well-nourished HEAD: Normocephalic EYES: Conjunctivae clear, PERRL ENT: normal nose; no rhinorrhea; moist mucous membranes; pharynx without lesions noted NECK: Supple, no meningismus, no LAD  CARD: RRR; S1 and S2 appreciated; no murmurs, no clicks, no rubs, no gallops RESP: Normal chest excursion without splinting or tachypnea; breath sounds clear and equal bilaterally; no wheezes, no rhonchi, no rales,  ABD/GI: Normal bowel sounds; non-distended; soft, non-tender, no rebound, no guarding BACK:  The back appears normal and is non-tender to palpation, there is no CVA tenderness EXT: Normal ROM in all joints; non-tender to palpation; no edema; normal capillary refill; no cyanosis  Blotchy redness without warmth or induration to the distal half of the RLE with normal sensation diffusely. 2+ DP pulses bilaterally. No joint effusion. No calf tenderness. Compartments are soft.   SKIN: Normal color for age and race; warm NEURO: Moves all extremities equally PSYCH: The patient's mood and manner are appropriate. Grooming and personal hygiene are appropriate.  MEDICAL DECISION MAKING: Patient here with right leg pain with some blotchy areas of erythema but no obvious sign of cellulitis, septic arthritis and no history of injury. Neurovascular intact distally. Concern for recurrent DVT. Unable to obtain venous Doppler at this time of night. Will order a venous Doppler for the morning and give dose of Lovenox in the ED. We'll discharge with prescription for pain medication as she drove herself to the hospital. Discussed strict return precautions. She verbalized understanding and is  comfortable with plan. Has outpatient follow-up. Patient denies chest pain or shortness of breath.  I personally performed the services described in this documentation, which was scribed in my presence. The recorded information has been reviewed and is accurate.    [place], DO 09/09/14 [phone]

## 2014-09-08 NOTE — ED Notes (Signed)
She had tumor removal surgery to RLE in October and had a DVT at that time. She was treated with xarelto then switched to ASA therapy. She is still on ASA daily. She woke today with redness, tightness, and pain to RLE. She is concerned for another DVT. CMS intact.

## 2014-09-09 ENCOUNTER — Ambulatory Visit (HOSPITAL_COMMUNITY)
Admission: RE | Admit: 2014-09-09 | Discharge: 2014-09-09 | Disposition: A | Discharge status: Home / Self Care | Payer: BLUE CROSS/BLUE SHIELD | Source: Ambulatory Visit | Attending: Emergency Medicine | Admitting: Emergency Medicine

## 2014-09-09 ENCOUNTER — Other Ambulatory Visit (HOSPITAL_COMMUNITY): Payer: Self-pay | Admitting: Unknown Physician Specialty

## 2014-09-09 DIAGNOSIS — M7989 Other specified soft tissue disorders: Secondary | ICD-10-CM | POA: Insufficient documentation

## 2014-09-09 DIAGNOSIS — M79604 Pain in right leg: Secondary | ICD-10-CM | POA: Insufficient documentation

## 2014-09-09 MED ORDER — ENOXAPARIN SODIUM 60 MG/0.6ML ~~LOC~~ SOLN
60.0000 mg | Freq: Once | SUBCUTANEOUS | Status: AC
  Administered 2014-09-09: 60 mg via SUBCUTANEOUS
  Filled 2014-09-09: qty 0.6

## 2014-09-09 MED ORDER — OXYCODONE-ACETAMINOPHEN 5-325 MG PO TABS: 1.0000 | ORAL_TABLET | ORAL | Status: DC | PRN

## 2014-09-09 NOTE — Discharge Instructions (Signed)
You were seen in the emergency department for pain in your right leg. We have ordered an ultrasound to rule out a DVT that will be completed tomorrow. You have been given a dose of blood thinners that will last you overnight. If your ultrasound is negative for DVT, I recommend you follow-up with your primary care provider for further evaluation. Your leg does have some areas of redness but no obvious sign of infection. If your leg becomes very red, swollen and you have fever, I recommend you return to the hospital.  Without a history of injury is very unlikely that you have a fracture and I do not think that x-rays would be helpful today.

## 2014-09-09 NOTE — ED Notes (Signed)
Pt verbalized understanding of d/c instructions and has no further questions.  

## 2014-09-09 NOTE — Progress Notes (Signed)
VASCULAR LAB PRELIMINARY  PRELIMINARY  PRELIMINARY  PRELIMINARY  Right lower extremity venous duplex completed.    Preliminary report:  Right:  No evidence of DVT, superficial thrombosis, or Baker's cyst.  Kinberly Perris, RVS 09/09/2014, 2:57 PM

## 2014-09-13 ENCOUNTER — Ambulatory Visit: Payer: Self-pay

## 2014-09-13 ENCOUNTER — Ambulatory Visit: Payer: Self-pay | Admitting: Hematology

## 2014-09-15 ENCOUNTER — Other Ambulatory Visit: Payer: Self-pay | Admitting: Urology

## 2014-09-17 ENCOUNTER — Ambulatory Visit: Payer: Self-pay

## 2014-09-17 ENCOUNTER — Ambulatory Visit: Payer: Self-pay | Admitting: Hematology

## 2014-10-19 DIAGNOSIS — Z0279 Encounter for issue of other medical certificate: Secondary | ICD-10-CM

## 2014-10-29 ENCOUNTER — Other Ambulatory Visit: Payer: Self-pay | Admitting: Obstetrics

## 2014-11-02 NOTE — Patient Instructions (Addendum)
   Your procedure is scheduled on:  Thursday, May 12  Enter through the Main Entrance of [facility] at: 6 AM Pick up the phone at the desk and dial [phone] and inform us of your arrival.  Please call this number if you have any problems the morning of surgery: [phone]  Remember: Do not eat or drink after midnight: Wednesday Take these medicines the morning of surgery with a SIP OF WATER: PAXIL and ELMIRON  Do not wear jewelry, make-up, or FINGER nail polish No metal in your hair or on your body. Do not wear lotions, powders, perfumes.  You may wear deodorant.  Do not bring valuables to the hospital. Contacts, dentures or bridgework may not be worn into surgery.  Leave suitcase in the car. After Surgery it may be brought to your room. For patients being admitted to the hospital, checkout time is 11:00am the day of discharge.  Home with mother Sharon Bowman or friend Sharon Bowman

## 2014-11-03 ENCOUNTER — Encounter: Payer: Self-pay | Admitting: Medical

## 2014-11-03 ENCOUNTER — Ambulatory Visit (INDEPENDENT_AMBULATORY_CARE_PROVIDER_SITE_OTHER): Payer: BLUE CROSS/BLUE SHIELD | Admitting: Medical

## 2014-11-03 VITALS — BP 100/80 | HR 77 | Temp 98.3°F | Resp 15 | Wt 135.0 lb

## 2014-11-03 DIAGNOSIS — R490 Dysphonia: Secondary | ICD-10-CM

## 2014-11-03 DIAGNOSIS — J069 Acute upper respiratory infection, unspecified: Secondary | ICD-10-CM

## 2014-11-03 DIAGNOSIS — M19041 Primary osteoarthritis, right hand: Secondary | ICD-10-CM

## 2014-11-03 LAB — CBC WITH DIFFERENTIAL/PLATELET
Basophils Absolute: 0 10*3/uL (ref 0.0–0.1)
Basophils Relative: 0 % (ref 0–1)
EOS ABS: 0.2 10*3/uL (ref 0.0–0.7)
EOS PCT: 2 % (ref 0–5)
HCT: 36.7 % (ref 36.0–46.0)
HEMOGLOBIN: 12.3 g/dL (ref 12.0–15.0)
LYMPHS ABS: 3.4 10*3/uL (ref 0.7–4.0)
LYMPHS PCT: 39 % (ref 12–46)
MCH: 31.4 pg (ref 26.0–34.0)
MCHC: 33.5 g/dL (ref 30.0–36.0)
MCV: 93.6 fL (ref 78.0–100.0)
MONO ABS: 0.6 10*3/uL (ref 0.1–1.0)
MPV: 10.7 fL (ref 8.6–12.4)
Monocytes Relative: 7 % (ref 3–12)
Neutro Abs: 4.5 10*3/uL (ref 1.7–7.7)
Neutrophils Relative %: 52 % (ref 43–77)
Platelets: 335 10*3/uL (ref 150–400)
RBC: 3.92 MIL/uL (ref 3.87–5.11)
RDW: 13.2 % (ref 11.5–15.5)
WBC: 8.7 10*3/uL (ref 4.0–10.5)

## 2014-11-03 NOTE — Anesthesia Preprocedure Evaluation (Addendum)
Anesthesia Evaluation  Patient identified by MRN, date of birth, ID band Patient awake    Reviewed: Allergy & Precautions, H&P , Patient's Chart, lab work & pertinent test results, reviewed documented beta blocker date and time   Airway Mallampati: II  TM Distance: >3 FB Neck ROM: full    Dental no notable dental hx.    Pulmonary former smoker,  breath sounds clear to auscultation  Pulmonary exam normal       Cardiovascular Rhythm:regular Rate:Normal     Neuro/Psych    GI/Hepatic   Endo/Other    Renal/GU      Musculoskeletal   Abdominal   Peds  Hematology   Anesthesia Other Findings Anxiety Smoker Hx of DVT; on anti-coagulant  Reproductive/Obstetrics                            Anesthesia Physical Anesthesia Plan  ASA: II  Anesthesia Plan: General   Post-op Pain Management:    Induction: Intravenous  Airway Management Planned: Oral ETT  Additional Equipment:   Intra-op Plan:   Post-operative Plan: Extubation in OR  Informed Consent: I have reviewed the patients History and Physical, chart, labs and discussed the procedure including the risks, benefits and alternatives for the proposed anesthesia with the patient or authorized representative who has indicated his/her understanding and acceptance.   Dental Advisory Given and Dental advisory given  Plan Discussed with: CRNA and Surgeon  Anesthesia Plan Comments: (  Discussed general anesthesia, including possible nausea, instrumentation of airway, sore throat,pulmonary aspiration, etc. I asked if the were any outstanding questions, or  concerns before we proceeded. )        Anesthesia Quick Evaluation

## 2014-11-03 NOTE — Progress Notes (Addendum)
Subjective:  Sharon Bowman is a 39 y.o. female who presents for evaluation of sore throat.  Started 1 week ago with sore throat, hoarseness.  Some nasal drainage and mild sinus pressure, cough, some production.   No ear pain, fever, NVD, SOB, wheezing.  Has hysterectomy next week.   Quit smoking 07/2014.   She has not had a recent close exposure to someone with proven streptococcal pharyngitis.  Treatment to date: airborne tablets, hot tea.  +sick contacts, works in [employer] research (medical).  No other aggravating or relieving factors.   Needs labs done per [facility], has script order, having arthritis of hand and they want to rule rheumatologic cause.  No other c/o.  The following portions of the patient's history were reviewed and updated as appropriate: allergies, current medications, past medical history, past social history, past surgical history and problem list.  ROS as in subjective   Objective: Filed Vitals:   11/03/14 0908  BP: 100/80  Pulse: 77  Temp: 98.3 F (36.8 C)  Resp: 15   General appearance: no distress, WD/WN, ill-appearing HEENT: normocephalic, conjunctiva/corneas normal, sclerae anicteric, nares patent, no discharge or erythema, pharynx with no erythema, no exudate.  Oral cavity: MMM, no lesions  Neck: supple, no lymphadenopathy, no thyromegaly Lungs: CTA bilaterally, no wheezes, rhonchi, or rales    Assessment and Plan: Encounter Diagnoses  Name Primary?  . Viral upper respiratory illness Yes  . Hoarseness   . Osteoarthritis, hand, primary localized, right    Advised that symptoms and exam suggest a viral etiology.  Discussed symptomatic treatment including salt water gargles, warm fluids, rest, hydrate well, can use over-the-counter Tylenol for throat pain, fever, or malaise. If worse or not improving within 2-3 days, call or return.  OA hand - rheum screening labs today per Dr. Maia Breslow, [facility]

## 2014-11-03 NOTE — Addendum Note (Signed)
Addended by: Carlena Hurl on: 11/03/2014 09:43 AM   Modules accepted: Orders, Level of Service

## 2014-11-04 ENCOUNTER — Encounter (HOSPITAL_COMMUNITY)
Admission: RE | Admit: 2014-11-04 | Discharge: 2014-11-04 | Disposition: A | Discharge status: Home / Self Care | Payer: BLUE CROSS/BLUE SHIELD | Source: Ambulatory Visit | Attending: Obstetrics | Admitting: Obstetrics

## 2014-11-04 ENCOUNTER — Encounter (HOSPITAL_COMMUNITY): Payer: Self-pay

## 2014-11-04 DIAGNOSIS — Q613 Polycystic kidney, unspecified: Secondary | ICD-10-CM | POA: Diagnosis not present

## 2014-11-04 DIAGNOSIS — F419 Anxiety disorder, unspecified: Secondary | ICD-10-CM | POA: Diagnosis not present

## 2014-11-04 DIAGNOSIS — Z7901 Long term (current) use of anticoagulants: Secondary | ICD-10-CM | POA: Diagnosis not present

## 2014-11-04 DIAGNOSIS — R102 Pelvic and perineal pain: Secondary | ICD-10-CM | POA: Diagnosis not present

## 2014-11-04 DIAGNOSIS — F329 Major depressive disorder, single episode, unspecified: Secondary | ICD-10-CM | POA: Diagnosis not present

## 2014-11-04 DIAGNOSIS — E282 Polycystic ovarian syndrome: Secondary | ICD-10-CM | POA: Diagnosis not present

## 2014-11-04 DIAGNOSIS — N3010 Interstitial cystitis (chronic) without hematuria: Secondary | ICD-10-CM | POA: Diagnosis present

## 2014-11-04 DIAGNOSIS — Z01818 Encounter for other preprocedural examination: Secondary | ICD-10-CM | POA: Insufficient documentation

## 2014-11-04 DIAGNOSIS — Z87442 Personal history of urinary calculi: Secondary | ICD-10-CM | POA: Diagnosis not present

## 2014-11-04 DIAGNOSIS — F17210 Nicotine dependence, cigarettes, uncomplicated: Secondary | ICD-10-CM | POA: Diagnosis not present

## 2014-11-04 DIAGNOSIS — Z30432 Encounter for removal of intrauterine contraceptive device: Secondary | ICD-10-CM | POA: Diagnosis not present

## 2014-11-04 DIAGNOSIS — Z79899 Other long term (current) drug therapy: Secondary | ICD-10-CM | POA: Diagnosis not present

## 2014-11-04 DIAGNOSIS — N803 Endometriosis of pelvic peritoneum: Secondary | ICD-10-CM | POA: Diagnosis not present

## 2014-11-04 DIAGNOSIS — M1990 Unspecified osteoarthritis, unspecified site: Secondary | ICD-10-CM | POA: Diagnosis not present

## 2014-11-04 DIAGNOSIS — I825Z1 Chronic embolism and thrombosis of unspecified deep veins of right distal lower extremity: Secondary | ICD-10-CM | POA: Diagnosis not present

## 2014-11-04 HISTORY — DX: Headache: R51

## 2014-11-04 HISTORY — DX: Anemia, unspecified: D64.9

## 2014-11-04 HISTORY — DX: Unspecified osteoarthritis, unspecified site: M19.90

## 2014-11-04 HISTORY — DX: Anxiety disorder, unspecified: F41.9

## 2014-11-04 HISTORY — DX: Depression, unspecified: F32.A

## 2014-11-04 HISTORY — DX: Headache, unspecified: R51.9

## 2014-11-04 HISTORY — DX: Major depressive disorder, single episode, unspecified: F32.9

## 2014-11-04 LAB — TYPE AND SCREEN
ABO/RH(D): A NEG
ANTIBODY SCREEN: NEGATIVE

## 2014-11-04 LAB — BASIC METABOLIC PANEL
ANION GAP: 8 (ref 5–15)
BUN: 13 mg/dL (ref 6–20)
CHLORIDE: 97 mmol/L — AB (ref 101–111)
CO2: 30 mmol/L (ref 22–32)
Calcium: 9.3 mg/dL (ref 8.9–10.3)
Creatinine, Ser: 0.74 mg/dL (ref 0.44–1.00)
GFR calc Af Amer: >60 mL/min (ref >60)
Glucose, Bld: 84 mg/dL (ref 70–99)
POTASSIUM: 3.9 mmol/L (ref 3.5–5.1)
Sodium: 135 mmol/L (ref 135–145)

## 2014-11-04 LAB — CBC
HCT: 38.4 % (ref 36.0–46.0)
HEMOGLOBIN: 13 g/dL (ref 12.0–15.0)
MCH: 31.7 pg (ref 26.0–34.0)
MCHC: 33.9 g/dL (ref 30.0–36.0)
MCV: 93.7 fL (ref 78.0–100.0)
Platelets: 327 10*3/uL (ref 150–400)
RBC: 4.1 MIL/uL (ref 3.87–5.11)
RDW: 12.9 % (ref 11.5–15.5)
WBC: 10.5 10*3/uL (ref 4.0–10.5)

## 2014-11-04 LAB — HIGH SENSITIVITY CRP: CRP, High Sensitivity: 2.6 mg/L

## 2014-11-04 LAB — ABO/RH: ABO/RH(D): A NEG

## 2014-11-04 LAB — SEDIMENTATION RATE: Sed Rate: 1 mm/hr (ref 0–20)

## 2014-11-04 LAB — RHEUMATOID FACTOR: Rhuematoid fact SerPl-aCnc: <10 IU/mL (ref <=14)

## 2014-11-04 LAB — ANA: Anti Nuclear Antibody(ANA): NEGATIVE

## 2014-11-10 MED ORDER — CEFAZOLIN SODIUM-DEXTROSE 2-3 GM-% IV SOLR
2.0000 g | INTRAVENOUS | Status: AC
  Administered 2014-11-11: 2 g via INTRAVENOUS

## 2014-11-10 NOTE — Consult Note (Signed)
History of Present Illness Pt is referred by Dr. Valentino Saxon for cysto, and HOD and bilateral stents prior to [facility]. She is a former pt of Dr. Matilde Sprang, and Dr. Amalia Hailey, treated for IC. Renal u/s 2015 showed bilateral renal cysts, unchanged ( c/w APCKD)   Note: DVT, Oct. 2015, following R knee surgery, Rx Xaralto            Endometriosis            Polycystic Ovary Disease            Active smoking abuse            Adult Polycystic Kidney Disease ( Dr. Marval Regal)               Cr. 0.74 ( May, 2016)  Sharon Bowman is diagnosed with interstitial cystitis by Dr. Amalia Hailey. She currently presents with back pain. She does have endometriosis. If bladder instillations help her back pain, this would help sort out whether or not it is definitely due to her interstitial cystitis.   She has frequency which is stable. She thinks the Marcaine 0.5%, 25 cc instillation done last time helped some. I dictated that I gave her a prescription, but they have not been done at home.  She is still on Elavil 50 mg. She cannot take Mobic. She is on Elmiron.  In the past she was taking a lot more Vicodin.  Review of systems: No change in bowel or neurologic status.      Past Medical History Problems  1. History of  Anxiety 300.00 2. History of  Depression 311 3. History of  Endometriosis 617.9 4. History of  Nephrolithiasis V13.01 5. History of  Polycystic Kidney Bilaterally 753.12 6. History of  Polycystic Ovarian Syndrome 256.4  Surgical History Problems  1. History of  Cystoscopy With Dilation Of Bladder 2. History of  Cystoscopy With Dilation Of Bladder 3. 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 Amitriptyline HCl 50 MG Oral Tablet; TAKE 1 TABLET qHS; Status: ACTIVE 2. Birth Control Pill; Status: ACTIVE 3. Calcium TABS; Status: ACTIVE 4. Elmiron 100 MG Oral Capsule; TAKE 2 CAPSULE BID; Status: ACTIVE 5. Hydrocodone-Acetaminophen 10-325 MG Oral Tablet; 1-2  tabs po q 6  hours prn; Status: ACTIVE 6. Iron TABS; Status: ACTIVE 7. KlonoPIN TABS; Status: ACTIVE 8. Multiple Vitamin TABS; Status: ACTIVE 9. Phenazopyridine-Butabarb-Hyosc 150-15-0.3 MG Oral Tablet; Take 1 tablet  3 times a  day as needed; Status: ACTIVE 10. TraMADol HCl TABS; Status: ACTIVE 11. Zoloft TABS; Status: ACTIVE  Allergies Medication  1. No Known Drug Allergies  Family History Problems  1. Family history of  Family Health Status - Father's Age 75; Category: Family history of; Status: Active 2. Family history of  Family Health Status - Mother's Age 46; Category: Family history of; Status: Active 3. Paternal history of  Renal Failure Category: Paternal history of; Status: Active  Social History Problems  1. Tobacco Use V15.82 1 ppd x 12-15 yrs; Status: Active Denied  2. History of  Alcohol Use Category: History of; Status: Denied 3. History of  Caffeine Use Category: History of; Status: Denied   Assessment Assessed  1. Backache 724.5 2. Chronic Interstitial Cystitis 595.1 3. Urinary Frequency 788.41  Plan For cysto, HOD, Bilateral retrograde pyelogram, and ureteral catheters in AM prior to LAVH per Dr. Rosario Adie.    Signatures

## 2014-11-10 NOTE — H&P (Signed)
See scanned H&P in media tab or paper chart for full details.  In short, 39 yo G1P0 with long h/o chronic pelvic pain, IC and known endometriosis, s/p l'scope x 2 who is ready for definitive management with hysterectomy/ BSO for failure of conservative measures. Planning ureteral stenting for prior surgical finding of endometriosis around ureter and hydrodistension for IC. Pt with DVT fall 2015 after knee surgery and planning peri-operative thromboprophylaxis with Lovenox. No coagulopathy workup was recommended by the consulting hematologist.   Latex allergy  Meds: ASA, hydroxyzine, amitriptyline, Paxil  PMH: PCKD, prior DVT, IC, chronic pelvic pain. Past Medical History  Diagnosis Date  . Endometriosis   . PCOS (polycystic ovarian syndrome)   . Interstitial cystitis   . GAD (generalized anxiety disorder)   . Chronic sinusitis   . Polycystic kidney disease   . DVT (deep venous thrombosis) 04/2014    right leg - behind calf, no treated with aspirin daily  . Anxiety   . Depression   . Headache   . Arthritis     osetoarthritis  . Anemia     Past Surgical History  Procedure Laterality Date  . Polycystic kindey disease      [place] [facility])  . Laparoscopy      X 2 ENDOMETRIOSIS.  Marland Kitchen Bladder surgery      BLADDER STRETCH X 3  . Leg surgery  04/2014    right knee - tumor - [facility]  . Carpel radial tunnel  2014  . Carpel tunnel      left  and right hand   . Tooth extraction      PE;  Filed Vitals:   11/11/14 0611  BP: 108/62  Pulse: 80  Temp: 98.2 F (36.8 C)  TempSrc: Oral  Resp: 18  SpO2: 100%   CV: RRR Pulm: CTAB Abd: soft, NT, ND GU: def to OR LE: NT, no edema Gen: well appearing, no distress  CBC    Component Value Date/Time   WBC 10.5 11/04/2014 1552   RBC 4.10 11/04/2014 1552   HGB 13.0 11/04/2014 1552   HCT 38.4 11/04/2014 1552   PLT 327 11/04/2014 1552   MCV 93.7 11/04/2014 1552   MCH 31.7 11/04/2014 1552   MCHC 33.9 11/04/2014 1552   RDW 12.9 11/04/2014 1552   LYMPHSABS 3.4 11/03/2014 0001   MONOABS 0.6 11/03/2014 0001   EOSABS 0.2 11/03/2014 0001   BASOSABS 0.0 11/03/2014 0001     CMP     Component Value Date/Time   NA 135 11/04/2014 1552   K 3.9 11/04/2014 1552   CL 97* 11/04/2014 1552   CO2 30 11/04/2014 1552   GLUCOSE 84 11/04/2014 1552   BUN 13 11/04/2014 1552   CREATININE 0.74 11/04/2014 1552   CREATININE 0.68 03/04/2013 1208   CALCIUM 9.3 11/04/2014 1552   PROT 6.3 04/05/2009 0430   ALBUMIN 3.4* 04/05/2009 0430   AST 18 04/05/2009 0430   ALT 17 04/05/2009 0430   ALKPHOS 45 04/05/2009 0430   BILITOT 0.4 04/05/2009 0430   GFRNONAA >60 11/04/2014 1552   GFRAA >60 11/04/2014 1552      A/P: robotic assisted TLH/ BSO for endometriosis and chronic pelvic pain. R/B reviewed multiple times in past, including immediate surgical risk (bladder, ureter, bowel injury, anasthesia risks, need for open surgery, VTE, infection, bleeding) and longer term risks (regret of infertility, long term need for HRT, higher all cause mortality due to young BSO).  Toi Stelly,Pippa A. 11/11/2014 7:16 AM

## 2014-11-11 ENCOUNTER — Encounter (HOSPITAL_COMMUNITY)
Admission: RE | Disposition: A | Discharge status: Home / Self Care | Payer: Self-pay | Source: Ambulatory Visit | Attending: Obstetrics

## 2014-11-11 ENCOUNTER — Ambulatory Visit (HOSPITAL_COMMUNITY): Payer: BLUE CROSS/BLUE SHIELD | Admitting: Anesthesiology

## 2014-11-11 ENCOUNTER — Ambulatory Visit (HOSPITAL_COMMUNITY): Payer: BLUE CROSS/BLUE SHIELD

## 2014-11-11 ENCOUNTER — Ambulatory Visit (HOSPITAL_COMMUNITY)
Admission: RE | Admit: 2014-11-11 | Discharge: 2014-11-12 | Disposition: A | Discharge status: Home / Self Care | Payer: BLUE CROSS/BLUE SHIELD | Source: Ambulatory Visit | Attending: Obstetrics | Admitting: Obstetrics

## 2014-11-11 ENCOUNTER — Encounter (HOSPITAL_COMMUNITY): Payer: Self-pay | Admitting: *Deleted

## 2014-11-11 DIAGNOSIS — Z30432 Encounter for removal of intrauterine contraceptive device: Secondary | ICD-10-CM | POA: Insufficient documentation

## 2014-11-11 DIAGNOSIS — E282 Polycystic ovarian syndrome: Secondary | ICD-10-CM | POA: Insufficient documentation

## 2014-11-11 DIAGNOSIS — N3010 Interstitial cystitis (chronic) without hematuria: Secondary | ICD-10-CM | POA: Insufficient documentation

## 2014-11-11 DIAGNOSIS — Z09 Encounter for follow-up examination after completed treatment for conditions other than malignant neoplasm: Secondary | ICD-10-CM

## 2014-11-11 DIAGNOSIS — F17210 Nicotine dependence, cigarettes, uncomplicated: Secondary | ICD-10-CM | POA: Insufficient documentation

## 2014-11-11 DIAGNOSIS — F419 Anxiety disorder, unspecified: Secondary | ICD-10-CM | POA: Insufficient documentation

## 2014-11-11 DIAGNOSIS — M1990 Unspecified osteoarthritis, unspecified site: Secondary | ICD-10-CM | POA: Insufficient documentation

## 2014-11-11 DIAGNOSIS — Q613 Polycystic kidney, unspecified: Secondary | ICD-10-CM | POA: Insufficient documentation

## 2014-11-11 DIAGNOSIS — Z87442 Personal history of urinary calculi: Secondary | ICD-10-CM | POA: Insufficient documentation

## 2014-11-11 DIAGNOSIS — N809 Endometriosis, unspecified: Secondary | ICD-10-CM | POA: Diagnosis present

## 2014-11-11 DIAGNOSIS — F329 Major depressive disorder, single episode, unspecified: Secondary | ICD-10-CM | POA: Insufficient documentation

## 2014-11-11 DIAGNOSIS — Z7901 Long term (current) use of anticoagulants: Secondary | ICD-10-CM | POA: Insufficient documentation

## 2014-11-11 DIAGNOSIS — I825Z1 Chronic embolism and thrombosis of unspecified deep veins of right distal lower extremity: Secondary | ICD-10-CM | POA: Insufficient documentation

## 2014-11-11 DIAGNOSIS — R102 Pelvic and perineal pain: Secondary | ICD-10-CM | POA: Insufficient documentation

## 2014-11-11 DIAGNOSIS — N803 Endometriosis of pelvic peritoneum: Secondary | ICD-10-CM | POA: Insufficient documentation

## 2014-11-11 DIAGNOSIS — Z79899 Other long term (current) drug therapy: Secondary | ICD-10-CM | POA: Insufficient documentation

## 2014-11-11 HISTORY — PX: ROBOTIC ASSISTED LAPAROSCOPIC LYSIS OF ADHESION: SHX6080

## 2014-11-11 HISTORY — PX: IUD REMOVAL: SHX5392

## 2014-11-11 HISTORY — PX: CYSTO WITH HYDRODISTENSION: SHX5453

## 2014-11-11 HISTORY — PX: CYSTOSCOPY: SHX5120

## 2014-11-11 HISTORY — PX: ROBOTIC ASSISTED TOTAL HYSTERECTOMY WITH BILATERAL SALPINGO OOPHERECTOMY: SHX6086

## 2014-11-11 HISTORY — PX: CYSTOSCOPY WITH RETROGRADE PYELOGRAM, URETEROSCOPY AND STENT PLACEMENT: SHX5789

## 2014-11-11 LAB — PREGNANCY, URINE: PREG TEST UR: NEGATIVE

## 2014-11-11 LAB — TYPE AND SCREEN
ABO/RH(D): A NEG
Antibody Screen: NEGATIVE

## 2014-11-11 SURGERY — ROBOTIC ASSISTED TOTAL HYSTERECTOMY WITH BILATERAL SALPINGO OOPHORECTOMY
Anesthesia: General | Site: Uterus

## 2014-11-11 MED ORDER — MIDAZOLAM HCL 2 MG/2ML IJ SOLN
INTRAMUSCULAR | Status: DC | PRN
  Administered 2014-11-11 (×2): 2 mg via INTRAVENOUS

## 2014-11-11 MED ORDER — CEFAZOLIN SODIUM-DEXTROSE 2-3 GM-% IV SOLR
INTRAVENOUS | Status: AC
  Filled 2014-11-11: qty 50

## 2014-11-11 MED ORDER — ONDANSETRON HCL 4 MG PO TABS
4.0000 mg | ORAL_TABLET | Freq: Four times a day (QID) | ORAL | Status: DC | PRN
  Administered 2014-11-11: 4 mg via ORAL
  Filled 2014-11-11: qty 1

## 2014-11-11 MED ORDER — TRIAMCINOLONE ACETONIDE 40 MG/ML IJ SUSP
40.0000 mg | Freq: Once | INTRAMUSCULAR | Status: DC
  Filled 2014-11-11: qty 1

## 2014-11-11 MED ORDER — PHENAZOPYRIDINE HCL 200 MG PO TABS
Freq: Once | ORAL | Status: AC
  Administered 2014-11-11: 15 mL via INTRAVESICAL
  Filled 2014-11-11: qty 15

## 2014-11-11 MED ORDER — HYDROMORPHONE HCL 1 MG/ML IJ SOLN
INTRAMUSCULAR | Status: AC
  Filled 2014-11-11: qty 1

## 2014-11-11 MED ORDER — NEOSTIGMINE METHYLSULFATE 10 MG/10ML IV SOLN
INTRAVENOUS | Status: AC
  Filled 2014-11-11: qty 1

## 2014-11-11 MED ORDER — ONDANSETRON HCL 4 MG/2ML IJ SOLN
INTRAMUSCULAR | Status: AC
  Filled 2014-11-11: qty 2

## 2014-11-11 MED ORDER — ACETAMINOPHEN 160 MG/5ML PO SOLN
975.0000 mg | Freq: Once | ORAL | Status: AC
  Administered 2014-11-11: 975 mg via ORAL

## 2014-11-11 MED ORDER — SIMETHICONE 80 MG PO CHEW: 80.0000 mg | CHEWABLE_TABLET | Freq: Four times a day (QID) | ORAL | Status: DC | PRN

## 2014-11-11 MED ORDER — IOHEXOL 300 MG/ML  SOLN
50.0000 mL | Freq: Once | INTRAMUSCULAR | Status: AC | PRN
  Administered 2014-11-11: 50 mL via URETHRAL

## 2014-11-11 MED ORDER — FENTANYL CITRATE (PF) 250 MCG/5ML IJ SOLN
INTRAMUSCULAR | Status: AC
  Filled 2014-11-11: qty 5

## 2014-11-11 MED ORDER — FENTANYL CITRATE (PF) 100 MCG/2ML IJ SOLN
INTRAMUSCULAR | Status: DC | PRN
  Administered 2014-11-11: 100 ug via INTRAVENOUS
  Administered 2014-11-11 (×3): 50 ug via INTRAVENOUS
  Administered 2014-11-11: 100 ug via INTRAVENOUS
  Administered 2014-11-11: 50 ug via INTRAVENOUS
  Administered 2014-11-11: 100 ug via INTRAVENOUS

## 2014-11-11 MED ORDER — KETOROLAC TROMETHAMINE 30 MG/ML IJ SOLN
30.0000 mg | Freq: Four times a day (QID) | INTRAMUSCULAR | Status: DC
  Administered 2014-11-11 – 2014-11-12 (×4): 30 mg via INTRAVENOUS
  Filled 2014-11-11 (×4): qty 1

## 2014-11-11 MED ORDER — PENTOSAN POLYSULFATE SODIUM 100 MG PO CAPS
200.0000 mg | ORAL_CAPSULE | Freq: Two times a day (BID) | ORAL | Status: DC
  Administered 2014-11-11 – 2014-11-12 (×2): 200 mg via ORAL
  Filled 2014-11-11 (×4): qty 2

## 2014-11-11 MED ORDER — CEFAZOLIN SODIUM-DEXTROSE 2-3 GM-% IV SOLR
INTRAVENOUS | Status: AC
  Administered 2014-11-11: 2 g via INTRAVENOUS
  Filled 2014-11-11: qty 50

## 2014-11-11 MED ORDER — LACTATED RINGERS IR SOLN
Status: DC | PRN
  Administered 2014-11-11: 6000 mL

## 2014-11-11 MED ORDER — MINERAL OIL LIGHT 100 % EX OIL
1.0000 "application " | TOPICAL_OIL | Freq: Once | CUTANEOUS | Status: DC
  Filled 2014-11-11: qty 25

## 2014-11-11 MED ORDER — ENOXAPARIN SODIUM 40 MG/0.4ML ~~LOC~~ SOLN
40.0000 mg | SUBCUTANEOUS | Status: DC
  Administered 2014-11-12: 40 mg via SUBCUTANEOUS
  Filled 2014-11-11 (×2): qty 0.4

## 2014-11-11 MED ORDER — MENTHOL 3 MG MT LOZG: 1.0000 | LOZENGE | OROMUCOSAL | Status: DC | PRN

## 2014-11-11 MED ORDER — OXYCODONE-ACETAMINOPHEN 5-325 MG PO TABS
1.0000 | ORAL_TABLET | ORAL | Status: DC | PRN
  Administered 2014-11-11: 1 via ORAL
  Administered 2014-11-12: 2 via ORAL
  Administered 2014-11-12: 1 via ORAL
  Administered 2014-11-12 (×2): 2 via ORAL
  Filled 2014-11-11 (×2): qty 2
  Filled 2014-11-11 (×2): qty 1
  Filled 2014-11-11: qty 2

## 2014-11-11 MED ORDER — HYDROMORPHONE HCL 1 MG/ML IJ SOLN
INTRAMUSCULAR | Status: AC
  Administered 2014-11-11: 0.5 mg via INTRAVENOUS
  Filled 2014-11-11: qty 1

## 2014-11-11 MED ORDER — MIDAZOLAM HCL 2 MG/2ML IJ SOLN
INTRAMUSCULAR | Status: AC
  Filled 2014-11-11: qty 2

## 2014-11-11 MED ORDER — ENOXAPARIN SODIUM 40 MG/0.4ML ~~LOC~~ SOLN
40.0000 mg | Freq: Once | SUBCUTANEOUS | Status: AC
  Administered 2014-11-11: 40 mg via SUBCUTANEOUS
  Filled 2014-11-11: qty 0.4

## 2014-11-11 MED ORDER — TRIAMCINOLONE ACETONIDE 40 MG/ML IJ SUSP
INTRAMUSCULAR | Status: DC | PRN
  Administered 2014-11-11: 10 mL via INTRAMUSCULAR

## 2014-11-11 MED ORDER — AMITRIPTYLINE HCL 50 MG PO TABS
50.0000 mg | ORAL_TABLET | Freq: Every day | ORAL | Status: DC
  Administered 2014-11-11: 50 mg via ORAL
  Filled 2014-11-11 (×2): qty 1

## 2014-11-11 MED ORDER — ROCURONIUM BROMIDE 100 MG/10ML IV SOLN
INTRAVENOUS | Status: AC
  Filled 2014-11-11: qty 1

## 2014-11-11 MED ORDER — KETOROLAC TROMETHAMINE 30 MG/ML IJ SOLN
INTRAMUSCULAR | Status: AC
  Filled 2014-11-11: qty 1

## 2014-11-11 MED ORDER — ACETAMINOPHEN 160 MG/5ML PO SOLN: 975.0000 mg | Freq: Once | ORAL | Status: DC

## 2014-11-11 MED ORDER — BELLADONNA ALKALOIDS-OPIUM 16.2-60 MG RE SUPP
1.0000 | Freq: Once | RECTAL | Status: DC
  Filled 2014-11-11: qty 1

## 2014-11-11 MED ORDER — LIDOCAINE HCL (CARDIAC) 20 MG/ML IV SOLN
INTRAVENOUS | Status: AC
  Filled 2014-11-11: qty 5

## 2014-11-11 MED ORDER — HYDROMORPHONE HCL 1 MG/ML IJ SOLN
0.2500 mg | INTRAMUSCULAR | Status: DC | PRN
  Administered 2014-11-11 (×3): 0.5 mg via INTRAVENOUS

## 2014-11-11 MED ORDER — HYDROMORPHONE HCL 1 MG/ML IJ SOLN
1.0000 mg | INTRAMUSCULAR | Status: DC | PRN
  Administered 2014-11-11 (×2): 1 mg via INTRAVENOUS
  Filled 2014-11-11 (×2): qty 1

## 2014-11-11 MED ORDER — DIPHENHYDRAMINE HCL 50 MG/ML IJ SOLN
12.5000 mg | Freq: Four times a day (QID) | INTRAMUSCULAR | Status: DC | PRN
  Administered 2014-11-11 – 2014-11-12 (×2): 12.5 mg via INTRAVENOUS
  Filled 2014-11-11 (×2): qty 1

## 2014-11-11 MED ORDER — PROPOFOL 10 MG/ML IV BOLUS
INTRAVENOUS | Status: DC | PRN
  Administered 2014-11-11: 50 mg via INTRAVENOUS
  Administered 2014-11-11: 150 mg via INTRAVENOUS

## 2014-11-11 MED ORDER — IBUPROFEN 600 MG PO TABS: 600.0000 mg | ORAL_TABLET | Freq: Four times a day (QID) | ORAL | Status: DC | PRN

## 2014-11-11 MED ORDER — DEXAMETHASONE SODIUM PHOSPHATE 4 MG/ML IJ SOLN
INTRAMUSCULAR | Status: AC
  Filled 2014-11-11: qty 1

## 2014-11-11 MED ORDER — ACETAMINOPHEN 160 MG/5ML PO SOLN
ORAL | Status: AC
  Filled 2014-11-11: qty 40.6

## 2014-11-11 MED ORDER — GLYCOPYRROLATE 0.2 MG/ML IJ SOLN
INTRAMUSCULAR | Status: DC | PRN
  Administered 2014-11-11: 0.6 mg via INTRAVENOUS

## 2014-11-11 MED ORDER — BUPIVACAINE HCL (PF) 0.25 % IJ SOLN
INTRAMUSCULAR | Status: DC | PRN
  Administered 2014-11-11: 18 mL

## 2014-11-11 MED ORDER — STERILE WATER FOR IRRIGATION IR SOLN
Status: DC | PRN
  Administered 2014-11-11: 6000 mL

## 2014-11-11 MED ORDER — LACTATED RINGERS IV SOLN
INTRAVENOUS | Status: DC
  Administered 2014-11-11 (×4): via INTRAVENOUS

## 2014-11-11 MED ORDER — SCOPOLAMINE 1 MG/3DAYS TD PT72
1.0000 | MEDICATED_PATCH | Freq: Once | TRANSDERMAL | Status: DC
Start: 2014-11-11 — End: 2014-11-11
  Administered 2014-11-11: 1.5 mg via TRANSDERMAL

## 2014-11-11 MED ORDER — LIDOCAINE HCL 2 % EX GEL
1.0000 "application " | Freq: Once | CUTANEOUS | Status: DC
  Filled 2014-11-11: qty 5

## 2014-11-11 MED ORDER — BUPIVACAINE HCL (PF) 0.5 % IJ SOLN
INTRAMUSCULAR | Status: AC
  Filled 2014-11-11: qty 30

## 2014-11-11 MED ORDER — SODIUM CHLORIDE 0.9 % IV SOLN
INTRAVENOUS | Status: DC
  Administered 2014-11-11 – 2014-11-12 (×3): via INTRAVENOUS

## 2014-11-11 MED ORDER — FENTANYL CITRATE (PF) 100 MCG/2ML IJ SOLN
INTRAMUSCULAR | Status: AC
  Filled 2014-11-11: qty 2

## 2014-11-11 MED ORDER — HYDROMORPHONE HCL 1 MG/ML IJ SOLN
INTRAMUSCULAR | Status: DC | PRN
  Administered 2014-11-11 (×2): 0.5 mg via INTRAVENOUS

## 2014-11-11 MED ORDER — KETOROLAC TROMETHAMINE 30 MG/ML IJ SOLN
INTRAMUSCULAR | Status: DC | PRN
  Administered 2014-11-11: 30 mg via INTRAVENOUS

## 2014-11-11 MED ORDER — ONDANSETRON HCL 4 MG/2ML IJ SOLN: 4.0000 mg | Freq: Four times a day (QID) | INTRAMUSCULAR | Status: DC | PRN

## 2014-11-11 MED ORDER — SCOPOLAMINE 1 MG/3DAYS TD PT72
MEDICATED_PATCH | TRANSDERMAL | Status: AC
  Filled 2014-11-11: qty 1

## 2014-11-11 MED ORDER — OXYCODONE-ACETAMINOPHEN 5-325 MG PO TABS
2.0000 | ORAL_TABLET | ORAL | Status: DC | PRN
Start: 2014-11-11 — End: 2014-11-11

## 2014-11-11 MED ORDER — GLYCOPYRROLATE 0.2 MG/ML IJ SOLN
INTRAMUSCULAR | Status: AC
  Filled 2014-11-11: qty 3

## 2014-11-11 MED ORDER — IOHEXOL 300 MG/ML  SOLN
INTRAMUSCULAR | Status: DC | PRN
  Administered 2014-11-11: 15 mg

## 2014-11-11 MED ORDER — CYCLOBENZAPRINE HCL 5 MG PO TABS
5.0000 mg | ORAL_TABLET | Freq: Three times a day (TID) | ORAL | Status: DC | PRN
  Administered 2014-11-12: 5 mg via ORAL
  Filled 2014-11-11 (×2): qty 1

## 2014-11-11 MED ORDER — DEXAMETHASONE SODIUM PHOSPHATE 10 MG/ML IJ SOLN
INTRAMUSCULAR | Status: DC | PRN
  Administered 2014-11-11: 4 mg via INTRAVENOUS

## 2014-11-11 MED ORDER — BUPIVACAINE HCL (PF) 0.25 % IJ SOLN
INTRAMUSCULAR | Status: AC
  Filled 2014-11-11: qty 30

## 2014-11-11 MED ORDER — NEOSTIGMINE METHYLSULFATE 10 MG/10ML IV SOLN
INTRAVENOUS | Status: DC | PRN
Start: 2014-11-11 — End: 2014-11-11
  Administered 2014-11-11: 4 mg via INTRAVENOUS

## 2014-11-11 MED ORDER — ONDANSETRON HCL 4 MG/2ML IJ SOLN
INTRAMUSCULAR | Status: DC | PRN
  Administered 2014-11-11: 4 mg via INTRAVENOUS

## 2014-11-11 MED ORDER — PROPOFOL 10 MG/ML IV BOLUS
INTRAVENOUS | Status: AC
  Filled 2014-11-11: qty 20

## 2014-11-11 MED ORDER — PANTOPRAZOLE SODIUM 40 MG PO TBEC
40.0000 mg | DELAYED_RELEASE_TABLET | Freq: Every day | ORAL | Status: DC
  Administered 2014-11-12: 40 mg via ORAL
  Filled 2014-11-11: qty 1

## 2014-11-11 MED ORDER — KETOROLAC TROMETHAMINE 30 MG/ML IJ SOLN: 30.0000 mg | Freq: Four times a day (QID) | INTRAMUSCULAR | Status: DC

## 2014-11-11 MED ORDER — ROCURONIUM BROMIDE 100 MG/10ML IV SOLN
INTRAVENOUS | Status: DC | PRN
  Administered 2014-11-11: 40 mg via INTRAVENOUS
  Administered 2014-11-11 (×2): 20 mg via INTRAVENOUS

## 2014-11-11 MED ORDER — LIDOCAINE HCL (CARDIAC) 20 MG/ML IV SOLN
INTRAVENOUS | Status: DC | PRN
  Administered 2014-11-11: 60 mg via INTRAVENOUS

## 2014-11-11 SURGICAL SUPPLY — 75 items
BARRIER ADHS 3X4 INTERCEED (GAUZE/BANDAGES/DRESSINGS) ×2 IMPLANT
BOOTIES KNEE HIGH SLOAN (MISCELLANEOUS) ×7 IMPLANT
BRR ADH 4X3 ABS CNTRL BYND (GAUZE/BANDAGES/DRESSINGS) ×5
CATH FOLEY 3WAY  5CC 16FR (CATHETERS) ×2
CATH FOLEY 3WAY 5CC 16FR (CATHETERS) ×5 IMPLANT
CATH INTERMIT  6FR 70CM (CATHETERS) ×4 IMPLANT
CATH SILICONE 16FRX5CC (CATHETERS) ×2 IMPLANT
CLOTH BEACON ORANGE TIMEOUT ST (SAFETY) ×14 IMPLANT
CONT PATH 16OZ SNAP LID 3702 (MISCELLANEOUS) ×7 IMPLANT
COVER BACK TABLE 60X90IN (DRAPES) ×14 IMPLANT
COVER TIP SHEARS 8 DVNC (MISCELLANEOUS) ×5 IMPLANT
COVER TIP SHEARS 8MM DA VINCI (MISCELLANEOUS) ×2
DECANTER SPIKE VIAL GLASS SM (MISCELLANEOUS) ×6 IMPLANT
DRAPE CAMERA CLOSED 9X96 (DRAPES) ×7 IMPLANT
DRAPE WARM FLUID 44X44 (DRAPE) ×7 IMPLANT
DRSG COVADERM PLUS 2X2 (GAUZE/BANDAGES/DRESSINGS) ×28 IMPLANT
DRSG OPSITE POSTOP 3X4 (GAUZE/BANDAGES/DRESSINGS) ×7 IMPLANT
DURAPREP 26ML APPLICATOR (WOUND CARE) ×7 IMPLANT
ELECT REM PT RETURN 9FT ADLT (ELECTROSURGICAL) ×14
ELECTRODE REM PT RTRN 9FT ADLT (ELECTROSURGICAL) ×10 IMPLANT
GLOVE BIOGEL PI IND STRL 6.5 (GLOVE) IMPLANT
GLOVE BIOGEL PI INDICATOR 6.5 (GLOVE) ×26
GLOVE SURG SS PI 6.5 STRL IVOR (GLOVE) ×22 IMPLANT
GLOVE SURG SS PI 7.0 STRL IVOR (GLOVE) ×30 IMPLANT
GOWN PREVENTION PLUS LG XLONG (DISPOSABLE) ×7 IMPLANT
GOWN STRL NON-REIN LRG LVL3 (GOWN DISPOSABLE) ×7 IMPLANT
GOWN STRL REIN XL XLG (GOWN DISPOSABLE) ×9 IMPLANT
GOWN STRL REUS W/TWL LRG LVL3 (GOWN DISPOSABLE) ×7 IMPLANT
KIT ACCESSORY DA VINCI DISP (KITS) ×2
KIT ACCESSORY DVNC DISP (KITS) ×5 IMPLANT
LEGGING LITHOTOMY PAIR STRL (DRAPES) ×9 IMPLANT
LIQUID BAND (GAUZE/BANDAGES/DRESSINGS) ×12 IMPLANT
NDL SAFETY ECLIPSE 18X1.5 (NEEDLE) ×5 IMPLANT
NDL SPNL 22GX7 QUINCKE BK (NEEDLE) IMPLANT
NEEDLE HYPO 18GX1.5 SHARP (NEEDLE) ×7
NEEDLE HYPO 22GX1.5 SAFETY (NEEDLE) ×2 IMPLANT
NEEDLE SPNL 22GX7 QUINCKE BK (NEEDLE) ×7 IMPLANT
NS IRRIG 1000ML POUR BTL (IV SOLUTION) ×7 IMPLANT
OCCLUDER COLPOPNEUMO (BALLOONS) ×2 IMPLANT
PACK ROBOT WH (CUSTOM PROCEDURE TRAY) ×7 IMPLANT
PACK ROBOTIC GOWN (GOWN DISPOSABLE) ×7 IMPLANT
PACK VAGINAL MINOR WOMEN LF (CUSTOM PROCEDURE TRAY) ×7 IMPLANT
PAD POSITIONER PINK NONSTERILE (MISCELLANEOUS) ×7 IMPLANT
PAD PREP 24X48 CUFFED NSTRL (MISCELLANEOUS) ×16 IMPLANT
PLUG CATH AND CAP STER (CATHETERS) ×2 IMPLANT
SET CYSTO W/LG BORE CLAMP LF (SET/KITS/TRAYS/PACK) ×4 IMPLANT
SET IRRIG TUBING LAPAROSCOPIC (IRRIGATION / IRRIGATOR) ×12 IMPLANT
SET TRI-LUMEN FLTR TB AIRSEAL (TUBING) ×2 IMPLANT
SUT SILK 4 2X60 (SUTURE) ×2 IMPLANT
SUT VIC AB 0 CT1 27 (SUTURE) ×14
SUT VIC AB 0 CT1 27XBRD ANBCTR (SUTURE) ×10 IMPLANT
SUT VIC AB 4-0 PS2 18 (SUTURE) ×14 IMPLANT
SUT VIC AB 4-0 PS2 27 (SUTURE) ×12 IMPLANT
SUT VICRYL 0 UR6 27IN ABS (SUTURE) ×12 IMPLANT
SUT VLOC 180 0 9IN  GS21 (SUTURE) ×2
SUT VLOC 180 0 9IN GS21 (SUTURE) ×5 IMPLANT
SYR 20CC LL (SYRINGE) ×14 IMPLANT
SYR 50ML LL SCALE MARK (SYRINGE) ×7 IMPLANT
SYR BULB IRRIGATION 50ML (SYRINGE) ×2 IMPLANT
SYR TB 1ML 25GX5/8 (SYRINGE) ×2 IMPLANT
SYRINGE 10CC LL (SYRINGE) ×2 IMPLANT
SYRINGE 20CC LL (MISCELLANEOUS) ×2 IMPLANT
SYRINGE 60CC LL (MISCELLANEOUS) ×1 IMPLANT
SYSTEM CONVERTIBLE TROCAR (TROCAR) ×2 IMPLANT
TIP UTERINE 6.7X8CM BLUE DISP (MISCELLANEOUS) ×4 IMPLANT
TOWEL OR 17X24 6PK STRL BLUE (TOWEL DISPOSABLE) ×23 IMPLANT
TROCAR DILATING TIP 12MM 150MM (ENDOMECHANICALS) ×2 IMPLANT
TROCAR DISP BLADELESS 8 DVNC (TROCAR) ×5 IMPLANT
TROCAR DISP BLADELESS 8MM (TROCAR) ×2
TROCAR OPTI TIP 12M 100M (ENDOMECHANICALS) ×7 IMPLANT
TROCAR XCEL NON-BLD 5MMX100MML (ENDOMECHANICALS) ×7 IMPLANT
TUBING NON-CON 1/4 X 20 CONN (TUBING) ×6 IMPLANT
TUBING NON-CON 1/4 X 20' CONN (TUBING) ×1
WARMER LAPAROSCOPE (MISCELLANEOUS) ×7 IMPLANT
WATER STERILE IRR 1000ML POUR (IV SOLUTION) ×21 IMPLANT

## 2014-11-11 NOTE — Anesthesia Procedure Notes (Signed)
Procedure Name: Intubation Date/Time: 11/11/2014 7:35 AM Performed by: Jerlean Peralta, Sheron Nightingale Pre-anesthesia Checklist: Patient identified, Timeout performed, Emergency Drugs available, Suction available and Patient being monitored Patient Re-evaluated:Patient Re-evaluated prior to inductionOxygen Delivery Method: Circle system utilized Preoxygenation: Pre-oxygenation with 100% oxygen Intubation Type: IV induction Ventilation: Mask ventilation without difficulty Laryngoscope Size: Mac and 3 Grade View: Grade III Number of attempts: 2 Airway Equipment and Method: Video-laryngoscopy Placement Confirmation: breath sounds checked- equal and bilateral,  ETT inserted through vocal cords under direct vision and positive ETCO2 Secured at: 22 cm Dental Injury: Teeth and Oropharynx as per pre-operative assessment

## 2014-11-11 NOTE — Op Note (Signed)
11/11/2014  2:52 PM  PATIENT:  Sharon Bowman  39 y.o. female  PRE-OPERATIVE DIAGNOSIS:  Pelvic Pain, Endometriosis  POST-OPERATIVE DIAGNOSIS:  Pelvic Pain, Endometriosis  PROCEDURE:  Procedure(s): ROBOTIC ASSISTED TOTAL HYSTERECTOMY WITH BILATERAL SALPINGO OOPHORECTOMY (Bilateral) ROBOTIC ASSISTED LAPAROSCOPIC LYSIS OF ADHESION (N/A) CYSTOSCOPY/HYDRODISTENSION MARCAINE AND PYRIDIUM AND KENOLOG (N/A)  RETROGRADE PYELOGRAM with BILATERAL URETERAL CATHETHERS (Bilateral) INTRAUTERINE DEVICE (IUD) REMOVAL (N/A) CYSTOSCOPY (N/A)  SURGEON:  Surgeon(s) and Role: Panel 1:    * [name], MD - Assisting    * Aloha Gell, MD - Primary  Panel 2:    * Carolan Clines, MD - Primary  PHYSICIAN ASSISTANT: none  ASSISTANTS: as above   ANESTHESIA:   general  EBL:  Total I/O In: 1600 [I.V.:1600] Out: 675 [Urine:475; Blood:200]  BLOOD ADMINISTERED:none  DRAINS: Urinary Catheter (Foley)   LOCAL MEDICATIONS USED:  MARCAINE     SPECIMEN:  Source of Specimen:  uterus and cervix and bilateral fallopian tubes and ovaries  DISPOSITION OF SPECIMEN:  PATHOLOGY  COUNTS:  NO missing syringe and spinal needle, thought to be thrown into sharps container prior the final count, negative plain film  TOURNIQUET:  * No tourniquets in log *  DICTATION: .Note written in EPIC  PLAN OF CARE: Admit for overnight observation  PATIENT DISPOSITION:  PACU - hemodynamically stable.   Delay start of Pharmacological VTE agent (>24hrs) due to surgical blood loss or risk of bleeding: yes  Procedure:Robotic-assisted total laparoscopic hysterectomy with bilateral salpingo-oophoerectomy, enterolysis following retrograde ureteral stent placement and bladder hydrodistension Complications: none Antibiotics: 2 g Ancef, redosed at the end of the surgical procedure Findings: nl liver edge, nl gallbladder. nl b/l ovaries and tubes, average sized uterus without significant enlargement, rectum adhered  to posterior uterus, small focus of endometriosis in right lateral pelvic side wall. Hemostasis post-procedure with peristalsis of bilateral ureters. Post-procedure cystoscopy with no evidence bladder perforation, no evidence suture in bladder. Bilateral ureteral jets seen with strong flow of bloody urine.   Indications: This is a 39 yo G1P0 with no desire for fertility with h/o endometriosis, pelvic pain, interstitial cystis and PCKD desiring definitive management with hysterectomy/ BSO. Pt has failed conservative therapy and wants definitive surgical management.  Procedure: After informed consent and discussion of alternatives to hysterectomy, the patient was taken to the operating room where general anesthesia was initiated without difficulty. She was prepped and draped in normal sterile fashion in the dorsal supine lithotomy position.   Dr. Gaynelle Arabian placed retrograde ureteral stents, secured them to the foley catheter after first performing a bladder hydrodistention. No Hunners ulcers were seen.   A bimanual examination was done to assess the size and position of the uterus. A weighted speculum was placed in the vagina and deaver retractors were used on the anterior vaginal wall. .The cervix was grasped with tenaculum. The cervix was sounded to 8 cm.  Cervical dilation was done to a 23 Pratt. During this dilation, uterine perforation was suspected. The cervix was assessed to identify the Rumi-Co size.  A small cup and an 8 cm shaft was used. The uterine balloon was inflated.  Gloved were changed. Attention was then turned to the patient's abdomen. 0.5 % marcaine was used prior to all incision. A total of 10 cc of marcaine was used.  A 10 mm incision was made in the umbilicus and blunt and sharp dissection was done until the fascia was identified. This was then grasped with Kocher clamps x2 and entered sharply. A pursestring suture of  0 Vicryl was then placed along the incision and a non-bladed Sheryle Hail  was inserted into the peritoneal cavity. Intraperitoneal placement was confirmed with the use of the camera and pneumoperitoneum was created to 15 mm of mercury. The pursestring suture was secured around the port and pneumoperitoneum was maintained. Brief survey of the abdomen and pelvis was done with findings as above. The abdominal wall was assessed and additional port sites were marked. 8 mm incisions were placed in the right (two) and left lower quadrants (2) and non-bladed ports were placed under direct visualization. The robot was brought to the patient's side and attached with the right side docking. The robotic instruments were placed under direct visualization until proper placement just over the uterus.  I then went to the robotic console. Brief survey of the patient's abdomen and pelvis revealed  findings as above.Bilateral tubes and ovaries were normal. Utero-rectal adhesions were noted. The ureters were identified and their course followed through the pelvis. I began on the left side: the  left infundibulopelvic ligament was serially cauterized with bipolar cautery just under the ovary, with the use of the PK device and transected with the monopolar scissors. The  left round ligament was serially cauterized with the bipolar PK and transected with the monopolar scissors. The anterior leaf of the broad ligament was dissected bluntly then with monopolar cautery was used to separate the anterior and posterior leafs of the broad ligament. This was carried down through to the bladder flap. Both monopolar and bipolar energy were used to create a bladder flap. Attention was then turned to the patient's  Right side: the right ureter was identified, the  IP was cauterized and transected,  the right  broad ligament was cauterized and transected. The anterior and posterior leaves of the broad ligament were bluntly and sharply dissected apart from each other. Monopolar cautery was used to come across the anterior  leaf of the right broad ligament. This dissection was carried down across to the midline to create the bladder flap.  The rectum was dissected off the posterior uterus. Some adhesions deep in the right pelvic side wall were freed by tenting and opening the peritoneum. ONce the rectum was clear from the posterior uterus, the leftt uterine artery was serially cauterized with the PK and transected with the monopolar scissors. The right uterine artery and then serially cauterized with the PK and transected with the monopolar scissors. Bipolar cautery was used on the posterior uterus to seal perforating vessels. The uterus was placed on stretch to allow better visualization of the arteries. The bladder flap was further taken down both sharply and with cautery. Cautery was used for hemostasis on several places along the bladder flap. At this point with the pressure inward on the Rumi, the anterior colpotomy was made with the monopolar scissors. This was carried around to the patient's left side. Additional bipolar cautery was used on the  both angles of the cuff- this was carried out with the PK bipolar. The uterus was then positioned  posteriorly  to allow easy access to the  posterior  colpotomy. This was carried out with the monopolar scissors.  Good hemostasis was noted along the angles of the incision.  With manipulation of the specimen, the uterus, cervix and bilateral tubes were pulled out through the vagina.   Irrigation was used and the vaginal cuff appeared hemostatic. The bladder flap was further developed bluntly.  A 9 inch V-lock suture was placed though the umbilical port. Instruments were  changed to allow a suture cut needle driver through the #1 port and and a long-tipped forceps through the #2 port. The right angle was closed and locked with the V-lock suture. Running suture was carried out to the L angle.  A more superficial layer of suture was placed travelling back to the right angle. The suture  was cut and removed through the robotic port . Excellent hemostasis was noted. Suction irrigation was carried out and hemostasis was assured along the vaginal cuff. The utero-ovarian ligaments were reassessed also found to be hemostatic. The ureters were seen peristalsis. The robotic portion was completed. The robot was undocked.   By standard laparoscopy the pelvis was irrigated and evaluated. Copious irrigation fluid was removed. The patient was placed in reverse Trendelenburg, all additional fluid was suctioned from the abdomen and pelvis the cuff was reinspected and  found to be hemostatic. All pedicles were also found to be hemostatic.  Under direct visualization the ports were removed. Pneumoperitoneum was released and the umbilical port was removed.  The  pursestring suture at the umbilicus was then closed. No additional fascial incisions were closed due to the small size and the nondilated nature of these incisions. A 4-0 Vicryl was used to close the additional laparoscopic ports sites. Good hemostasis was noted.  The ureteral stents were removed. Bloody urine was noted. Decision was made to proceed with cytoscopy. The prior pyridium mixture that was placed in the bladder  Was seen and partially obscured visualization of the trigone. Bladder was washed with saline to clear blood and pyridium to better visualize. Bladder was distended, Air bubble seen at the dome of the bladder, no leak, no active bleeding from the bladder mucosa noted. No suture in the bladder. Both ureteral jets were seen, strong flow of bloody urine. Cystoscopy completed.  Incorrect needle count. Clear x-ray.   Due to concern for laryngeal edema, the pt was kept intubated and taken to the recovery room in a stable fashion.  Zahriyah Joo,Lori-Ann A. 11/11/2014 3:17 PM

## 2014-11-11 NOTE — Anesthesia Postprocedure Evaluation (Signed)
  Anesthesia Post-op Note  Patient: Sharon Bowman  Procedure(s) Performed: Procedure(s): ROBOTIC ASSISTED TOTAL HYSTERECTOMY WITH BILATERAL SALPINGO OOPHORECTOMY (Bilateral) ROBOTIC ASSISTED LAPAROSCOPIC LYSIS OF ADHESION (N/A) CYSTOSCOPY/HYDRODISTENSION MARCAINE AND PYRIDIUM AND KENOLOG (N/A)  RETROGRADE PYELOGRAM with BILATERAL URETERAL CATHETHERS (Bilateral) INTRAUTERINE DEVICE (IUD) REMOVAL (N/A) CYSTOSCOPY (N/A)   Patient is awake and responsive. Pain and nausea are reasonably well controlled. Vital signs are stable and clinically acceptable. Oxygen saturation is clinically acceptable. There are no apparent anesthetic complications at this time. Patient is ready for discharge.

## 2014-11-11 NOTE — Brief Op Note (Signed)
11/11/2014  2:52 PM  PATIENT:  Sharon Bowman  39 y.o. female  PRE-OPERATIVE DIAGNOSIS:  Pelvic Pain, Endometriosis  POST-OPERATIVE DIAGNOSIS:  Pelvic Pain, Endometriosis  PROCEDURE:  Procedure(s): ROBOTIC ASSISTED TOTAL HYSTERECTOMY WITH BILATERAL SALPINGO OOPHORECTOMY (Bilateral) ROBOTIC ASSISTED LAPAROSCOPIC LYSIS OF ADHESION (N/A) CYSTOSCOPY/HYDRODISTENSION MARCAINE AND PYRIDIUM AND KENOLOG (N/A)  RETROGRADE PYELOGRAM with BILATERAL URETERAL CATHETHERS (Bilateral) INTRAUTERINE DEVICE (IUD) REMOVAL (N/A) CYSTOSCOPY (N/A)  SURGEON:  Surgeon(s) and Role: Panel 1:    * [name], MD - Assisting    * Aloha Gell, MD - Primary  Panel 2:    * Carolan Clines, MD - Primary  PHYSICIAN ASSISTANT: none  ASSISTANTS: as above   ANESTHESIA:   general  EBL:  Total I/O In: 1600 [I.V.:1600] Out: 675 [Urine:475; Blood:200]  BLOOD ADMINISTERED:none  DRAINS: Urinary Catheter (Foley)   LOCAL MEDICATIONS USED:  MARCAINE     SPECIMEN:  Source of Specimen:  uterus and cervix and bilateral fallopian tubes and ovaries  DISPOSITION OF SPECIMEN:  PATHOLOGY  COUNTS:  NO missing syringe and spinal needle, thought to be thrown into sharps container prior the final count, negative plain film  TOURNIQUET:  * No tourniquets in log *  DICTATION: .Note written in EPIC  PLAN OF CARE: Admit for overnight observation  PATIENT DISPOSITION:  PACU - hemodynamically stable.   Delay start of Pharmacological VTE agent (>24hrs) due to surgical blood loss or risk of bleeding: yes

## 2014-11-11 NOTE — Op Note (Signed)
Pre-operative diagnosis :   InterstitialCystitis, endometriosis  Postoperative diagnosis:  Same  Operation:  Cystourethroscopy, hydrodistention of the bladder from 400 mL to 5 mL, bilateral retrograde pyelogram with interpretation, bilateral ureteral catheterization, insertion of pretty Marcaine in the bladder, injection of Marcaine and Kenalog in the subtrigonal space.  Surgeon:  Chauncey Cruel. Gaynelle Arabian, MD  First assistant:  None  Anesthesia:  General endotracheal  Preparation:  After appropriate preanesthesia, the patient was brought the operating room, placed on the operating table in the dorsal supine position where the pubis was prepped with Betadine solution and draped in usual fashion. The abdomen was also prepped in preparation for her laparoscopic surgery per Dr. Valentino Saxon.  The armband was double checked. The history was double checked.  Review history:  Pt is referred by Dr. Valentino Saxon for cysto, and HOD and bilateral stents prior to [facility]. She is a former pt of Dr. Matilde Sprang, and Dr. Amalia Hailey, treated for IC. Renal u/s 2015 showed bilateral renal cysts, unchanged ( c/w APCKD)  Note: DVT, Oct. 2015, following R knee surgery, Rx Xaralto  Endometriosis  Polycystic Ovary Disease  Active smoking abuse  Adult Polycystic Kidney Disease ( Dr. Marval Regal)  Cr. 0.74 ( May, 2016)  Suzy Bouchard is diagnosed with interstitial cystitis by Dr. Amalia Hailey. She currently presents with back pain. She does have endometriosis. If bladder instillations help her back pain, this would help sort out whether or not it is definitely due to her interstitial cystitis.   She has frequency which is stable. Marcaine 0.5%, 25 cc instillation has helped in the past.  Statement of  Likelihood of Success: Excellent. TIME-OUT observed.:  Procedure:  Vaginal examination shows a pre-shaven pubis. The vagina appears normal. There is normal estrogenization. The urethra is in  normal position. There are no periurethral masses. There is no cystocele enterocele or rectocele. Cystoscope was placed within the bladder, and the bladder neck is noted to be normal. The bladder base appears to be normal and the trigone is also normal. The ureteral orifices were normal position on the trigone. Hydrodistention was accomplished and the bladder holes only 400 mL. It is hydrodistended to 500 mL.  Ureteral catheterization is then accomplished on the right side, and retrograde pyelogram shows a normal-appearing ureter without hydronephrosis. There is no external deviation of the ureter. The renal pelvis is normal. There appears to be an internal renal pelvis. The ureteral catheter is passed easily into the renal pelvis. The cystoscope is removed, and then replaced. Cystoscopy is again accomplished, and left ureteral orifice is cannulated with a second ureteral catheter, and left retro-progress performed. The left ureter is noted to be normal, without hydronephrosis. There is no evidence of external compression of the ureter. The renal pelvis is normal. The catheter is easily medically and into the renal pelvis. The cystoscope is removed and the ureteral catheters were left in position.  The ureteral catheters are ligated together with a 0 silk suture. A 16 French nonlatex Foley catheter is passed, with 10 mL in the alone. Marcaine 0.25% plain, mixed with Pyridium 200 mg is then inserted through the Foley catheter, and the catheter is plugged. Marcaine 0.25% plain mixed with Kenalog is then injected in the subtrigonal space.  The Foley catheter is then placed to straight drainage, and the ureteral catheters are then ligated to the Foley catheter so that they will not come out during the case. Sutures can be cut at the end of the case, so that the ureteral catheters can be removed, leaving  the Foley catheter in place. The ureteral catheters are placed to glove drainage.  The patient then underwent  a microscopic assisted vaginal hysterectomy per Dr. Valentino Saxon.

## 2014-11-11 NOTE — OR Nursing (Signed)
X-ray timeout performed with Vernona Rieger RN and x-ray techs Baldo Ash and Jack Quarto.

## 2014-11-11 NOTE — Transfer of Care (Signed)
Immediate Anesthesia Transfer of Care Note  Patient: Sharon Bowman  Procedure(s) Performed: Procedure(s): ROBOTIC ASSISTED TOTAL HYSTERECTOMY WITH BILATERAL SALPINGO OOPHORECTOMY (Bilateral) ROBOTIC ASSISTED LAPAROSCOPIC LYSIS OF ADHESION (N/A) CYSTOSCOPY/HYDRODISTENSION MARCAINE AND PYRIDIUM AND KENOLOG (N/A)  RETROGRADE PYELOGRAM with BILATERAL URETERAL CATHETHERS (Bilateral) INTRAUTERINE DEVICE (IUD) REMOVAL (N/A) CYSTOSCOPY (N/A)  Patient Location: PACU  Anesthesia Type:General  Level of Consciousness: awake, alert  and oriented  Airway & Oxygen Therapy: Patient Spontanous Breathing and Patient connected to T-piece oxygen  Post-op Assessment: Report given to RN and Post -op Vital signs reviewed and stable  Post vital signs: Reviewed and stable  Last Vitals:  Filed Vitals:   11/11/14 0611  BP: 108/62  Pulse: 80  Temp: 36.8 C  Resp: 18    Complications: No apparent anesthesia complications

## 2014-11-11 NOTE — OR Nursing (Signed)
Dr. Gaynelle Arabian placed bilateral urethral stents at [phone].  Bilateral urethral Stents removed at 1118 by Dr. Pamala Hurry.

## 2014-11-12 ENCOUNTER — Encounter (HOSPITAL_COMMUNITY): Payer: Self-pay | Admitting: Obstetrics

## 2014-11-12 DIAGNOSIS — N3010 Interstitial cystitis (chronic) without hematuria: Secondary | ICD-10-CM | POA: Diagnosis not present

## 2014-11-12 LAB — CBC
HCT: 29.9 % — ABNORMAL LOW (ref 36.0–46.0)
Hemoglobin: 10 g/dL — ABNORMAL LOW (ref 12.0–15.0)
MCH: 30.9 pg (ref 26.0–34.0)
MCHC: 33.4 g/dL (ref 30.0–36.0)
MCV: 92.3 fL (ref 78.0–100.0)
Platelets: 260 10*3/uL (ref 150–400)
RBC: 3.24 MIL/uL — AB (ref 3.87–5.11)
RDW: 13.1 % (ref 11.5–15.5)
WBC: 16.2 10*3/uL — ABNORMAL HIGH (ref 4.0–10.5)

## 2014-11-12 LAB — COMPREHENSIVE METABOLIC PANEL
ALT: 15 U/L (ref 14–54)
ANION GAP: 4 — AB (ref 5–15)
AST: 19 U/L (ref 15–41)
Albumin: 3.3 g/dL — ABNORMAL LOW (ref 3.5–5.0)
Alkaline Phosphatase: 52 U/L (ref 38–126)
BUN: 10 mg/dL (ref 6–20)
CO2: 26 mmol/L (ref 22–32)
Calcium: 7.9 mg/dL — ABNORMAL LOW (ref 8.9–10.3)
Chloride: 107 mmol/L (ref 101–111)
Creatinine, Ser: 0.67 mg/dL (ref 0.44–1.00)
GFR calc Af Amer: >60 mL/min (ref >60)
GLUCOSE: 113 mg/dL — AB (ref 65–99)
POTASSIUM: 3.8 mmol/L (ref 3.5–5.1)
Sodium: 137 mmol/L (ref 135–145)
Total Bilirubin: 0.4 mg/dL (ref 0.3–1.2)
Total Protein: 5.5 g/dL — ABNORMAL LOW (ref 6.5–8.1)

## 2014-11-12 MED ORDER — OXYCODONE-ACETAMINOPHEN 5-325 MG PO TABS: 1.0000 | ORAL_TABLET | ORAL | Status: DC | PRN

## 2014-11-12 MED ORDER — ENOXAPARIN SODIUM 40 MG/0.4ML ~~LOC~~ SOLN
40.0000 mg | SUBCUTANEOUS | Status: DC
Start: 2014-11-12 — End: 2015-03-09

## 2014-11-12 NOTE — Addendum Note (Signed)
Addendum  created 11/12/14 0751 by Ignacia Bayley, CRNA   Modules edited: Notes Section   Notes Section:  File: [id number]

## 2014-11-12 NOTE — Discharge Summary (Signed)
Sharon Bowman MRN: [id number] DOB/AGE: [birth year] 39 y.o.  Admit date: 11/11/2014 Discharge date: 11/12/14  Admission Diagnoses: Pelvic Pain, Endometriosis  Discharge Diagnoses: Pelvic Pain, Endometriosis        Active Problems:   Endometriosis determined by laparoscopy   Discharged Condition: stable  Hospital Course: Pt admitted for surgery. Ureteral stents and hydrodistension by urology then uncomplicated robotic assisted TLH with enterolysis. Due to bloody urine at the end of the case, cystoscopy done and b/l uterteral jets adn no evidence bladder laceration noted. Pt remained intubated to the PACU.  By POD #1 pt had tolerated regular po, +flatus, pain controlled with po meds, walking in halls, no CP, no SOB, no leg pain, minimal vaginal bleednig, no blood in urine and pt voided well after foley removed. Pt continued on Lovenox. Pt does note hot flashes.   PE: Filed Vitals:   11/12/14 0156 11/12/14 0531 11/12/14 1008 11/12/14 1347  BP: 100/52 107/68 103/59 104/62  Pulse: 90 80 91 88  Temp: 98.6 F (37 C) 98.1 F (36.7 C) 98.7 F (37.1 C) 97.8 F (36.6 C)  TempSrc: Oral Oral Oral Oral  Resp: 15 18 18 16   Height:      Weight:      SpO2: 96% 98% 99% 100%   Gen: well appearing, no distress, up walking in halls CV: RRR Pulm: CTAB Abd: soft distension, appropriately tender. Inc: dressed GU: no staining on pad LE: NT, no edema, no cords.  CBC    Component Value Date/Time   WBC 16.2* 11/12/2014 0517   RBC 3.24* 11/12/2014 0517   HGB 10.0* 11/12/2014 0517   HCT 29.9* 11/12/2014 0517   PLT 260 11/12/2014 0517   MCV 92.3 11/12/2014 0517   MCH 30.9 11/12/2014 0517   MCHC 33.4 11/12/2014 0517   RDW 13.1 11/12/2014 0517   LYMPHSABS 3.4 11/03/2014 0001   MONOABS 0.6 11/03/2014 0001   EOSABS 0.2 11/03/2014 0001   BASOSABS 0.0 11/03/2014 0001     CMP     Component Value Date/Time   NA 137 11/12/2014 0517   K 3.8 11/12/2014 0517   CL 107 11/12/2014 0517   CO2 26  11/12/2014 0517   GLUCOSE 113* 11/12/2014 0517   BUN 10 11/12/2014 0517   CREATININE 0.67 11/12/2014 0517   CREATININE 0.68 03/04/2013 1208   CALCIUM 7.9* 11/12/2014 0517   PROT 5.5* 11/12/2014 0517   ALBUMIN 3.3* 11/12/2014 0517   AST 19 11/12/2014 0517   ALT 15 11/12/2014 0517   ALKPHOS 52 11/12/2014 0517   BILITOT 0.4 11/12/2014 0517   GFRNONAA >60 11/12/2014 0517   GFRAA >60 11/12/2014 0517      Consults: None  Treatments: surgery: ureteral stents, hydrodistension, cystoscopy, robotic assisted TLH/BSO  Disposition: 01-Home or Self Care     Medication List    STOP taking these medications        aspirin 81 MG tablet      TAKE these medications        acetaminophen 500 MG tablet  Commonly known as:  TYLENOL  Take 500 mg by mouth every 6 (six) hours as needed for moderate pain.     amitriptyline 50 MG tablet  Commonly known as:  ELAVIL  Take 50 mg by mouth at bedtime.     B-12 50 MCG Tabs  Take 50 mcg by mouth daily.     calcium-vitamin D 500-200 MG-UNIT per tablet  Commonly known as:  OSCAL WITH D  Take 1 tablet by  mouth daily.     cyclobenzaprine 5 MG tablet  Commonly known as:  FLEXERIL  Take 5 mg by mouth 3 (three) times daily as needed for muscle spasms.     diclofenac sodium 1 % Gel  Commonly known as:  VOLTAREN  Apply topically 4 (four) times daily.     enoxaparin 40 MG/0.4ML injection  Commonly known as:  LOVENOX  Inject 0.4 mLs (40 mg total) into the skin daily.     FE C PO  Take 1 tablet by mouth daily.     HYDROcodone-acetaminophen 5-325 MG per tablet  Commonly known as:  NORCO/VICODIN  Take 2 tablets by mouth every 6 (six) hours as needed for moderate pain.     levonorgestrel 20 MCG/24HR IUD  Commonly known as:  MIRENA  1 each by Intrauterine route once.     MULTI-DAY VITAMINS PO  Take 1 tablet by mouth daily.     oxyCODONE-acetaminophen 5-325 MG per tablet  Commonly known as:  PERCOCET/ROXICET  Take 1-2 tablets by mouth every  4 (four) hours as needed for severe pain (moderate to severe pain (when tolerating fluids)).     PARoxetine 40 MG tablet  Commonly known as:  PAXIL     pentosan polysulfate 100 MG capsule  Commonly known as:  ELMIRON  Take 200 mg by mouth 2 (two) times daily.       f/u in office in 2 wks, cont daily prophylactic Lovenox until then. Will address HRT at that time. Defer iron for acute blood loss anemia due to GI issues.    Signed: Ala Dach., MD 11/12/2014, 3:01 PM

## 2014-11-12 NOTE — Anesthesia Postprocedure Evaluation (Signed)
  Anesthesia Post-op Note  Patient: Sharon Bowman  Procedure(s) Performed: Procedure(s): ROBOTIC ASSISTED TOTAL HYSTERECTOMY WITH BILATERAL SALPINGO OOPHORECTOMY (Bilateral) ROBOTIC ASSISTED LAPAROSCOPIC LYSIS OF ADHESION (N/A) CYSTOSCOPY/HYDRODISTENSION MARCAINE AND PYRIDIUM AND KENOLOG (N/A)  RETROGRADE PYELOGRAM with BILATERAL URETERAL CATHETHERS (Bilateral) INTRAUTERINE DEVICE (IUD) REMOVAL (N/A) CYSTOSCOPY (N/A)  Patient Location: [facility]  Anesthesia Type:General  Level of Consciousness: awake  Airway and Oxygen Therapy: Patient Spontanous Breathing  Post-op Pain: mild  Post-op Assessment: Patient's Cardiovascular Status Stable and Respiratory Function Stable  Post-op Vital Signs: stable  Last Vitals:  Filed Vitals:   11/12/14 0531  BP: 107/68  Pulse: 80  Temp: 36.7 C  Resp: 18    Complications: No apparent anesthesia complications

## 2014-11-12 NOTE — Progress Notes (Signed)
MD order for outpt status verified.  Per [facility] Plan, no review required by Care [facility]gt Dept.

## 2014-11-12 NOTE — Progress Notes (Signed)
lovonox   Injections reviewed   Pt practiced injections  And site  For injections  Reviewed    Written materials   From pharmacy  Given to pt   Prescriptions   Given as ordered  By DR [facility]

## 2015-03-09 ENCOUNTER — Encounter: Payer: Self-pay | Admitting: Medical

## 2015-03-09 ENCOUNTER — Ambulatory Visit (INDEPENDENT_AMBULATORY_CARE_PROVIDER_SITE_OTHER): Payer: BLUE CROSS/BLUE SHIELD | Admitting: Medical

## 2015-03-09 VITALS — BP 110/80 | HR 80 | Temp 98.0°F | Resp 24 | Wt 128.2 lb

## 2015-03-09 DIAGNOSIS — M797 Fibromyalgia: Secondary | ICD-10-CM | POA: Diagnosis not present

## 2015-03-09 DIAGNOSIS — R4589 Other symptoms and signs involving emotional state: Secondary | ICD-10-CM

## 2015-03-09 DIAGNOSIS — R450 Nervousness: Secondary | ICD-10-CM | POA: Diagnosis not present

## 2015-03-09 DIAGNOSIS — G4700 Insomnia, unspecified: Secondary | ICD-10-CM | POA: Diagnosis not present

## 2015-03-09 DIAGNOSIS — R454 Irritability and anger: Secondary | ICD-10-CM

## 2015-03-09 DIAGNOSIS — N3010 Interstitial cystitis (chronic) without hematuria: Secondary | ICD-10-CM | POA: Diagnosis not present

## 2015-03-09 DIAGNOSIS — F411 Generalized anxiety disorder: Secondary | ICD-10-CM

## 2015-03-09 LAB — BASIC METABOLIC PANEL
BUN: 10 mg/dL (ref 7–25)
CALCIUM: 9.9 mg/dL (ref 8.6–10.2)
CO2: 28 mmol/L (ref 20–31)
CREATININE: 0.75 mg/dL (ref 0.50–1.10)
Chloride: 101 mmol/L (ref 98–110)
Glucose, Bld: 71 mg/dL (ref 65–99)
Potassium: 4.6 mmol/L (ref 3.5–5.3)
Sodium: 139 mmol/L (ref 135–146)

## 2015-03-09 LAB — TSH: TSH: 1.406 u[IU]/mL (ref 0.350–4.500)

## 2015-03-09 MED ORDER — DULOXETINE HCL 30 MG PO CPEP: 30.0000 mg | ORAL_CAPSULE | Freq: Every day | ORAL | Status: DC

## 2015-03-09 NOTE — Patient Instructions (Signed)
Recommendations:  Wean off Wellbutrin by taking 1 tablet every other day for 1 week  Go ahead and begin Cymbalta 30mg  once daily in the morning  Follow up with Dr. Estanislado Pandy as scheduled  Begin counseling  I have provided a list of counselors:  [place] [facility] Address: [address] [place], [place] [address] [phone]   [facility] Address: [address], [place], [place] [address] Phone: [phone]   [facility] [phone] office [email or web address] [address], [place], [place], [place] [address]  Rema Fendt, therapist  Toy Cookey, MA, clinical psychologist  Cognitive-Behavior Therapy; Mood Disorders; Anxiety Disorders; adult and child ADHD; Family Therapy; Stress Management; personal growth, and Marital Therapy.    Terrance Mass Ph.D., clinical psychologist Cognitive-Behavior Therapy; Mood Disorders; Anxiety Disorders; Stress     Management   [facility] [address] [place], [place] [address] [phone]   [employer], psychotherapist [address] [place], [place] [address] [phone]

## 2015-03-09 NOTE — Progress Notes (Signed)
Subjective: Chief Complaint  Patient presents with  . fibromy, anxiety   Here for concerns.  Of note, nurse on triage observed her being very fidgety, making noises  Sharon Bowman notes that she is a mess.  She notes that she is seeing Dr. Estanislado Pandy, was diagnosed with Fibromyalgia in July, sees her again 03/24/15.  Has seen her 3 times already.   Was referred to [facility] for therapy and biofeedback.  She is worried about agent orange.    Her father was in [place], and he had issues with agent orange, wonders if this somehow has affected her.    She notes that feet and ankles stay swollen, ankles have fluid in them.   Ankles hurt.  Has plantar fascitis and metatarsalgia, seeing Dr. Eddie Dibbles at [facility].   Was advised she needs some type of foot surgery.  Was told she also has Teacher, early years/pre disease.  Had knee surgery in October.  Had full hysterectomy in May.   Is taking a hormone patch/HRT through gynecology, Dr. Pamala Hurry.    She felt that once she quit tobacco and had hysterectomy she would feel better, bu now she feels worse.   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   Lives alone.  Exercising some.   Had quit walking when she had the blood clot, but now has returned to walking.   Still going to physical therapy for feet, low back and fibromyalgia.   Been doing this since June at [facility].    Anxiety is out the roof.  Was on Paxil, but Dr. Pamala Hurry changed her to Wellbutrin.   Didn't feel like the Paxil was working.   Has some depressed mood, but thinks her issues are more anxiety related.   Considering counseling.  Denies substance abuse.  She has hx/o anxiety, but reportedly her labs with Dr. Estanislado Pandy recently were normal.  Her kidney doctor apparently is prescribing her medication for IC and insomnia.  No other aggravating or relieving factors. No other complaint.   Past Medical History  Diagnosis Date  . Endometriosis   . PCOS (polycystic ovarian  syndrome)   . Interstitial cystitis   . GAD (generalized anxiety disorder)   . Chronic sinusitis   . Polycystic kidney disease   . DVT (deep venous thrombosis) 04/2014    right leg - behind calf, no treated with aspirin daily  . Anxiety   . Depression   . Headache   . Arthritis     osetoarthritis  . Anemia    ROS as in subjective   Objective: BP 110/80 mmHg  Pulse 80  Temp(Src) 98 F (36.7 C) (Oral)  Resp 24  Wt 128 lb 3.2 oz (58.151 kg)  LMP 11/01/2014 (Approximate)  General appearance: seems somewhat anxious, fidgety HEENT: normocephalic, sclerae anicteric, PERRLA, EOMi, nares patent, no discharge or erythema, pharynx normal Oral cavity: MMM, no lesions Neck: supple, no lymphadenopathy, no thyromegaly, no masses Heart: RRR, normal S1, S2, no murmurs Lungs: CTA bilaterally, no wheezes, rhonchi, or rales Extremities: no edema, no cyanosis, no clubbing Pulses: 2+ symmetric, upper and lower extremities, normal cap refill Neurological: alert, oriented x 3, CN2-12 intact, strength normal upper extremities and lower extremities, sensation normal throughout, DTRs 2+ throughout, no cerebellar signs, gait normal Psychiatric: seems anxious, fidgety, restless, eye contact somewhat poor    Assessment: Encounter Diagnoses  Name Primary?  Marland Kitchen GAD (generalized anxiety disorder) Yes  . Fibromyalgia   . Interstitial cystitis   . Insomnia  Plan: PHQ-9 questionnaire with score of 26 today. Mood disorder questionnaire today negative.  She seems to have a little unusual behavior today compared to her norm, so labs today to help further eval her concerns.    Discussed her concerns. Will request recent labs and records from rheumatology.  Advised she c/t PT and biofeedback with [facility].  Begin trial of Cymbalta and wean off Wellbutrin as discussed.  Discussed role of exercise in anxiety and fibromyalgia, healthy diet.   Strongly advised counseling.   Gave list of  providers for her to choose from.  Gave some general counseling on coping skills with fibromyalgia and anxiety along with depressed mood of late.  F/u pending labs.

## 2015-03-11 ENCOUNTER — Telehealth: Payer: Self-pay

## 2015-03-11 NOTE — Telephone Encounter (Signed)
Received medical records from [facility] on 03/11/2015

## 2015-03-14 ENCOUNTER — Telehealth: Payer: Self-pay | Admitting: Medical

## 2015-03-14 NOTE — Telephone Encounter (Signed)
Please check on her labs.   The drug panel is still pending.  I don't know what the turn around time is suppose to be, but I never seem to get these labs back in a timely fashion.  Of note, I did receive records from rheumatology and labs.

## 2015-03-15 LAB — DRUG SCREEN PANEL (SERUM)

## 2015-03-15 NOTE — Telephone Encounter (Signed)
Drug screen results are in

## 2015-03-23 ENCOUNTER — Encounter: Payer: Self-pay | Admitting: Medical

## 2015-03-23 ENCOUNTER — Ambulatory Visit (INDEPENDENT_AMBULATORY_CARE_PROVIDER_SITE_OTHER): Payer: BLUE CROSS/BLUE SHIELD | Admitting: Medical

## 2015-03-23 VITALS — BP 108/60 | HR 80 | Temp 98.2°F | Resp 18 | Wt 130.2 lb

## 2015-03-23 DIAGNOSIS — Z23 Encounter for immunization: Secondary | ICD-10-CM | POA: Diagnosis not present

## 2015-03-23 DIAGNOSIS — M797 Fibromyalgia: Secondary | ICD-10-CM | POA: Diagnosis not present

## 2015-03-23 DIAGNOSIS — G4700 Insomnia, unspecified: Secondary | ICD-10-CM | POA: Diagnosis not present

## 2015-03-23 MED ORDER — CYCLOBENZAPRINE HCL 10 MG PO TABS: 10.0000 mg | ORAL_TABLET | Freq: Every day | ORAL | Status: DC

## 2015-03-23 MED ORDER — DULOXETINE HCL 30 MG PO CPEP: 30.0000 mg | ORAL_CAPSULE | Freq: Two times a day (BID) | ORAL | Status: DC

## 2015-03-23 NOTE — Progress Notes (Signed)
Subjective: Chief Complaint  Patient presents with  . Follow-up    medication   Here for recheck after starting Cymbalta 30mg  daily.  Overall seeing improvement, also did first biofeedback eval this past week with physical therapy.   In general in recent months she notes that she is is a mess.  She notes that she is seeing Dr. Estanislado Pandy, was diagnosed with Fibromyalgia in July, sees her again 03/24/15.  Has seen her 3 times already.   Was referred to [facility] for therapy and biofeedback.  She is worried about agent orange.    Her father was in [place], and he had issues with agent orange, wonders if this somehow has affected her.   Wants checked for lyme disease today.  She notes that feet and ankles stay swollen, ankles have fluid in them.   Ankles hurt.  Has plantar fascitis and metatarsalgia, seeing Dr. Eddie Dibbles at [facility].   Was advised she needs some type of foot surgery.  Was told she also has Teacher, early years/pre disease.  Had knee surgery in October.  Had full hysterectomy in May.   Is taking a hormone patch/HRT through gynecology, Dr. Pamala Hurry.    She felt that once she quit tobacco and had hysterectomy she would feel better, bu now she feels worse.   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   Lives alone.  Exercising some.   Had quit walking when she had the blood clot, but now has returned to walking.   Still going to physical therapy for feet, low back and fibromyalgia.   Been doing this since June at [facility].    At last visit, anxiety was out the roof.  Was on Paxil, but Dr. Pamala Hurry changed her to Wellbutrin.   Didn't feel like the Paxil was working.   Has some depressed mood, but thinks her issues are more anxiety related.   Considering counseling.  Denies substance abuse.  She has hx/o anxiety, but reportedly her labs with Dr. Estanislado Pandy recently were normal.  Her kidney doctor apparently is prescribing her medication for IC and insomnia.  No  other aggravating or relieving factors. No other complaint.  Of note, she mentioned today that she is applying for disability.   Past Medical History  Diagnosis Date  . Endometriosis   . PCOS (polycystic ovarian syndrome)   . Interstitial cystitis   . GAD (generalized anxiety disorder)   . Chronic sinusitis   . Polycystic kidney disease   . DVT (deep venous thrombosis) 04/2014    right leg - behind calf, no treated with aspirin daily  . Anxiety   . Depression   . Headache   . Arthritis     osetoarthritis  . Anemia    ROS as in subjective   Objective: BP 108/60 mmHg  Pulse 80  Temp(Src) 98.2 F (36.8 C) (Oral)  Resp 18  Wt 130 lb 3.2 oz (59.058 kg)  LMP 11/01/2014 (Approximate)  General appearance: seems somewhat anxious, fidgety HEENT: normocephalic, sclerae anicteric, PERRLA, EOMi, nares patent, no discharge or erythema, pharynx normal Oral cavity: MMM, no lesions Neck: supple, no lymphadenopathy, no thyromegaly, no masses Heart: RRR, normal S1, S2, no murmurs Lungs: CTA bilaterally, no wheezes, rhonchi, or rales Extremities: no edema, no cyanosis, no clubbing Pulses: 2+ symmetric, upper and lower extremities, normal cap refill Neurological: alert, oriented x 3, CN2-12 intact, strength normal upper extremities and lower extremities, sensation normal throughout, DTRs 2+ throughout, no cerebellar signs, gait normal Psychiatric:  seems anxious, fidgety, restless, eye contact somewhat poor    Assessment: Encounter Diagnoses  Name Primary?  . Fibromyalgia Yes  . Insomnia   . Need for prophylactic vaccination and inoculation against influenza      Plan: Reviewed recent labs, reviewed 12/2014 notes from rheumatology.   increase to Cymbalta 30mg  BID today, change flexeril to 10mg  QHS.   C/t all other medications unchanged.   Advised she not use marijuana.   F/u with PT for biofeedback and therapy, f/u with rheumatology tomorrow as planned.  C/t exercise regimen.  Add  Lyme disease lab today  Counseled on the influenza virus vaccine.  Vaccine information sheet given.  Influenza vaccine given after consent obtained.  F/u 2wk by phone.

## 2015-03-23 NOTE — Addendum Note (Signed)
Addended by: Patience Musca F on: 03/23/2015 04:42 PM   Modules accepted: Orders

## 2015-03-24 LAB — LYME DISEASE DNA BY PCR(BORRELIA BURG): B burgdorferi DNA: NOT DETECTED

## 2015-03-25 NOTE — Progress Notes (Signed)
Called and left a message.

## 2015-04-04 ENCOUNTER — Telehealth: Payer: Self-pay | Admitting: Medical

## 2015-04-04 NOTE — Telephone Encounter (Signed)
First of all, our office/we in general do not make a determination of disability or recommendations regarding one's disability.  Second, the usual process for disability determination is that disability dept/SSI can request records from [place].  We release those records with her signed consent.  Regarding the letter, I would have to see what they are wanting, but if it is anything more than verifying diagnoses, we DON'T comment on whether someone has a disability or are unable to work.

## 2015-04-04 NOTE — Telephone Encounter (Signed)
Pt called and was wanting to know if you would fill out of letter for her, she is trying to get a lawyer and the lawyer needs a letter from you, she said she has been denied social security twice, pt can be reached at [phone]

## 2015-04-05 NOTE — Telephone Encounter (Signed)
REFER TO Sharon Bowman

## 2015-04-05 NOTE — Telephone Encounter (Signed)
Called pt left message .

## 2015-04-06 NOTE — Telephone Encounter (Signed)
Called pt & she states she has a form from lawyer,  Advised to drop it off and we will see if that is something Sharon Bowman can complete.  Advised we do not do disability determination but can send them copies of medical records and will review the form

## 2015-04-07 ENCOUNTER — Telehealth: Payer: Self-pay | Admitting: Medical

## 2015-04-07 NOTE — Telephone Encounter (Signed)
Pt dropped off a form to be completed. She is trying to hire a attorney to take on her disability case. She has approached [employer]. They have provider her a phone to be completed by her PCP. Please complete form and call Dezaray at [phone] when ready.

## 2015-04-11 NOTE — Telephone Encounter (Signed)
L/m for pt informing her that the form is ready.

## 2015-04-12 ENCOUNTER — Encounter: Payer: Self-pay | Admitting: Medical

## 2015-05-22 ENCOUNTER — Other Ambulatory Visit: Payer: Self-pay | Admitting: Medical

## 2015-05-23 NOTE — Telephone Encounter (Signed)
Is this ok to refill?  

## 2015-07-03 DIAGNOSIS — M797 Fibromyalgia: Secondary | ICD-10-CM

## 2015-07-03 HISTORY — DX: Fibromyalgia: M79.7

## 2015-08-01 ENCOUNTER — Ambulatory Visit (INDEPENDENT_AMBULATORY_CARE_PROVIDER_SITE_OTHER): Payer: BLUE CROSS/BLUE SHIELD | Admitting: Medical

## 2015-08-01 ENCOUNTER — Encounter: Payer: Self-pay | Admitting: Medical

## 2015-08-01 VITALS — BP 98/60 | HR 89 | Wt 128.0 lb

## 2015-08-01 DIAGNOSIS — M797 Fibromyalgia: Secondary | ICD-10-CM | POA: Diagnosis not present

## 2015-08-01 DIAGNOSIS — G4700 Insomnia, unspecified: Secondary | ICD-10-CM | POA: Diagnosis not present

## 2015-08-01 MED ORDER — DULOXETINE HCL 30 MG PO CPEP: ORAL_CAPSULE | ORAL | Status: DC

## 2015-08-01 MED ORDER — DULOXETINE HCL 60 MG PO CPEP: 60.0000 mg | ORAL_CAPSULE | Freq: Every day | ORAL | Status: DC

## 2015-08-01 MED ORDER — AMITRIPTYLINE HCL 50 MG PO TABS: 50.0000 mg | ORAL_TABLET | Freq: Every day | ORAL | Status: DC

## 2015-08-01 MED ORDER — CYCLOBENZAPRINE HCL 10 MG PO TABS: ORAL_TABLET | ORAL | Status: DC

## 2015-08-01 NOTE — Progress Notes (Signed)
   Subjective: Chief Complaint  Patient presents with  . discuss meds    cymblta and flexeril refills. her psycologist up'd her cymbalta and she can not afford to go there anymore because of her co pay now it is to expensive so she wanted you to fill it. she said that the flexeril is to help her sleep    Here for f/u on fibromyalgia.   Since last visit was seeing psychiatrist at [facility] office.  Can't afford copay there, going monthly.  Wants me to take over the medication.   Using Cymbalta 60mg  nightly, 30mg  in the day.  Was on Cymbalta 30mg  BID prior.  This seems to work well.  Would like refill on this.    Insomnia - takes Amitriptyline 50mg  nightly along with flexeril nightly.  This seems to work well.  She notes consistent bed time.  Feels like this is mostly related to menopausal and hormones.    Gets hot flashes.  hasn't seen Dr. Estanislado Pandy since September  Doesn't have a f/u appt at this time.      Sees [facility] yearly, but went twice yearly the last few years.    Still at [facility].   Social life currently - not going out much, just going to work and home.     Exercise - stretching regularly, walking.    Past Medical History  Diagnosis Date  . Endometriosis   . PCOS (polycystic ovarian syndrome)   . Interstitial cystitis   . GAD (generalized anxiety disorder)   . Chronic sinusitis   . Polycystic kidney disease   . DVT (deep venous thrombosis) ([place]) 04/2014    right leg - behind calf, no treated with aspirin daily  . Anxiety   . Depression   . Headache   . Arthritis     osetoarthritis  . Anemia    ROS as in subjective   Objective: BP 98/60 mmHg  Pulse 89  Wt 128 lb (58.06 kg)  SpO2 98%  LMP 11/01/2014 (Approximate)  General appearance: wd, wn, nad otherwise not examined    Assessment: Encounter Diagnoses  Name Primary?  . Fibromyalgia Yes  . Insomnia      Plan:  discussed her concerns.  strongly advised lap swimming at the  [facility] as a good choice for exercise program for fibromyalgia.  We will c/t her current regimen.   C/t routine care with nephrology, gynecology, rheumatology.  Of note, she is considering adopting a child.    Shaylie was seen today for discuss meds.  Diagnoses and all orders for this visit:  Fibromyalgia  Insomnia  Other orders -     amitriptyline (ELAVIL) 50 MG tablet; Take 1 tablet (50 mg total) by mouth at bedtime. -     DULoxetine (CYMBALTA) 30 MG capsule; 1 tablet po daily in the morning -     cyclobenzaprine (FLEXERIL) 10 MG tablet; TAKE 1 TABLET (10 MG TOTAL) BY MOUTH AT BEDTIME. -     DULoxetine (CYMBALTA) 60 MG capsule; Take 1 capsule (60 mg total) by mouth at bedtime.   F/u soon for physical, fasting labs

## 2015-08-31 ENCOUNTER — Encounter: Payer: Self-pay | Admitting: Medical

## 2015-08-31 ENCOUNTER — Ambulatory Visit (INDEPENDENT_AMBULATORY_CARE_PROVIDER_SITE_OTHER): Payer: BLUE CROSS/BLUE SHIELD | Admitting: Medical

## 2015-08-31 VITALS — BP 108/70 | HR 98 | Temp 98.8°F | Wt 126.0 lb

## 2015-08-31 DIAGNOSIS — R0602 Shortness of breath: Secondary | ICD-10-CM | POA: Diagnosis not present

## 2015-08-31 DIAGNOSIS — R05 Cough: Secondary | ICD-10-CM

## 2015-08-31 DIAGNOSIS — Z9289 Personal history of other medical treatment: Secondary | ICD-10-CM

## 2015-08-31 DIAGNOSIS — J45909 Unspecified asthma, uncomplicated: Secondary | ICD-10-CM

## 2015-08-31 DIAGNOSIS — R062 Wheezing: Secondary | ICD-10-CM | POA: Diagnosis not present

## 2015-08-31 DIAGNOSIS — R059 Cough, unspecified: Secondary | ICD-10-CM

## 2015-08-31 HISTORY — DX: Personal history of other medical treatment: Z92.89

## 2015-08-31 HISTORY — DX: Unspecified asthma, uncomplicated: J45.909

## 2015-08-31 MED ORDER — ALBUTEROL SULFATE (2.5 MG/3ML) 0.083% IN NEBU
2.5000 mg | INHALATION_SOLUTION | Freq: Once | RESPIRATORY_TRACT | Status: AC
  Administered 2015-08-31: 2.5 mg via RESPIRATORY_TRACT

## 2015-08-31 MED ORDER — AMOXICILLIN 875 MG PO TABS: 875.0000 mg | ORAL_TABLET | Freq: Two times a day (BID) | ORAL | Status: DC

## 2015-08-31 MED ORDER — ALBUTEROL SULFATE (2.5 MG/3ML) 0.083% IN NEBU: 2.5000 mg | INHALATION_SOLUTION | Freq: Four times a day (QID) | RESPIRATORY_TRACT | Status: DC | PRN

## 2015-08-31 MED ORDER — ALBUTEROL SULFATE HFA 108 (90 BASE) MCG/ACT IN AERS: 2.0000 | INHALATION_SPRAY | Freq: Four times a day (QID) | RESPIRATORY_TRACT | Status: DC | PRN

## 2015-08-31 NOTE — Progress Notes (Signed)
Subjective:  Sharon Bowman is a 40 y.o. female who presents for illness x 1 wk+.  Has cough, croupy cough, lots of mucous, dark productive mucous, feels like mucous in chest wont' come out, +sinus pressure, some sore throat.   Denies fever, but has had nausea, decreased appetite.  +chills.  No hx/o asthma, but has had to use inhalers in the past with pneumonia and bronchitis.   Does have neb machine at home.  Former smoker.  No sick contacts.  No other aggravating or relieving factors. No other complaint.   Past Medical History  Diagnosis Date  . Endometriosis   . PCOS (polycystic ovarian syndrome)   . Interstitial cystitis   . GAD (generalized anxiety disorder)   . Chronic sinusitis   . Polycystic kidney disease   . DVT (deep venous thrombosis) ([place]) 04/2014    right leg - behind calf, no treated with aspirin daily  . Anxiety   . Depression   . Headache   . Arthritis     osetoarthritis  . Anemia     ROS as in subjective   Objective: BP 108/70 mmHg  Pulse 98  Temp(Src) 98.8 F (37.1 C) (Tympanic)  Wt 126 lb (57.153 kg)  LMP 11/01/2014 (Approximate)  General appearance: Alert, WD/WN, no distress                             Skin: warm, no rash                           Head: + mild sinus tenderness                            Eyes: conjunctiva normal, corneas clear, PERRLA                            Ears: flat TMs, external ear canals normal                          Nose: septum midline, turbinates swollen, with erythema and clear discharge             Mouth/throat: MMM, tongue normal, mild pharyngeal erythema                           Neck: supple, no adenopathy, no thyromegaly, non tender                          Heart: RRR, normal S1, S2, no murmurs                         Lungs: scattered wheezes,+rhonchi, no rales      Assessment  Encounter Diagnoses  Name Primary?  . SOB (shortness of breath) Yes  . Cough   . Wheezing       Plan: After initial eval, gave  1 round of albuterol neb.  Begin amoxicillin, rest, hydrate well, can use either neb albuterol or HFA albuterol.  She has neb machine at home.   Call/return if worse or not improved by end of the week.  Return in a few weeks for PFTs.  Sharon Bowman was seen today for cough.  Diagnoses and all orders for this  visit:  SOB (shortness of breath) -     albuterol (PROVENTIL) (2.5 MG/3ML) 0.083% nebulizer solution 2.5 mg; Take 3 mLs (2.5 mg total) by nebulization once. -     Pulse oximetry (single); Future  Cough -     Pulse oximetry (single); Future  Wheezing -     Pulse oximetry (single); Future  Patient voiced understanding of diagnosis, recommendations, and treatment plan.

## 2015-09-21 ENCOUNTER — Ambulatory Visit (INDEPENDENT_AMBULATORY_CARE_PROVIDER_SITE_OTHER): Payer: BLUE CROSS/BLUE SHIELD | Admitting: Medical

## 2015-09-21 ENCOUNTER — Encounter: Payer: Self-pay | Admitting: Medical

## 2015-09-21 VITALS — BP 98/60 | HR 102 | Temp 98.8°F | Resp 16 | Wt 128.0 lb

## 2015-09-21 DIAGNOSIS — D649 Anemia, unspecified: Secondary | ICD-10-CM

## 2015-09-21 DIAGNOSIS — R0602 Shortness of breath: Secondary | ICD-10-CM

## 2015-09-21 DIAGNOSIS — F411 Generalized anxiety disorder: Secondary | ICD-10-CM

## 2015-09-21 DIAGNOSIS — Z87891 Personal history of nicotine dependence: Secondary | ICD-10-CM

## 2015-09-21 NOTE — Progress Notes (Signed)
Subjective:  Sharon Bowman is a 40 y.o. female who presents for f/u on breathing.   At her recent visit 08/31/15 was seen for respiratory illness and wheezing.    At that time was treated with amoxicillin, albuterol here and at home.  Used the inhalers for about a week then the symptoms resolved.   She has a home nebulizer from prior.   Is a former smoker, quit 07/2014.  Smoked for 16 years, 1 ppd.   Keeps some albuterol at home for neb when she has rare flare up or allergy flare up.  Currently denies any breathing issues.   Sees kidney doctor again soon, should have labs there.     She notes at work she normally wears her hat.  Been there a year, was told she could wear a hat originally when she started working there.  Wears the hat to help with anxiety and keeping her head warm.    However, recent different regional manager came by and told her she couldn't wear a hat.   Works at [employer].   Wants a note requesting she be allowed to wear hat.  No other aggravating or relieving factors. No other complaint.   Past Medical History  Diagnosis Date  . Endometriosis   . PCOS (polycystic ovarian syndrome)   . Interstitial cystitis   . GAD (generalized anxiety disorder)   . Chronic sinusitis   . Polycystic kidney disease   . DVT (deep venous thrombosis) ([place]) 04/2014    right leg - behind calf, no treated with aspirin daily  . Anxiety   . Depression   . Headache   . Arthritis     osetoarthritis  . Anemia    ROS as in subjective   Objective: BP 98/60 mmHg  Pulse 102  Temp(Src) 98.8 F (37.1 C) (Tympanic)  Resp 16  Wt 128 lb (58.06 kg)  SpO2 99%  LMP 11/01/2014 (Approximate)  General appearance: Alert, WD/WN, no distress                             Skin: warm, no rash                           Head: no sinus tenderness                            Eyes: conjunctiva normal, corneas clear, PERRLA                            Ears: flat TMs, external ear canals  normal                          Nose: septum midline, turbinates swollen nares parent, no discharge             Mouth/throat: MMM, tongue normal, no pharyngeal erythema                           Neck: supple, no adenopathy, no thyromegaly, non tender                          Heart: RRR, normal S1, S2, no murmurs  Lungs: scattered wheezes,+rhonchi, no rales      Assessment  Encounter Diagnoses  Name Primary?  . SOB (shortness of breath) Yes  . Former smoker   . Anemia, unspecified anemia type   . Anxiety state       Plan: Reviewed PFts which were normal today.   She can use albuterol prn for occasional flare from allergies or illness.  No specific concern for COPD.   She continues to be tobacco free since 07/2014.     Note given to allow hat at work, given anxiety and concerns for keeping head warm.    Jaye was seen today for follow-up.  Diagnoses and all orders for this visit:  SOB (shortness of breath)  Former smoker -     Spirometry with Graph  Anemia, unspecified anemia type  Anxiety state

## 2015-09-29 ENCOUNTER — Encounter: Payer: Self-pay | Admitting: Medical

## 2015-09-29 ENCOUNTER — Ambulatory Visit (INDEPENDENT_AMBULATORY_CARE_PROVIDER_SITE_OTHER): Payer: BLUE CROSS/BLUE SHIELD | Admitting: Medical

## 2015-09-29 VITALS — BP 106/70 | HR 82 | Ht 61.25 in | Wt 128.0 lb

## 2015-09-29 DIAGNOSIS — F329 Major depressive disorder, single episode, unspecified: Secondary | ICD-10-CM | POA: Insufficient documentation

## 2015-09-29 DIAGNOSIS — G4700 Insomnia, unspecified: Secondary | ICD-10-CM | POA: Diagnosis not present

## 2015-09-29 DIAGNOSIS — D649 Anemia, unspecified: Secondary | ICD-10-CM

## 2015-09-29 DIAGNOSIS — N803 Endometriosis of pelvic peritoneum: Secondary | ICD-10-CM

## 2015-09-29 DIAGNOSIS — Z13220 Encounter for screening for lipoid disorders: Secondary | ICD-10-CM | POA: Insufficient documentation

## 2015-09-29 DIAGNOSIS — Z90710 Acquired absence of both cervix and uterus: Secondary | ICD-10-CM | POA: Insufficient documentation

## 2015-09-29 DIAGNOSIS — Z86718 Personal history of other venous thrombosis and embolism: Secondary | ICD-10-CM | POA: Diagnosis not present

## 2015-09-29 DIAGNOSIS — Z23 Encounter for immunization: Secondary | ICD-10-CM

## 2015-09-29 DIAGNOSIS — N3010 Interstitial cystitis (chronic) without hematuria: Secondary | ICD-10-CM | POA: Diagnosis not present

## 2015-09-29 DIAGNOSIS — M797 Fibromyalgia: Secondary | ICD-10-CM

## 2015-09-29 DIAGNOSIS — Z0000 Encounter for general adult medical examination without abnormal findings: Secondary | ICD-10-CM

## 2015-09-29 DIAGNOSIS — Q613 Polycystic kidney, unspecified: Secondary | ICD-10-CM | POA: Diagnosis not present

## 2015-09-29 DIAGNOSIS — M1990 Unspecified osteoarthritis, unspecified site: Secondary | ICD-10-CM | POA: Diagnosis not present

## 2015-09-29 DIAGNOSIS — E282 Polycystic ovarian syndrome: Secondary | ICD-10-CM | POA: Insufficient documentation

## 2015-09-29 DIAGNOSIS — Z87891 Personal history of nicotine dependence: Secondary | ICD-10-CM | POA: Diagnosis not present

## 2015-09-29 DIAGNOSIS — Z8249 Family history of ischemic heart disease and other diseases of the circulatory system: Secondary | ICD-10-CM | POA: Insufficient documentation

## 2015-09-29 DIAGNOSIS — F419 Anxiety disorder, unspecified: Secondary | ICD-10-CM

## 2015-09-29 DIAGNOSIS — J45909 Unspecified asthma, uncomplicated: Secondary | ICD-10-CM | POA: Insufficient documentation

## 2015-09-29 DIAGNOSIS — F418 Other specified anxiety disorders: Secondary | ICD-10-CM | POA: Diagnosis not present

## 2015-09-29 DIAGNOSIS — IMO0002 Reserved for concepts with insufficient information to code with codable children: Secondary | ICD-10-CM

## 2015-09-29 DIAGNOSIS — J4520 Mild intermittent asthma, uncomplicated: Secondary | ICD-10-CM

## 2015-09-29 LAB — POCT URINALYSIS DIPSTICK
BILIRUBIN UA: NEGATIVE
Blood, UA: NEGATIVE
GLUCOSE UA: NEGATIVE
Ketones, UA: NEGATIVE
Leukocytes, UA: NEGATIVE
NITRITE UA: NEGATIVE
Protein, UA: NEGATIVE
Spec Grav, UA: 1.02
Urobilinogen, UA: NEGATIVE
pH, UA: 7

## 2015-09-29 LAB — CBC WITH DIFFERENTIAL/PLATELET
BASOS ABS: 0.1 10*3/uL (ref 0.0–0.1)
BASOS PCT: 1 % (ref 0–1)
EOS ABS: 0.1 10*3/uL (ref 0.0–0.7)
Eosinophils Relative: 1 % (ref 0–5)
HCT: 37 % (ref 36.0–46.0)
HEMOGLOBIN: 12.4 g/dL (ref 12.0–15.0)
LYMPHS ABS: 2.4 10*3/uL (ref 0.7–4.0)
Lymphocytes Relative: 32 % (ref 12–46)
MCH: 31.2 pg (ref 26.0–34.0)
MCHC: 33.5 g/dL (ref 30.0–36.0)
MCV: 93.2 fL (ref 78.0–100.0)
MONOS PCT: 7 % (ref 3–12)
MPV: 10.9 fL (ref 8.6–12.4)
Monocytes Absolute: 0.5 10*3/uL (ref 0.1–1.0)
NEUTROS ABS: 4.5 10*3/uL (ref 1.7–7.7)
NEUTROS PCT: 59 % (ref 43–77)
PLATELETS: 304 10*3/uL (ref 150–400)
RBC: 3.97 MIL/uL (ref 3.87–5.11)
RDW: 13.7 % (ref 11.5–15.5)
WBC: 7.6 10*3/uL (ref 4.0–10.5)

## 2015-09-29 LAB — TSH: TSH: 1.07 m[IU]/L

## 2015-09-29 MED ORDER — DULOXETINE HCL 30 MG PO CPEP: ORAL_CAPSULE | ORAL | Status: DC

## 2015-09-29 MED ORDER — CYCLOBENZAPRINE HCL 10 MG PO TABS: ORAL_TABLET | ORAL | Status: DC

## 2015-09-29 MED ORDER — AMITRIPTYLINE HCL 50 MG PO TABS: 50.0000 mg | ORAL_TABLET | Freq: Every day | ORAL | Status: DC

## 2015-09-29 MED ORDER — DULOXETINE HCL 60 MG PO CPEP: 60.0000 mg | ORAL_CAPSULE | Freq: Every day | ORAL | Status: DC

## 2015-09-29 NOTE — Progress Notes (Signed)
Subjective:   HPI Chief Complaint  Patient presents with  . Annual Exam    fasting. sees eye doctor for contacts. sees dentist. kidney, dr.coladonato. dr Amalia Hailey, urinary. Mileah fogleman, GYN. dr devashwar, Fibromyalgia. no problems or concerns.      Sharon Bowman is a 40 y.o. female who presents for a complete physical.  Medical care team includes:  Dr. Aloha Gell, gynecology  Dr. Estanislado Pandy, rheumatology  Dr. Marval Regal, nephrology  Dr. Amalia Hailey, urology   Dr. Merideth Abbey eye doctor Glade Lloyd, DAV[facility], PA-C here for primary care   Concerns: Has form she wants completed to be a foster mother.  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  Has wanted to do this since last year.    Reviewed their medical, surgical, family, social, medication, and allergy history and updated chart as appropriate.  Past Medical History  Diagnosis Date  . Endometriosis   . PCOS (polycystic ovarian syndrome)   . Interstitial cystitis   . GAD (generalized anxiety disorder)   . Chronic sinusitis   . Polycystic kidney disease   . DVT (deep venous thrombosis) ([place]) 04/2014    right leg - behind calf, no treated with aspirin daily  . Anxiety   . Depression   . Headache   . Arthritis     osetoarthritis  . Anemia   . History of PFTs 08/2015    normal  . Former smoker   . Allergic asthma 08/2015  . Family history of premature CAD     Past Surgical History  Procedure Laterality Date  . Polycystic kindey disease      [place] [facility])  . Laparoscopy      X 2 ENDOMETRIOSIS.  Marland Kitchen Bladder surgery      BLADDER STRETCH X 3  . Leg surgery  04/2014    right knee - tumor - [facility]  . Carpel radial tunnel  2014  . Carpel tunnel      left  and right hand   . Tooth extraction    . Robotic assisted total hysterectomy with bilateral salpingo oopherectomy Bilateral 11/11/2014    Procedure: ROBOTIC  ASSISTED TOTAL HYSTERECTOMY WITH BILATERAL SALPINGO OOPHORECTOMY;  Surgeon: Aloha Gell, MD;  Location: [facility];  Service: Gynecology;  Laterality: Bilateral;  . Robotic assisted laparoscopic lysis of adhesion N/A 11/11/2014    Procedure: ROBOTIC ASSISTED LAPAROSCOPIC LYSIS OF ADHESION;  Surgeon: Aloha Gell, MD;  Location: [facility];  Service: Gynecology;  Laterality: N/A;  . Iud removal N/A 11/11/2014    Procedure: INTRAUTERINE DEVICE (IUD) REMOVAL;  Surgeon: Aloha Gell, MD;  Location: [facility];  Service: Gynecology;  Laterality: N/A;  . Cystoscopy N/A 11/11/2014    Procedure: CYSTOSCOPY;  Surgeon: Aloha Gell, MD;  Location: [facility];  Service: Gynecology;  Laterality: N/A;  . Cysto with hydrodistension N/A 11/11/2014    Procedure: CYSTOSCOPY/HYDRODISTENSION MARCAINE AND PYRIDIUM AND Esmeralda Links;  Surgeon: Carolan Clines, MD;  Location: [facility];  Service: Urology;  Laterality: N/A;  . Cystoscopy with retrograde pyelogram, ureteroscopy and stent placement Bilateral 11/11/2014    Procedure:  RETROGRADE PYELOGRAM with BILATERAL URETERAL CATHETHERS;  Surgeon: Carolan Clines, MD;  Location: [facility];  Service: Urology;  Laterality: Bilateral;    Social History   Social History  . Marital Status: Single    Spouse Name: N/A  . Number of Children: N/A  . Years of Education: N/A   Occupational History  . Not on  file.   Social History Main Topics  . Smoking status: Former Smoker -- 1.00 packs/day for 18 years    Types: Cigarettes    Quit date: 07/02/2014  . Smokeless tobacco: Never Used  . Alcohol Use: No  . Drug Use: Yes    Special: Marijuana     Comment: marijuana use  - last use 2 yrs ago  . Sexual Activity: No     Comment: Mirena   Other Topics Concern  . Not on file   Social History Narrative    Family History  Problem Relation Age of Onset  . Diabetes Mother   . Macular degeneration Mother   . Hyperlipidemia Mother   . Hypertension Mother   . Polycystic kidney disease  Father   . Heart disease Father 71    MI, CABG  . Diabetes Father   . Hepatitis Father     C  . Hypothyroidism Father   . Hypertension Father   . Cancer Maternal Grandfather     bone  . Heart disease Paternal Grandmother   . Heart disease Paternal Grandfather      Current outpatient prescriptions:  .  amitriptyline (ELAVIL) 50 MG tablet, Take 1 tablet (50 mg total) by mouth at bedtime., Disp: 90 tablet, Rfl: 3 .  aspirin 81 MG tablet, Take 81 mg by mouth daily., Disp: , Rfl:  .  Bupivacaine HCl (MARCAINE IJ), Inject as directed., Disp: , Rfl:  .  calcium-vitamin D (OSCAL WITH D) 500-200 MG-UNIT per tablet, Take 1 tablet by mouth daily.  , Disp: , Rfl:  .  Cyanocobalamin (B-12) 50 MCG TABS, Take 50 mcg by mouth daily. , Disp: , Rfl:  .  cyclobenzaprine (FLEXERIL) 10 MG tablet, TAKE 1 TABLET (10 MG TOTAL) BY MOUTH AT BEDTIME., Disp: 90 tablet, Rfl: 1 .  diclofenac sodium (VOLTAREN) 1 % GEL, Apply topically 4 (four) times daily., Disp: , Rfl:  .  DULoxetine (CYMBALTA) 30 MG capsule, 1 tablet po daily in the morning, Disp: 90 capsule, Rfl: 3 .  DULoxetine (CYMBALTA) 60 MG capsule, Take 1 capsule (60 mg total) by mouth at bedtime., Disp: 90 capsule, Rfl: 3 .  estradiol (CLIMARA - DOSED IN MG/24 HR) 0.1 mg/24hr patch, Place 0.1 mg onto the skin once a week., Disp: , Rfl:  .  Iron-Vitamin C (FE C PO), Take 1 tablet by mouth daily. , Disp: , Rfl:  .  Multiple Vitamin (MULTI-DAY VITAMINS PO), Take 1 tablet by mouth daily.  , Disp: , Rfl:  .  pentosan polysulfate (ELMIRON) 100 MG capsule, Take 200 mg by mouth 2 (two) times daily., Disp: , Rfl:  .  acetaminophen (TYLENOL) 500 MG tablet, Take 500 mg by mouth every 6 (six) hours as needed for moderate pain. Reported on 09/29/2015, Disp: , Rfl:  .  albuterol (PROVENTIL HFA;VENTOLIN HFA) 108 (90 Base) MCG/ACT inhaler, Inhale 2 puffs into the lungs every 6 (six) hours as needed for wheezing or shortness of breath. (Patient not taking: Reported on  09/21/2015), Disp: 1 Inhaler, Rfl: 0 .  albuterol (PROVENTIL) (2.5 MG/3ML) 0.083% nebulizer solution, Take 3 mLs (2.5 mg total) by nebulization every 6 (six) hours as needed for wheezing or shortness of breath. (Patient not taking: Reported on 09/21/2015), Disp: 75 mL, Rfl: 2 .  aspirin-acetaminophen-caffeine (EXCEDRIN MIGRAINE) [id number] MG per tablet, Take 1 tablet by mouth every 6 (six) hours as needed for headache or migraine. Reported on 09/29/2015, Disp: , Rfl:  .  traMADol (ULTRAM) 50 MG  tablet, Take by mouth every 6 (six) hours as needed. Reported on 09/29/2015, Disp: , Rfl:   Allergies  Allergen Reactions  . Latex Itching and Rash    Review of Systems Constitutional: -fever, -chills, -sweats, -unexpected weight change, -decreased appetite, +fatigue Allergy: -sneezing, -itching, -congestion Dermatology: -changing moles, --rash, -lumps ENT: -runny nose, -ear pain, -sore throat, -hoarseness, -sinus pain, -teeth pain, - ringing in ears, -hearing loss, -nosebleeds Cardiology: -chest pain, -palpitations, -swelling, -difficulty breathing when lying flat, -waking up short of breath Respiratory: -cough, -shortness of breath, -difficulty breathing with exercise or exertion, -wheezing, -coughing up blood Gastroenterology: -abdominal pain, -nausea, -vomiting, -diarrhea, -constipation, -blood in stool, -changes in bowel movement, -difficulty swallowing or eating Hematology: -bleeding, -bruising  Musculoskeletal: +joint aches, +muscle aches, -joint swelling, -back pain, -neck pain, -cramping, -changes in gait Ophthalmology: denies vision changes, eye redness, itching, discharge Urology: -burning with urination, -difficulty urinating, -blood in urine, -urinary frequency, -urgency, -incontinence Neurology: +headache, -weakness, -tingling, -numbness, -memory loss, -falls, -dizziness Psychology: +depressed mood, -agitation, +sleep problems   Objective:   Physical Exam  BP 106/70 mmHg  Pulse 82  Ht  5' 1.25" (1.556 m)  Wt 128 lb (58.06 kg)  BMI 23.98 kg/m2  LMP 11/01/2014 (Approximate)  Wt Readings from Last 3 Encounters:  09/29/15 128 lb (58.06 kg)  09/21/15 128 lb (58.06 kg)  08/31/15 126 lb (57.153 kg)    General appearance: alert, no distress, WD/WN, white female Skin: scattered macules, no worrisome lesions, lacy tattoo low back, nails somewhat slightly grooved appearing, few pits scattered HEENT: normocephalic, conjunctiva/corneas normal, sclerae anicteric, PERRLA, EOMi, nares patent, no discharge or erythema, pharynx normal Oral cavity: MMM, tongue normal, teeth in good repair Neck: supple, no lymphadenopathy, no thyromegaly, no masses, normal ROM, no bruits Chest: non tender, normal shape and expansion Heart: RRR, normal S1, S2, no murmurs Lungs: CTA bilaterally, no wheezes, rhonchi, or rales Abdomen: +bs, soft, 2 bilat mid abdominal port surgical scars, non tender, non distended, no masses, no hepatomegaly, no splenomegaly, no bruits Back: non tender, normal ROM, no scoliosis Musculoskeletal: upper extremities non tender, no obvious deformity, normal ROM throughout, lower extremities non tender, no obvious deformity, normal ROM throughout Extremities: no edema, no cyanosis, no clubbing Pulses: 2+ symmetric, upper and lower extremities, normal cap refill Neurological: alert, oriented x 3, CN2-12 intact, strength normal upper extremities and lower extremities, sensation normal throughout, DTRs 2+ throughout, no cerebellar signs, gait normal Psychiatric: normal affect, behavior normal, pleasant  Breast/gyn/rectal - deferred to gyn   Adult ECG Report  Indication: physical, family hx/o premature CAD  Rate: 75 bpm  Rhythm: normal sinus rhythm  QRS Axis: 52 degrees  PR Interval: 116ms  QRS Duration: 19ms  QTc: 418ms  Conduction Disturbances: none  Other Abnormalities: none  Patient's cardiac risk factors are: family history of premature cardiovascular disease.  EKG  comparison: none  Narrative Interpretation: normal EKG    Assessment and Plan :    Encounter Diagnoses  Name Primary?  . Encounter for health maintenance examination in adult Yes  . Fibromyalgia   . IC (interstitial cystitis)   . Polycystic kidney disease   . Endometriosis of pelvis   . Insomnia   . PCOS (polycystic ovarian syndrome)   . Anxiety and depression   . Osteoarthritis, unspecified osteoarthritis type, unspecified site   . Anemia, unspecified anemia type   . History of DVT (deep vein thrombosis)   . Former smoker   . S/P hysterectomy   . Allergic asthma, mild intermittent, uncomplicated   .  Need for Tdap vaccination   . Family history of premature CAD     Physical exam - discussed healthy lifestyle, diet, exercise, preventative care, vaccinations, and addressed their concerns.   Counseled on the Tdap (tetanus, diptheria, and acellular pertussis) vaccine.  Vaccine information sheet given. Tdap vaccine given after consent obtained. See your eye doctor yearly for routine vision care. See your dentist yearly for routine dental care including hygiene visits twice yearly. See your gynecologist yearly for routine gynecological care. F/u with her specialists as usual.  Fibromyalgia, depression - compliant with 60+30mg  Cymbalta daily insomnia - taking Elavil 50mg  daily without c/o. Follow-up pending labs  Nokomis was seen today for annual exam.  Diagnoses and all orders for this visit:  Encounter for health maintenance examination in adult -     Lipid panel -     TSH -     CBC with Differential/Platelet -     VITAMIN D 25 Hydroxy (Vit-D Deficiency, Fractures) -     Hepatic function panel -     Renal Function Panel -     EKG 12-Lead -     POCT urinalysis dipstick -     Tdap vaccine greater than or equal to 7yo IM  Fibromyalgia -     TSH  IC (interstitial cystitis)  Polycystic kidney disease -     Renal Function Panel  Endometriosis of  pelvis  Insomnia  PCOS (polycystic ovarian syndrome)  Anxiety and depression  Osteoarthritis, unspecified osteoarthritis type, unspecified site  Anemia, unspecified anemia type -     CBC with Differential/Platelet  History of DVT (deep vein thrombosis)  Former smoker  S/P hysterectomy  Allergic asthma, mild intermittent, uncomplicated  Need for Tdap vaccination  Family history of premature CAD -     EKG 12-Lead  Other orders -     DULoxetine (CYMBALTA) 60 MG capsule; Take 1 capsule (60 mg total) by mouth at bedtime. -     DULoxetine (CYMBALTA) 30 MG capsule; 1 tablet po daily in the morning -     cyclobenzaprine (FLEXERIL) 10 MG tablet; TAKE 1 TABLET (10 MG TOTAL) BY MOUTH AT BEDTIME. -     amitriptyline (ELAVIL) 50 MG tablet; Take 1 tablet (50 mg total) by mouth at bedtime.

## 2015-09-30 ENCOUNTER — Other Ambulatory Visit: Payer: Self-pay | Admitting: Medical

## 2015-09-30 LAB — LIPID PANEL
Cholesterol: 206 mg/dL — ABNORMAL HIGH (ref 125–200)
HDL: 69 mg/dL (ref >=46)
LDL CALC: 114 mg/dL (ref <130)
Total CHOL/HDL Ratio: 3 Ratio (ref <=5.0)
Triglycerides: 116 mg/dL (ref <150)
VLDL: 23 mg/dL (ref <30)

## 2015-09-30 LAB — RENAL FUNCTION PANEL
Albumin: 4.1 g/dL (ref 3.6–5.1)
BUN: 12 mg/dL (ref 7–25)
CALCIUM: 9.4 mg/dL (ref 8.6–10.2)
CHLORIDE: 101 mmol/L (ref 98–110)
CO2: 25 mmol/L (ref 20–31)
Creat: 0.72 mg/dL (ref 0.50–1.10)
GLUCOSE: 83 mg/dL (ref 65–99)
PHOSPHORUS: 3.4 mg/dL (ref 2.5–4.5)
POTASSIUM: 4.6 mmol/L (ref 3.5–5.3)
Sodium: 136 mmol/L (ref 135–146)

## 2015-09-30 LAB — HEPATIC FUNCTION PANEL
ALK PHOS: 51 U/L (ref 33–115)
ALT: 17 U/L (ref 6–29)
AST: 18 U/L (ref 10–30)
Albumin: 4.1 g/dL (ref 3.6–5.1)
Bilirubin, Direct: <0.1 mg/dL (ref <=0.2)
Total Bilirubin: 0.2 mg/dL (ref 0.2–1.2)
Total Protein: 6.4 g/dL (ref 6.1–8.1)

## 2015-09-30 LAB — VITAMIN D 25 HYDROXY (VIT D DEFICIENCY, FRACTURES): VIT D 25 HYDROXY: 24 ng/mL — AB (ref 30–100)

## 2015-09-30 MED ORDER — VITAMIN D (ERGOCALCIFEROL) 1.25 MG (50000 UNIT) PO CAPS
50000.0000 [IU] | ORAL_CAPSULE | ORAL | Status: DC
Start: 2015-09-30 — End: 2016-07-13

## 2015-10-24 ENCOUNTER — Other Ambulatory Visit: Payer: Self-pay | Admitting: Medical

## 2015-10-24 NOTE — Telephone Encounter (Signed)
Is this ok to refill?  

## 2015-12-22 ENCOUNTER — Encounter: Payer: Self-pay | Admitting: Family Medicine

## 2015-12-22 ENCOUNTER — Ambulatory Visit (INDEPENDENT_AMBULATORY_CARE_PROVIDER_SITE_OTHER): Payer: BLUE CROSS/BLUE SHIELD | Admitting: Family Medicine

## 2015-12-22 VITALS — BP 110/80 | HR 83 | Wt 131.8 lb

## 2015-12-22 DIAGNOSIS — Q613 Polycystic kidney, unspecified: Secondary | ICD-10-CM

## 2015-12-22 DIAGNOSIS — M797 Fibromyalgia: Secondary | ICD-10-CM | POA: Diagnosis not present

## 2015-12-22 DIAGNOSIS — R0602 Shortness of breath: Secondary | ICD-10-CM | POA: Diagnosis not present

## 2015-12-22 DIAGNOSIS — N3010 Interstitial cystitis (chronic) without hematuria: Secondary | ICD-10-CM

## 2015-12-22 NOTE — Progress Notes (Signed)
   Subjective:    Patient ID: Sharon Bowman, female    DOB: [birth year], 40 y.o.   MRN: [id number]  HPI She is concerned over not so on her collarbone that she recently noticed. She also complains of shortness of breath. She states that she cannot take a deep breath but is having no cough, sore throat, fever, earache, chest pain or shortness of breath is with any activity. She does have underlying polycystic kidney disease and apparently is followed regularly by renal. She also has interstitial cystitis and is seeing Dr. Amalia Hailey for this. She is also followed by Dr. Bennie Dallas for her fibromyalgia and is on multiple medications including Cymbalta, muscle relaxer, amitriptyline and Voltaren cream. She does have underlying anxiety as well.  Review of Systems     Objective:   Physical Exam Alert and in no distress. Tympanic membranes and canals are normal. Pharyngeal area is normal. Neck is supple without adenopathy or thyromegaly. Cardiac exam shows a regular sinus rhythm without murmurs or gallops. Lungs are clear to auscultation. Chest wall exam is normal. She pointed to the [facility] joint is to where she was having difficulty.        Assessment & Plan:  Shortness of breath  IC (interstitial cystitis)  Polycystic kidney disease  Fibromyalgia She will continue on her present medication regimen and follow-up as previously set up. I explained that the lesions she was feeling are essentially normal and was the [facility] joint. We discussed the shortness of breath and at this time I cannot relate this to anything in particular. I strongly encouraged watchful waiting. Over 25 minutes, greater than 50% spent in counseling and coordination of care

## 2016-01-23 ENCOUNTER — Other Ambulatory Visit: Payer: Self-pay | Admitting: Medical

## 2016-01-23 NOTE — Telephone Encounter (Signed)
Is this okay to refill? 

## 2016-01-24 NOTE — Telephone Encounter (Signed)
I have called in flexeril per ST

## 2016-03-29 ENCOUNTER — Other Ambulatory Visit (INDEPENDENT_AMBULATORY_CARE_PROVIDER_SITE_OTHER): Payer: BLUE CROSS/BLUE SHIELD

## 2016-03-29 DIAGNOSIS — Z23 Encounter for immunization: Secondary | ICD-10-CM

## 2016-04-19 ENCOUNTER — Ambulatory Visit: Payer: BLUE CROSS/BLUE SHIELD | Admitting: Rheumatology

## 2016-05-18 ENCOUNTER — Ambulatory Visit (INDEPENDENT_AMBULATORY_CARE_PROVIDER_SITE_OTHER): Payer: Worker's Compensation | Admitting: Family Medicine

## 2016-05-18 ENCOUNTER — Ambulatory Visit (INDEPENDENT_AMBULATORY_CARE_PROVIDER_SITE_OTHER): Payer: Worker's Compensation

## 2016-05-18 VITALS — BP 98/62 | HR 81 | Temp 98.3°F | Resp 16 | Ht 62.0 in | Wt 127.0 lb

## 2016-05-18 DIAGNOSIS — M25572 Pain in left ankle and joints of left foot: Secondary | ICD-10-CM

## 2016-05-18 NOTE — Progress Notes (Signed)
Subjective:  By signing my name below, I, Essence Howell, attest that this documentation has been prepared under the direction and in the presence of Delman Cheadle, MD Electronically Signed: Ladene Artist, ED Scribe 05/18/2016 at 11:39 AM.   Patient ID: Sharon Bowman, female    DOB: 12/03/1975, 40 y.o.   MRN: [id number]  Chief Complaint  Patient presents with  . Ankle Injury    WC, left, someone stepped on it a few weeks ago   HPI HPI Comments: Sharon Bowman is a 40 y.o. female who presents to the [facility] complaining of a left ankle injury sustained 3 weeks ago while at work. Pt states that someone stepped on top of her left ankle 3 weeks ago while at work. 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 Pt reports increased pain with ambulating. She reports an associated symptom of swelling to the area since. Pt has tried ice and elevation over the past 3 weeks without significant relief. She notes a h/o fluid to both ankles and plantar fascitis. She is currently in physical therapy for fibromyalgia but states that this has not improved ankle pain either. 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  Medical history inc meds and allergies reviewed in detail.  Review of Systems  Constitutional: Positive for activity change. Negative for appetite change.  Cardiovascular: Positive for leg swelling.  Musculoskeletal: Positive for arthralgias, gait problem and joint swelling. Negative for back pain.  Skin: Negative for color change and wound.  Hematological: Does not bruise/bleed easily.   BP 98/62 (BP Location: Right Arm, Patient Position: Sitting, Cuff Size: Normal)   Pulse 81   Temp 98.3 F (36.8 C)   Resp 16   Ht 5\' 2"  (1.575 m)   Wt 127 lb (57.6 kg)   LMP 11/01/2014 (Approximate)   SpO2 99%   BMI 23.23 kg/m     Objective:   Physical Exam  Constitutional: She is oriented to person, place, and time. She appears well-developed and well-nourished. No distress.  HENT:    Head: Normocephalic and atraumatic.  Eyes: Conjunctivae and EOM are normal.  Neck: Neck supple. No tracheal deviation present.  Cardiovascular: Normal rate, regular rhythm and normal heart sounds.   Pulses:      Dorsalis pedis pulses are 1+ on the left side.       Posterior tibial pulses are 1+ on the left side.  Pulmonary/Chest: Effort normal and breath sounds normal. No respiratory distress.  Musculoskeletal: Normal range of motion.  L ankle: Mild effusion on the lateral malleolus. No tenderness to palpation over the proximal 5th metatarsal head. Positive edema over lateral tarsals around the ATIF ligaments.   Neurological: She is alert and oriented to person, place, and time.  Skin: Skin is warm and dry.  Psychiatric: She has a normal mood and affect. Her behavior is normal.  Nursing note and vitals reviewed.  Dg Ankle Complete Left  Result Date: 05/18/2016 CLINICAL DATA:  Patient's foot was stepped on 3 weeks ago. Pain and swelling. EXAM: LEFT ANKLE COMPLETE - 3+ VIEW COMPARISON:  None. FINDINGS: There is no evidence of fracture, dislocation, or joint effusion. Soft tissues are unremarkable. Small Achilles spur is present. IMPRESSION: No evidence for acute  abnormality. Electronically Signed   By: Nolon Nations M.D.   On: 05/18/2016 12:18      Assessment & Plan:   1. Pain of joint of left ankle and foot   ok to use otc  acetaminophen prn. Pt has been told not to use nsaids due to her chronic renal dz. Light duty at work with partial weightbearing  Orders Placed This Encounter  Procedures  . DG Ankle Complete Left    Standing Status:   Future    Number of Occurrences:   1    Standing Expiration Date:   05/18/2017    Order Specific Question:   Reason for Exam (SYMPTOM  OR DIAGNOSIS REQUIRED)    Answer:   persistent swelling over lateral tarsal bones/aitf area since pt's foot was stepped on sev wks prior    Order Specific Question:   Is the patient pregnant?    Answer:   No     Order Specific Question:   Preferred imaging location?    Answer:   External    I personally performed the services described in this documentation, which was scribed in my presence. The recorded information has been reviewed and considered, and addended by me as needed.   Delman Cheadle, M.D.  [facility] [address] [place], [place] [address] [phone] phone [phone] fax  05/30/16 9:36 PM

## 2016-05-18 NOTE — Patient Instructions (Addendum)
IF you received an x-ray today, you will receive an invoice from [facility]. Please contact [facility] at [phone] with questions or concerns regarding your invoice.   IF you received labwork today, you will receive an invoice from [employer]. Please contact [facility] at [phone] with questions or concerns regarding your invoice.   Our billing staff will not be able to assist you with questions regarding bills from these companies.  You will be contacted with the lab results as soon as they are available. The fastest way to get your results is to activate your My Chart account. Instructions are located on the last page of this paperwork. If you have not heard from [place] regarding the results in 2 weeks, please contact this office.      Chronic Ankle Instability Rehab Ask your health care provider which exercises are safe for you. Do exercises exactly as told by your health care provider and adjust them as directed. It is normal to feel mild stretching, pulling, tightness, or discomfort as you do these exercises, but you should stop right away if you feel sudden pain or your pain gets worse.Do not begin these exercises until told by your health care provider. Strengthening exercises These exercises build strength and endurance in your ankle. Endurance is the ability to use your muscles for a long time, even after they get tired. Exercise A: Eversion 1. Sit on the floor with your legs straight out in front of you. 2. Loop a rubber exercise band around the ball of your left / right foot. The ball of your foot is on the walking surface, right under your toes. Hold the ends of the band in your hands, or secure the band to a stable object. 3. Slowly push your foot outward, away from your other leg. 4. Hold this position for __________ seconds. 5. Slowly return your foot to the starting position. Repeat __________ times. Complete this exercise  __________ times per day. Exercise B: Heel walking (dorsiflexion) Walk on your heels for __________ . Keep your toes as high as possible. Repeat __________ times. Complete this exercise __________ times per day. Exercise C: Toe walking (plantar flexion) Walk on your toes for __________. Keep your heels as high as possible. Repeat __________ times. Complete this exercise __________ times per day. Balance exercises These exercises improve or maintain your balance. Balance is important in improving ankle stability and preventing falls. Exercise D: Tandem walking Do this exercise in a hallway or room that is at least 10 ft. (3 m) long. 1. Stand with one foot directly in front of the other. You can use the walls to help you balance if needed, but try not to use them for support. 2. Slowly lift your back foot and place it directly in front of your other foot. 3. Continue to walk in this heel-to-toe way for __________. Repeat __________ times. Complete this exercise __________ times per day. Exercise E: Single leg stand 1. Without shoes, stand near a railing or in a doorway. You may hold onto the railing or door frame as needed for balance. 2. Stand on your left / right foot. Keep your big toe down on the floor and try to keep your arch lifted. If this is too easy, you can try doing it while you do one of these actions:  Keep your eyes closed.  Stand on a pillow.  Throw a ball against a wall. 3. Hold this position for __________ seconds. Repeat __________  times. Complete this exercise __________ times per day. Exercise F: Inversion/eversion 1 You will need a balance board for this exercise. Ask your health care provider where you can get a balance board or how you can make one. 1. Stand on a non-carpeted surface near a countertop or wall. 2. Step onto the balance board so your feet are hip-width apart. 3. Keep your feet in place and keep your upper body and hips steady. Using only your feet  and ankles, tip the board from side to side as far as you can, alternating between tipping to the left and to the right. If you can, tip the board so it silently taps the floor. Do not let the board forcefully hit the floor. Repeat __________ times, pausing from time to time to hold a steady position. Complete this exercise __________ times a day. Exercise G: Inversion/eversion 2 You will need a balance board for this exercise. Ask your health care provider where you can get a balance board or how you can make one. 1. Stand on a non-carpeted surface near a countertop or wall. 2. Step onto the balance board so your feet are hip-width apart. 3. Keep your feet in place and keep your upper body and hips steady. Using only your feet and ankles, tip the board from side to side, alternating between tipping to the left and to the right. Do not let the board hit the floor at all. Repeat __________ times, pausing from time to time to hold a steady position. Complete this exercise __________ times a day. Exercise H: Plantar flexion/dorsiflexion 1 You will need a balance board for this exercise. Ask your health care provider where you can get a balance board or how you can make one. 1. Stand on a non-carpeted surface near a countertop or wall. 2. Step onto the balance board so your feet are hip-width apart. 3. Keep your feet in place and keep your upper body and hips steady. Using only your feet and ankles, tip the board forward and backward so the board silently taps the floor. Do not let the board forcefully hit the floor. Repeat __________ times, pausing from time to time to hold a steady position. Complete this exercise __________ times a day. Exercise I: Plantar flexion/dorsiflexion 2 You will need a balance board for this exercise. Ask your health care provider where you can get a balance board or how you can make one. 1. Stand on a non-carpeted surface near a countertop or wall. 2. Step onto the balance  board so your feet are hip-width apart. 3. Keep your feet in place and keep your upper body and hips steady. Using only your feet and ankles, tip the board forward and back so the board does not hit the floor at all. Repeat __________ times, pausing from time to time to hold a steady position. Complete this exercise __________ times a day. This information is not intended to replace advice given to you by your health care provider. Make sure you discuss any questions you have with your health care provider. Document Released: 01/17/2005 Document Revised: 02/23/2016 Document Reviewed: 05/06/2015 Elsevier Interactive Patient Education  2017 [employer].

## 2016-06-01 ENCOUNTER — Ambulatory Visit (INDEPENDENT_AMBULATORY_CARE_PROVIDER_SITE_OTHER): Payer: Worker's Compensation | Admitting: Physician Assistant

## 2016-06-01 ENCOUNTER — Telehealth: Payer: Self-pay

## 2016-06-01 VITALS — BP 122/72 | HR 85 | Temp 98.4°F | Resp 17 | Ht 61.5 in | Wt 127.0 lb

## 2016-06-01 DIAGNOSIS — S93492D Sprain of other ligament of left ankle, subsequent encounter: Secondary | ICD-10-CM | POA: Diagnosis not present

## 2016-06-01 DIAGNOSIS — S9032XD Contusion of left foot, subsequent encounter: Secondary | ICD-10-CM

## 2016-06-01 DIAGNOSIS — Y990 Civilian activity done for income or pay: Secondary | ICD-10-CM

## 2016-06-01 NOTE — Telephone Encounter (Signed)
[name] is calling on behalf of Sharon Bowman regarding her recent workers comp visit the pt said she did not need to follow up with the clinic but patricia read in the providers notes to return for follow up after two weeks of physical therapy. If anything changes and she does not need that follow up [name] would like to receive a phone call   Please advise  [phone] (patricia)

## 2016-06-01 NOTE — Telephone Encounter (Signed)
Any follow up with you needed?

## 2016-06-01 NOTE — Progress Notes (Signed)
   Sharon Bowman  MRN: [id number] DOB: 19-Apr-1976  Subjective:  Pt presents to clinic for recheck of her left ankle.  She feels like her ankle is much better though not resolved.  She mainly has pain where she was stepped on.  She goes to PT every other week for her health and they have worked on her ankle and it has helped some.  She is wearing the sweedo brace all the time.  She is ready to go back to work.  She has been using some tylenol and voltaren gel.  Review of Systems  Musculoskeletal: Positive for joint swelling (typical for her). Negative for gait problem.   Pt's currently medical problems and her medication and allergy lost were reviewed and updated.  Pt patients past, family and social history were reviewed and updated.   Objective:  BP 122/72 (BP Location: Right Arm, Patient Position: Sitting, Cuff Size: Normal)   Pulse 85   Temp 98.4 F (36.9 C) (Oral)   Resp 17   Ht 5' 1.5" (1.562 m)   Wt 127 lb (57.6 kg)   LMP 11/01/2014 (Approximate)   SpO2 99%   BMI 23.61 kg/m   Physical Exam  Constitutional: She is oriented to person, place, and time and well-developed, well-nourished, and in no distress.  HENT:  Head: Normocephalic and atraumatic.  Right Ear: Hearing and external ear normal.  Left Ear: Hearing and external ear normal.  Eyes: Conjunctivae are normal.  Neck: Normal range of motion.  Pulmonary/Chest: Effort normal.  Musculoskeletal:       Left foot: There is tenderness (mild in mid  lateral foot and anterior lateral ankle). There is normal range of motion.       Feet:  Neurological: She is alert and oriented to person, place, and time. Gait normal.  Skin: Skin is warm and dry.  Psychiatric: Mood, memory, affect and judgment normal.  Vitals reviewed.   Assessment and Plan :  Work related injury  Sprain of anterior talofibular ligament of left ankle, subsequent encounter - Plan: Ambulatory referral to Physical Therapy  Contusion of left foot,  subsequent encounter - Plan: Ambulatory referral to Physical Therapy   Continue Sweedo - ice and elevate - start PT specifically for her ankle - this will improve her healing speed - she will recheck with me after 2 weeks of PT  Windell Hummingbird PA-C  [facility] 06/01/2016 11:41 AM

## 2016-06-01 NOTE — Patient Instructions (Addendum)
  Will set up PT we will see you after you have been in PT for 2 weeks  Continue ice and elevation   IF you received an x-ray today, you will receive an invoice from [facility]. Please contact [facility] at [phone] with questions or concerns regarding your invoice.   IF you received labwork today, you will receive an invoice from [employer]. Please contact [facility] at [phone] with questions or concerns regarding your invoice.   Our billing staff will not be able to assist you with questions regarding bills from these companies.  You will be contacted with the lab results as soon as they are available. The fastest way to get your results is to activate your My Chart account. Instructions are located on the last page of this paperwork. If you have not heard from [place] regarding the results in 2 weeks, please contact this office.

## 2016-06-02 NOTE — Telephone Encounter (Signed)
I wanted her to f/u after 2 weeks of PT

## 2016-06-02 NOTE — Telephone Encounter (Signed)
Called the number taken for Sharon Bowman and left VM explaining a 2week f/u post PT

## 2016-06-15 ENCOUNTER — Ambulatory Visit: Payer: BLUE CROSS/BLUE SHIELD

## 2016-07-11 ENCOUNTER — Telehealth: Payer: Self-pay

## 2016-07-11 DIAGNOSIS — M25572 Pain in left ankle and joints of left foot: Secondary | ICD-10-CM

## 2016-07-11 NOTE — Telephone Encounter (Signed)
[facility] called to request more visits for this patient at their facility. As it is workers comp, the [employer] is requiring a new referral to be placed in order for her to continue treatment. Original referral was placed 06/01/16. Please place if appropriate. Thank you!

## 2016-07-13 ENCOUNTER — Ambulatory Visit (INDEPENDENT_AMBULATORY_CARE_PROVIDER_SITE_OTHER): Payer: Worker's Compensation | Admitting: Physician Assistant

## 2016-07-13 VITALS — BP 122/76 | HR 87 | Temp 98.2°F | Resp 17 | Ht 61.5 in | Wt 128.0 lb

## 2016-07-13 DIAGNOSIS — Y990 Civilian activity done for income or pay: Secondary | ICD-10-CM

## 2016-07-13 DIAGNOSIS — S93492D Sprain of other ligament of left ankle, subsequent encounter: Secondary | ICD-10-CM | POA: Diagnosis not present

## 2016-07-13 DIAGNOSIS — M25572 Pain in left ankle and joints of left foot: Secondary | ICD-10-CM | POA: Diagnosis not present

## 2016-07-13 NOTE — Patient Instructions (Signed)
     IF you received an x-ray today, you will receive an invoice from [facility]. Please contact [facility] at [phone] with questions or concerns regarding your invoice.   IF you received labwork today, you will receive an invoice from [facility]. Please contact [facility] at [phone] with questions or concerns regarding your invoice.   Our billing staff will not be able to assist you with questions regarding bills from these companies.  You will be contacted with the lab results as soon as they are available. The fastest way to get your results is to activate your My Chart account. Instructions are located on the last page of this paperwork. If you have not heard from us regarding the results in 2 weeks, please contact this office.     

## 2016-07-13 NOTE — Progress Notes (Signed)
MRN: [id number] DOB: 1975-09-22  Subjective:  Pt presents to clinic with an injury that occurred at work on 04/29/2016.  She has gotten 50% better and really feels like PT is helping her and that has been continued.  She notices that pain only when she is up moving around and only on the outside of the ankle. She knows that she takes a long time to recover from injuries - she has been working and been ok.  Review of Systems  Musculoskeletal: Positive for myalgias.    Patients medications, problem list and allergies reviewed. Patients social, past and surgical history is reviewed.   Objective:  BP 122/76 (BP Location: Right Arm, Patient Position: Sitting, Cuff Size: Normal)   Pulse 87   Temp 98.2 F (36.8 C) (Oral)   Resp 17   Ht 5' 1.5" (1.562 m)   Wt 128 lb (58.1 kg)   LMP 11/01/2014 (Approximate)   SpO2 100%   BMI 23.79 kg/m   Physical Exam  Constitutional: She is oriented to person, place, and time and well-developed, well-nourished, and in no distress.  HENT:  Head: Normocephalic and atraumatic.  Right Ear: Hearing and external ear normal.  Left Ear: Hearing and external ear normal.  Eyes: Conjunctivae are normal.  Neck: Normal range of motion.  Pulmonary/Chest: Effort normal.  Musculoskeletal:       Left ankle: She exhibits normal range of motion and no swelling. Tenderness. Posterior TFL tenderness found.       Feet:  Neurological: She is alert and oriented to person, place, and time. Gait normal.  Skin: Skin is warm and dry.  Psychiatric: Mood, memory, affect and judgment normal.  Vitals reviewed.   Assessment and Plan :  Work related injury - Plan: Care order/instruction:  Acute left ankle pain  Sprain of anterior talofibular ligament of left ankle, subsequent encounter   Continue PT for now - pt will f/u when she is released from PT - she will continue working as she has tolerated in the past several weeks.   Windell Hummingbird PA-C  [facility] 07/15/2016 9:46 AM

## 2016-07-18 ENCOUNTER — Other Ambulatory Visit: Payer: Self-pay | Admitting: Medical

## 2016-07-19 NOTE — Telephone Encounter (Signed)
Is this okay to refill? 

## 2016-07-23 ENCOUNTER — Telehealth: Payer: Self-pay

## 2016-07-23 NOTE — Telephone Encounter (Signed)
alecia from [facility] needing more authorization for pt workers comp referral she was only authorized for one visit but before had 6 so needs an updated referral for 6 visits     Please advise  [phone]

## 2016-07-23 NOTE — Telephone Encounter (Signed)
Faxing W/C adjustor's information over to [facility]

## 2016-08-02 ENCOUNTER — Ambulatory Visit (INDEPENDENT_AMBULATORY_CARE_PROVIDER_SITE_OTHER): Payer: BLUE CROSS/BLUE SHIELD | Admitting: Medical

## 2016-08-02 ENCOUNTER — Encounter: Payer: Self-pay | Admitting: Medical

## 2016-08-02 VITALS — BP 110/60 | HR 79 | Resp 16 | Wt 127.2 lb

## 2016-08-02 DIAGNOSIS — Z90710 Acquired absence of both cervix and uterus: Secondary | ICD-10-CM

## 2016-08-02 DIAGNOSIS — M797 Fibromyalgia: Secondary | ICD-10-CM | POA: Diagnosis not present

## 2016-08-02 DIAGNOSIS — G4700 Insomnia, unspecified: Secondary | ICD-10-CM | POA: Diagnosis not present

## 2016-08-02 DIAGNOSIS — J45909 Unspecified asthma, uncomplicated: Secondary | ICD-10-CM | POA: Diagnosis not present

## 2016-08-02 DIAGNOSIS — N3010 Interstitial cystitis (chronic) without hematuria: Secondary | ICD-10-CM | POA: Diagnosis not present

## 2016-08-02 DIAGNOSIS — F418 Other specified anxiety disorders: Secondary | ICD-10-CM | POA: Diagnosis not present

## 2016-08-02 DIAGNOSIS — Q613 Polycystic kidney, unspecified: Secondary | ICD-10-CM

## 2016-08-02 DIAGNOSIS — F329 Major depressive disorder, single episode, unspecified: Secondary | ICD-10-CM

## 2016-08-02 DIAGNOSIS — E282 Polycystic ovarian syndrome: Secondary | ICD-10-CM | POA: Diagnosis not present

## 2016-08-02 DIAGNOSIS — Z8262 Family history of osteoporosis: Secondary | ICD-10-CM

## 2016-08-02 DIAGNOSIS — F419 Anxiety disorder, unspecified: Secondary | ICD-10-CM

## 2016-08-02 MED ORDER — DULOXETINE HCL 30 MG PO CPEP: ORAL_CAPSULE | ORAL | 3 refills | Status: DC

## 2016-08-02 MED ORDER — DULOXETINE HCL 60 MG PO CPEP: 60.0000 mg | ORAL_CAPSULE | Freq: Every day | ORAL | 3 refills | Status: DC

## 2016-08-02 MED ORDER — AMITRIPTYLINE HCL 50 MG PO TABS: 50.0000 mg | ORAL_TABLET | Freq: Every day | ORAL | 3 refills | Status: DC

## 2016-08-02 MED ORDER — CYCLOBENZAPRINE HCL 10 MG PO TABS: ORAL_TABLET | ORAL | 1 refills | Status: DC

## 2016-08-02 NOTE — Progress Notes (Signed)
Subjective:   HPI Chief Complaint  Patient presents with  . Medication Management    no concerns. fasting.      Sharon Bowman is a 41 y.o. female who presents for a complete physical.  Medical care team includes:  Dr. Aloha Gell, gynecology  Dr. Estanislado Pandy, rheumatology  Dr. Marval Regal, nephrology  Dr. Amalia Hailey, urology   Dr. Merideth Abbey eye doctor Glade Lloyd, DAVID Audelia Acton, PA-C here for primary care   Here for routine f/u/med check.   Working part time at [employer].  Compliant with medications.     Up to date on gyn care.  Sees dentist and eye doctor regularly.   fibromyaliga - is kind of use to her pain.   Hurts daily in different spots daily, ankles and feet hurt all the time.    Sees physical therapy every 2 weeks.  Has dry needling.   Ankles always a little swollen.  Hurts mostly in tops and sides of her feet.  Exercises some with walking, calisthenics, stretches daily.  Taking Elavil 50mg  QHS.  Cymbalta 30mg  in the morning, 60mg  QHS daily.  Uses Flexeril 10mg  nightly.   Takes OTC Vit D. Takes iron OTC once daily.   Takes Aspirin 81mg  daily.  No hx/o bone density scan?  Hysterectomy roughly 2 years ago.  Mom and MGM both have osteoporosis.       Reviewed their medical, surgical, family, social, medication, and allergy history and updated chart as appropriate.  Past Medical History:  Diagnosis Date  . Allergic asthma 08/2015  . Anemia   . Anxiety   . Arthritis    osetoarthritis  . Chronic sinusitis   . Depression   . DVT (deep venous thrombosis) ([place]) 04/2014   right leg - behind calf, no treated with aspirin daily  . Endometriosis   . Family history of premature CAD   . Fibromyalgia 2017  . Former smoker   . GAD (generalized anxiety disorder)   . Headache   . History of PFTs 08/2015   normal  . Interstitial cystitis   . PCOS (polycystic ovarian syndrome)   . Polycystic kidney disease     Past Surgical History:  Procedure  Laterality Date  . BLADDER SURGERY     BLADDER STRETCH X 3  . carpel radial tunnel  2014  . carpel tunnel     left  and right hand   . CYSTO WITH HYDRODISTENSION N/A 11/11/2014   Procedure: CYSTOSCOPY/HYDRODISTENSION MARCAINE AND PYRIDIUM AND Esmeralda Links;  Surgeon: Carolan Clines, MD;  Location: [facility];  Service: Urology;  Laterality: N/A;  . CYSTOSCOPY N/A 11/11/2014   Procedure: CYSTOSCOPY;  Surgeon: Aloha Gell, MD;  Location: [facility];  Service: Gynecology;  Laterality: N/A;  . CYSTOSCOPY WITH RETROGRADE PYELOGRAM, URETEROSCOPY AND STENT PLACEMENT Bilateral 11/11/2014   Procedure:  RETROGRADE PYELOGRAM with BILATERAL URETERAL CATHETHERS;  Surgeon: Carolan Clines, MD;  Location: [facility];  Service: Urology;  Laterality: Bilateral;  . IUD REMOVAL N/A 11/11/2014   Procedure: INTRAUTERINE DEVICE (IUD) REMOVAL;  Surgeon: Aloha Gell, MD;  Location: [facility];  Service: Gynecology;  Laterality: N/A;  . LAPAROSCOPY     X 2 ENDOMETRIOSIS.  Marland Kitchen LEG SURGERY  04/2014   right knee - tumor - benighn  . 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 ROBOTIC ASSISTED LAPAROSCOPIC LYSIS OF ADHESION N/A 11/11/2014   Procedure: ROBOTIC ASSISTED LAPAROSCOPIC LYSIS OF ADHESION;  Surgeon: Aloha Gell, MD;  Location: [facility];  Service: Gynecology;  Laterality: N/A;  . ROBOTIC ASSISTED TOTAL HYSTERECTOMY WITH BILATERAL SALPINGO OOPHERECTOMY Bilateral 11/11/2014   Procedure: ROBOTIC ASSISTED TOTAL HYSTERECTOMY WITH BILATERAL SALPINGO OOPHORECTOMY;  Surgeon: Aloha Gell, MD;  Location: [facility];  Service: Gynecology;  Laterality: Bilateral;  . TOOTH EXTRACTION      Social History   Social History  . Marital status: Single    Spouse name: N/A  . Number of children: N/A  . Years of education: N/A   Occupational History  . Not on file.   Social History Main Topics  . Smoking status: Former Smoker    Packs/day: 1.00    Years: 18.00    Types: Cigarettes    Quit date: 07/02/2014  .  Smokeless tobacco: Never Used  . Alcohol use No  . Drug use: Yes    Types: Marijuana     Comment: marijuana use  - last use 2 yrs ago  . Sexual activity: No     Comment: Mirena   Other Topics Concern  . Not on file   Social History Narrative  . No narrative on file    Family History  Problem Relation Age of Onset  . Diabetes Mother   . Macular degeneration Mother   . Hyperlipidemia Mother   . Hypertension Mother   . Polycystic kidney disease Father   . Heart disease Father 69    MI, CABG  . Diabetes Father   . Hepatitis Father     C  . Hypothyroidism Father   . Hypertension Father   . Cancer Maternal Grandfather     bone  . Heart disease Paternal Grandmother   . Heart disease Paternal Grandfather      Current Outpatient Prescriptions:  .  acetaminophen (TYLENOL) 500 MG tablet, Take 500 mg by mouth every 6 (six) hours as needed for moderate pain. Reported on 09/29/2015, Disp: , Rfl:  .  amitriptyline (ELAVIL) 50 MG tablet, Take 1 tablet (50 mg total) by mouth at bedtime., Disp: 90 tablet, Rfl: 3 .  aspirin 81 MG tablet, Take 81 mg by mouth daily., Disp: , Rfl:  .  aspirin-acetaminophen-caffeine (EXCEDRIN MIGRAINE) 250-250-65 MG per tablet, Take 1 tablet by mouth every 6 (six) hours as needed for headache or migraine. Reported on 09/29/2015, Disp: , Rfl:  .  Bupivacaine HCl (MARCAINE IJ), Inject as directed. Reported on 12/22/2015, Disp: , Rfl:  .  calcium-vitamin D (OSCAL WITH D) 500-200 MG-UNIT per tablet, Take 1 tablet by mouth daily.  , Disp: , Rfl:  .  Cyanocobalamin (B-12) 50 MCG TABS, Take 50 mcg by mouth daily. , Disp: , Rfl:  .  cyclobenzaprine (FLEXERIL) 10 MG tablet, TAKE 1 TABLET (10 MG TOTAL) BY MOUTH AT BEDTIME., Disp: 90 tablet, Rfl: 1 .  diclofenac sodium (VOLTAREN) 1 % GEL, Apply topically 4 (four) times daily., Disp: , Rfl:  .  DULoxetine (CYMBALTA) 30 MG capsule, 1 tablet po daily in the morning, Disp: 90 capsule, Rfl: 3 .  DULoxetine (CYMBALTA) 60 MG  capsule, Take 1 capsule (60 mg total) by mouth at bedtime., Disp: 90 capsule, Rfl: 3 .  estradiol (CLIMARA - DOSED IN MG/24 HR) 0.1 mg/24hr patch, Place 0.1 mg onto the skin once a week., Disp: , Rfl:  .  Iron-Vitamin C (FE C PO), Take 1 tablet by mouth daily. , Disp: , Rfl:  .  Multiple Vitamin (MULTI-DAY VITAMINS PO), Take 1 tablet by mouth daily.  , Disp: , Rfl:  .  traMADol (ULTRAM) 50 MG tablet, Take  by mouth every 6 (six) hours as needed. Reported on 09/29/2015, Disp: , Rfl:   Allergies  Allergen Reactions  . Latex Itching and Rash    Review of Systems Constitutional: -fever, -chills, -sweats, -unexpected weight change, -decreased appetite, +fatigue Allergy: -sneezing, -itching, -congestion Dermatology: -changing moles, --rash, -lumps ENT: -runny nose, -ear pain, -sore throat, -hoarseness, -sinus pain, -teeth pain, - ringing in ears, -hearing loss, -nosebleeds Cardiology: -chest pain, -palpitations, -swelling, -difficulty breathing when lying flat, -waking up short of breath Respiratory: -cough, -shortness of breath, -difficulty breathing with exercise or exertion, -wheezing, -coughing up blood Gastroenterology: -abdominal pain, -nausea, -vomiting, -diarrhea, -constipation, -blood in stool, -changes in bowel movement, -difficulty swallowing or eating Hematology: -bleeding, -bruising  Musculoskeletal: +joint aches, +muscle aches, -joint swelling, -back pain, -neck pain, -cramping, -changes in gait Ophthalmology: denies vision changes, eye redness, itching, discharge Urology: -burning with urination, -difficulty urinating, -blood in urine, -urinary frequency, -urgency, -incontinence Neurology: +headache, -weakness, -tingling, -numbness, -memory loss, -falls, -dizziness Psychology: +depressed mood, -agitation, +sleep problems   Objective:   Physical Exam  BP 110/60   Pulse 79   Resp 16   Wt 127 lb 3.2 oz (57.7 kg)   LMP 11/01/2014 (Approximate) Comment: Mirena IUD  SpO2 93%    BMI 23.65 kg/m   Wt Readings from Last 3 Encounters:  08/02/16 127 lb 3.2 oz (57.7 kg)  07/13/16 128 lb (58.1 kg)  06/01/16 127 lb (57.6 kg)   General appearance: alert, no distress, WD/WN, white female Neck: supple, no lymphadenopathy, no thyromegaly, no masses, normal ROM, no bruits Chest: non tender, normal shape and expansion Heart: RRR, normal S1, S2, no murmurs Lungs: CTA bilaterally, no wheezes, rhonchi, or rales Extremities: no edema, no cyanosis, no clubbing Pulses: 2+ symmetric, upper and lower extremities, normal cap refill Neurological: alert, oriented x 3, CN2-12 intact, strength normal upper extremities and lower extremities, sensation normal throughout, DTRs 2+ throughout, no cerebellar signs, gait normal Psychiatric: normal affect, behavior normal, pleasant     Assessment and Plan :    Encounter Diagnoses  Name Primary?  . Fibromyalgia Yes  . Extrinsic asthma without complication, unspecified asthma severity, unspecified whether persistent   . PCOS (polycystic ovarian syndrome)   . IC (interstitial cystitis)   . Polycystic kidney disease   . Insomnia, unspecified type   . S/P hysterectomy   . Anxiety and depression   . Family history of osteoporosis     Overall stable on current medicatoins.  She continues with physical therapy, has routine f/u with her specialist, and no new issues today.  Discussed risks for osteoporosis - family hx/o osteoporosis, weight less than 127lb, vit D deficiency, Caucasian female, kidney disease, long term use of high risk medication, surgical menopause, hysterectomy age 57yo.    Reviewed labs and nephrology records from 05/2016  F/u 73mo  Shanette was seen today for medication management.  Diagnoses and all orders for this visit:  Fibromyalgia  Extrinsic asthma without complication, unspecified asthma severity, unspecified whether persistent  PCOS (polycystic ovarian syndrome)  IC (interstitial cystitis)  Polycystic  kidney disease  Insomnia, unspecified type  S/P hysterectomy  Anxiety and depression  Family history of osteoporosis  Other orders -     DULoxetine (CYMBALTA) 60 MG capsule; Take 1 capsule (60 mg total) by mouth at bedtime. -     DULoxetine (CYMBALTA) 30 MG capsule; 1 tablet po daily in the morning -     cyclobenzaprine (FLEXERIL) 10 MG tablet; TAKE 1 TABLET (10 MG TOTAL) BY MOUTH  AT BEDTIME. -     amitriptyline (ELAVIL) 50 MG tablet; Take 1 tablet (50 mg total) by mouth at bedtime.

## 2016-08-06 ENCOUNTER — Encounter: Payer: Self-pay | Admitting: Medical

## 2016-08-06 ENCOUNTER — Ambulatory Visit (INDEPENDENT_AMBULATORY_CARE_PROVIDER_SITE_OTHER): Payer: BLUE CROSS/BLUE SHIELD | Admitting: Medical

## 2016-08-06 VITALS — BP 110/70 | HR 79 | Temp 98.1°F

## 2016-08-06 DIAGNOSIS — Z8262 Family history of osteoporosis: Secondary | ICD-10-CM

## 2016-08-06 DIAGNOSIS — E559 Vitamin D deficiency, unspecified: Secondary | ICD-10-CM | POA: Insufficient documentation

## 2016-08-06 DIAGNOSIS — E282 Polycystic ovarian syndrome: Secondary | ICD-10-CM

## 2016-08-06 DIAGNOSIS — N3010 Interstitial cystitis (chronic) without hematuria: Secondary | ICD-10-CM | POA: Diagnosis not present

## 2016-08-06 DIAGNOSIS — Q613 Polycystic kidney, unspecified: Secondary | ICD-10-CM | POA: Diagnosis not present

## 2016-08-06 DIAGNOSIS — R059 Cough, unspecified: Secondary | ICD-10-CM | POA: Insufficient documentation

## 2016-08-06 DIAGNOSIS — E8940 Asymptomatic postprocedural ovarian failure: Secondary | ICD-10-CM | POA: Diagnosis not present

## 2016-08-06 DIAGNOSIS — R0789 Other chest pain: Secondary | ICD-10-CM | POA: Diagnosis not present

## 2016-08-06 DIAGNOSIS — R05 Cough: Secondary | ICD-10-CM

## 2016-08-06 MED ORDER — DEXLANSOPRAZOLE 60 MG PO CPDR: 60.0000 mg | DELAYED_RELEASE_CAPSULE | Freq: Every day | ORAL | 0 refills | Status: DC

## 2016-08-06 NOTE — Progress Notes (Signed)
Subjective: Chief Complaint  Patient presents with  . dry coughing, burning in throat    dry coughing, burning in throat    Here 1 day hx/o awakening this morning with burning in chest, dry cough.  No fever, no chills, no sore throat, no ear pain, no headache.   Some belching, but not out of ordinary.   Started drinking orange juice in the past week, a lot of it.  Goes through phases.  Had a chocolate milk phase at night for a while.  No recent alcohol or other GERD triggers foods.   Does eat or drink within 2 hours of bedtime.    Discussed risks for osteoporosis - family hx/o osteoporosis, weight less than 127lb, vit D deficiency, Caucasian female, kidney disease, long term use of high risk medication, surgical menopause, hysterectomy age 63yo.    Past Medical History:  Diagnosis Date  . Allergic asthma 08/2015  . Anemia   . Anxiety   . Arthritis    osetoarthritis  . Chronic sinusitis   . Depression   . DVT (deep venous thrombosis) ([place]) 04/2014   right leg - behind calf, no treated with aspirin daily  . Endometriosis   . Family history of premature CAD   . Fibromyalgia 2017  . Former smoker   . GAD (generalized anxiety disorder)   . Headache   . History of PFTs 08/2015   normal  . Interstitial cystitis   . PCOS (polycystic ovarian syndrome)   . Polycystic kidney disease    Current Outpatient Prescriptions on File Prior to Visit  Medication Sig Dispense Refill  . acetaminophen (TYLENOL) 500 MG tablet Take 500 mg by mouth every 6 (six) hours as needed for moderate pain. Reported on 09/29/2015    . amitriptyline (ELAVIL) 50 MG tablet Take 1 tablet (50 mg total) by mouth at bedtime. 90 tablet 3  . aspirin 81 MG tablet Take 81 mg by mouth daily.    Marland Kitchen aspirin-acetaminophen-caffeine (EXCEDRIN MIGRAINE) 250-250-65 MG per tablet Take 1 tablet by mouth every 6 (six) hours as needed for headache or migraine. Reported on 09/29/2015    . Bupivacaine HCl (MARCAINE IJ) Inject as directed.  Reported on 12/22/2015    . calcium-vitamin D (OSCAL WITH D) 500-200 MG-UNIT per tablet Take 1 tablet by mouth daily.      . Cyanocobalamin (B-12) 50 MCG TABS Take 50 mcg by mouth daily.     . cyclobenzaprine (FLEXERIL) 10 MG tablet TAKE 1 TABLET (10 MG TOTAL) BY MOUTH AT BEDTIME. 90 tablet 1  . diclofenac sodium (VOLTAREN) 1 % GEL Apply topically 4 (four) times daily.    . DULoxetine (CYMBALTA) 30 MG capsule 1 tablet po daily in the morning 90 capsule 3  . DULoxetine (CYMBALTA) 60 MG capsule Take 1 capsule (60 mg total) by mouth at bedtime. 90 capsule 3  . estradiol (CLIMARA - DOSED IN MG/24 HR) 0.1 mg/24hr patch Place 0.1 mg onto the skin once a week.    . Iron-Vitamin C (FE C PO) Take 1 tablet by mouth daily.     . Multiple Vitamin (MULTI-DAY VITAMINS PO) Take 1 tablet by mouth daily.      . traMADol (ULTRAM) 50 MG tablet Take by mouth every 6 (six) hours as needed. Reported on 09/29/2015     No current facility-administered medications on file prior to visit.    ROS as in subjective  Objective: BP 110/70   Pulse 79   Temp 98.1 F (36.7 C)  LMP 11/01/2014 (Approximate) Comment: Mirena IUD  SpO2 99%   General appearance: alert, no distress, WD/WN,  HEENT: normocephalic, sclerae anicteric, TMs pearly, nares patent, no discharge or erythema, pharynx normal Oral cavity: MMM, no lesions Neck: supple, no lymphadenopathy, no thyromegaly, no masses Heart: RRR, normal S1, S2, no murmurs Lungs: CTA bilaterally, no wheezes, rhonchi, or rales Abdomen: +bs, soft, mild right lower quadrant tenderness, otherwise non tender, non distended, no masses, no hepatomegaly, no splenomegaly Pulses: 2+ symmetric, upper and lower extremities, normal cap refill    Assessment: Encounter Diagnoses  Name Primary?  . Cough Yes  . Burning in the chest   . Vitamin D deficiency   . PCOS (polycystic ovarian syndrome)   . IC (interstitial cystitis)   . Polycystic kidney disease   . Surgical menopause   .  Family history of osteoporosis     Plan: Discussed her new symptoms x 1 day.  Likely GERD but could be early URI symptoms.   Begin samples of Dexilant, GERD trigger avoidance, avoid eating within 2 hours of bedtime.  If symptoms significantly change in the next few days, then call back.   Will use watch and wait approach.    She will check insurance coverage for bone density scan

## 2016-08-06 NOTE — Patient Instructions (Signed)
Recommendations:  Begin samples of Dexilant 60mg  daily until you run out, then change to Dexilant 30mg  daily til you run out  Avoid GERD trigger foods for now  Avoid eating or drinking within 2 hours of bedtime  Call if symptoms changing significantly in the next few days   Gastroesophageal Reflux Disease, Adult Gastroesophageal reflux disease (GERD) happens when acid from your stomach flows up into the esophagus. When acid comes in contact with the esophagus, the acid causes soreness (inflammation) in the esophagus. Over time, GERD may create small holes (ulcers) in the lining of the esophagus. CAUSES   Increased body weight. This puts pressure on the stomach, making acid rise from the stomach into the esophagus.   Smoking. This increases acid production in the stomach.   Drinking alcohol. This causes decreased pressure in the lower esophageal sphincter (valve or ring of muscle between the esophagus and stomach), allowing acid from the stomach into the esophagus.   Late evening meals and a full stomach. This increases pressure and acid production in the stomach.   A malformed lower esophageal sphincter.  Sometimes, no cause is found. SYMPTOMS   Burning pain in the lower part of the mid-chest behind the breastbone and in the mid-stomach area. This may occur twice a week or more often.   Trouble swallowing.   Sore throat.   Dry cough.   Asthma-like symptoms including chest tightness, shortness of breath, or wheezing.  DIAGNOSIS  Your caregiver may be able to diagnose GERD based on your symptoms. In some cases, X-rays and other tests may be done to check for complications or to check the condition of your stomach and esophagus. TREATMENT  Your caregiver may recommend over-the-counter or prescription medicines to help decrease acid production. Ask your caregiver before starting or adding any new medicines.  HOME CARE INSTRUCTIONS   Change the factors that you can control. Ask  your caregiver for guidance concerning weight loss, quitting smoking, and alcohol consumption.   Avoid foods and drinks that make your symptoms worse, such as:   Caffeine or alcoholic drinks.   Chocolate.   Peppermint or mint flavorings.   Garlic and onions.   Spicy foods.   Citrus fruits, such as oranges, lemons, or limes.   Tomato-based foods such as sauce, chili, salsa, and pizza.   Fried and fatty foods.   Avoid lying down for the 3 hours prior to your bedtime or prior to taking a nap.   Eat small, frequent meals instead of large meals.   Wear loose-fitting clothing. Do not wear anything tight around your waist that causes pressure on your stomach.   Raise the head of your bed 6 to 8 inches with wood blocks to help you sleep. Extra pillows will not help.   Only take over-the-counter or prescription medicines for pain, discomfort, or fever as directed by your caregiver.   Do not take aspirin, ibuprofen, or other nonsteroidal anti-inflammatory drugs (NSAIDs).  SEEK IMMEDIATE MEDICAL CARE IF:   You have pain in your arms, neck, jaw, teeth, or back.   Your pain increases or changes in intensity or duration.   You develop nausea, vomiting, or sweating (diaphoresis).   You develop shortness of breath, or you faint.   Your vomit is green, yellow, black, or looks like coffee grounds or blood.   Your stool is red, bloody, or black.  These symptoms could be signs of other problems, such as heart disease, gastric bleeding, or esophageal bleeding. MAKE SURE YOU:  Understand these instructions.   Will watch your condition.   Will get help right away if you are not doing well or get worse.  Document Released: 03/28/2005 Document Revised: 02/28/2011 Document Reviewed: 01/05/2011 [facility] Patient Information 2012 [place].

## 2016-08-07 ENCOUNTER — Other Ambulatory Visit: Payer: Self-pay | Admitting: Medical

## 2016-08-07 ENCOUNTER — Telehealth: Payer: Self-pay | Admitting: Medical

## 2016-08-07 MED ORDER — PROMETHAZINE-DM 6.25-15 MG/5ML PO SYRP: 5.0000 mL | ORAL_SOLUTION | Freq: Four times a day (QID) | ORAL | 0 refills | Status: DC | PRN

## 2016-08-07 NOTE — Telephone Encounter (Signed)
I sent Promethazine DM cough syrup to pharmacy, have her use Mucinex OTC to see if this can help.  C/t the reflux medication.  Let see how this regimen works.  Also on a separate note - have her check insurance coverage for bone density scan, and refer to pharmquest for migraine study to see if she is eligible.

## 2016-08-07 NOTE — Telephone Encounter (Signed)
Pt says she is coughing up mucus now. She is not sure if the mucus has a color because she has been constantly sucking on red cough drops so the mucus is thick and red. Pt requesting med.

## 2016-08-08 NOTE — Telephone Encounter (Signed)
Called and l/m about this on her voicemail. And sent referral to pharmquest

## 2016-08-10 ENCOUNTER — Ambulatory Visit (INDEPENDENT_AMBULATORY_CARE_PROVIDER_SITE_OTHER): Payer: BLUE CROSS/BLUE SHIELD | Admitting: Medical

## 2016-08-10 VITALS — BP 96/68 | HR 77 | Temp 98.4°F | Wt 127.0 lb

## 2016-08-10 DIAGNOSIS — R05 Cough: Secondary | ICD-10-CM | POA: Diagnosis not present

## 2016-08-10 DIAGNOSIS — J0100 Acute maxillary sinusitis, unspecified: Secondary | ICD-10-CM

## 2016-08-10 DIAGNOSIS — R6889 Other general symptoms and signs: Secondary | ICD-10-CM | POA: Diagnosis not present

## 2016-08-10 DIAGNOSIS — R059 Cough, unspecified: Secondary | ICD-10-CM

## 2016-08-10 LAB — POC INFLUENZA A&B (BINAX/QUICKVUE)
INFLUENZA A, POC: NEGATIVE
INFLUENZA B, POC: NEGATIVE

## 2016-08-10 MED ORDER — AMOXICILLIN 500 MG PO TABS: ORAL_TABLET | ORAL | 0 refills | Status: DC

## 2016-08-10 NOTE — Progress Notes (Signed)
Subjective:  Sharon Bowman is a 41 y.o. female who presents for possible sinus infection.  I saw her 4 days ago for cough, burning in chest, but as the week has continued, has less cough, less burning in chest, but more sinus pressure headache, nausea, drainage.   No chills, no fever, no rash, no ear pain, no sore throat.   Does have niece as flu contact.  Using cough medication I prescribed at last visit. No other aggravating or relieving factors.  No other c/o.  Past Medical History:  Diagnosis Date  . Allergic asthma 08/2015  . Anemia   . Anxiety   . Arthritis    osetoarthritis  . Chronic sinusitis   . Depression   . DVT (deep venous thrombosis) ([place]) 04/2014   right leg - behind calf, no treated with aspirin daily  . Endometriosis   . Family history of premature CAD   . Fibromyalgia 2017  . Former smoker   . GAD (generalized anxiety disorder)   . Headache   . History of PFTs 08/2015   normal  . Interstitial cystitis   . PCOS (polycystic ovarian syndrome)   . Polycystic kidney disease    Current Outpatient Prescriptions on File Prior to Visit  Medication Sig Dispense Refill  . acetaminophen (TYLENOL) 500 MG tablet Take 500 mg by mouth every 6 (six) hours as needed for moderate pain. Reported on 09/29/2015    . amitriptyline (ELAVIL) 50 MG tablet Take 1 tablet (50 mg total) by mouth at bedtime. 90 tablet 3  . aspirin 81 MG tablet Take 81 mg by mouth daily.    Marland Kitchen aspirin-acetaminophen-caffeine (EXCEDRIN MIGRAINE) 250-250-65 MG per tablet Take 1 tablet by mouth every 6 (six) hours as needed for headache or migraine. Reported on 09/29/2015    . Bupivacaine HCl (MARCAINE IJ) Inject as directed. Reported on 12/22/2015    . calcium-vitamin D (OSCAL WITH D) 500-200 MG-UNIT per tablet Take 1 tablet by mouth daily.      . Cyanocobalamin (B-12) 50 MCG TABS Take 50 mcg by mouth daily.     . cyclobenzaprine (FLEXERIL) 10 MG tablet TAKE 1 TABLET (10 MG TOTAL) BY MOUTH AT BEDTIME. 90 tablet 1  .  dexlansoprazole (DEXILANT) 60 MG capsule Take 1 capsule (60 mg total) by mouth daily. 10 capsule 0  . diclofenac sodium (VOLTAREN) 1 % GEL Apply topically 4 (four) times daily.    . DULoxetine (CYMBALTA) 30 MG capsule 1 tablet po daily in the morning 90 capsule 3  . DULoxetine (CYMBALTA) 60 MG capsule Take 1 capsule (60 mg total) by mouth at bedtime. 90 capsule 3  . estradiol (CLIMARA - DOSED IN MG/24 HR) 0.1 mg/24hr patch Place 0.1 mg onto the skin once a week.    . Iron-Vitamin C (FE C PO) Take 1 tablet by mouth daily.     . Multiple Vitamin (MULTI-DAY VITAMINS PO) Take 1 tablet by mouth daily.      . promethazine-dextromethorphan (PROMETHAZINE-DM) 6.25-15 MG/5ML syrup Take 5 mLs by mouth 4 (four) times daily as needed for cough. 120 mL 0  . traMADol (ULTRAM) 50 MG tablet Take by mouth every 6 (six) hours as needed. Reported on 09/29/2015     No current facility-administered medications on file prior to visit.     ROS as in subjective   Objective: BP 96/68   Pulse 77   Temp 98.4 F (36.9 C)   Wt 127 lb (57.6 kg)   LMP 11/01/2014 (Approximate) Comment:  Mirena IUD  SpO2 99%   BMI 23.61 kg/m   General appearance: Alert, WD/WN, no distress                             Skin: warm, no rash                           Head: +maxillary sinus tenderness,                            Eyes: conjunctiva normal, corneas clear, PERRLA                            Ears: pearly TMs, external ear canals normal                          Nose: septum midline, turbinates swollen, with erythema and clear discharge             Mouth/throat: MMM, tongue normal, mild pharyngeal erythema                           Neck: supple, no adenopathy, no thyromegaly, non tender                          Heart: RRR, normal S1, S2, no murmurs                         Lungs: CTA bilaterally, no wheezes, rales, or rhonchi       Assessment  Encounter Diagnoses  Name Primary?  . Acute non-recurrent maxillary sinusitis Yes   . Cough   . Flu-like symptoms       Plan: Begin amoxicillin, can c/t promethazine DM, rest, hydrate well.     Specific home care recommendations today include:  Pain/fever relief: You may use over-the-counter Tylenol for pain or fever  Drink extra fluids. Fluids help thin the mucus so your sinuses can drain more easily.   Applying either moist heat or ice packs to the sinus areas may help relieve discomfort.  Use saline nasal sprays to help moisten your sinuses. The sprays can be found at your local drugstore.   Sharon Bowman was seen today for head congestion , nausea.  Diagnoses and all orders for this visit:  Acute non-recurrent maxillary sinusitis  Cough  Flu-like symptoms -     POC Influenza A&B(BINAX/QUICKVUE)  Other orders -     amoxicillin (AMOXIL) 500 MG tablet; 2 tablets po BID x 10 days   Patient was advised to call or return if worse or not improving in the next few days.    Patient voiced understanding of diagnosis, recommendations, and treatment plan.

## 2016-08-16 ENCOUNTER — Ambulatory Visit: Payer: Self-pay | Admitting: Physician Assistant

## 2016-10-08 ENCOUNTER — Ambulatory Visit (INDEPENDENT_AMBULATORY_CARE_PROVIDER_SITE_OTHER): Payer: BLUE CROSS/BLUE SHIELD | Admitting: Family Medicine

## 2016-10-08 ENCOUNTER — Encounter: Payer: Self-pay | Admitting: Family Medicine

## 2016-10-08 VITALS — BP 100/60 | HR 81 | Temp 97.8°F | Resp 16 | Wt 123.8 lb

## 2016-10-08 DIAGNOSIS — R591 Generalized enlarged lymph nodes: Secondary | ICD-10-CM

## 2016-10-08 LAB — CBC WITH DIFFERENTIAL/PLATELET
Basophils Absolute: 0 cells/uL (ref 0–200)
Basophils Relative: 0 %
EOS PCT: 2 %
Eosinophils Absolute: 138 cells/uL (ref 15–500)
HEMATOCRIT: 39.8 % (ref 35.0–45.0)
HEMOGLOBIN: 13.1 g/dL (ref 11.7–15.5)
LYMPHS ABS: 2139 {cells}/uL (ref 850–3900)
Lymphocytes Relative: 31 %
MCH: 30.8 pg (ref 27.0–33.0)
MCHC: 32.9 g/dL (ref 32.0–36.0)
MCV: 93.6 fL (ref 80.0–100.0)
MONO ABS: 759 {cells}/uL (ref 200–950)
MPV: 10.8 fL (ref 7.5–12.5)
Monocytes Relative: 11 %
NEUTROS ABS: 3864 {cells}/uL (ref 1500–7800)
NEUTROS PCT: 56 %
Platelets: 286 10*3/uL (ref 140–400)
RBC: 4.25 MIL/uL (ref 3.80–5.10)
RDW: 13.3 % (ref 11.0–15.0)
WBC: 6.9 10*3/uL (ref 4.0–10.5)

## 2016-10-08 NOTE — Patient Instructions (Signed)
We will call you with lab results.  If you notice any new or worsening symptoms then call or return.  If you are not continuing to improve and back to normal in a couple of weeks then you should return.     Lymphadenopathy Lymphadenopathy refers to swollen or enlarged lymph glands, also called lymph nodes. Lymph glands are part of your body's defense (immune) system, which protects the body from infections, germs, and diseases. Lymph glands are found in many locations in your body, including the neck, underarm, and groin. Many things can cause lymph glands to become enlarged. When your immune system responds to germs, such as viruses or bacteria, infection-fighting cells and fluid build up. This causes the glands to grow in size. Usually, this is not something to worry about. The swelling and any soreness often go away without treatment. However, swollen lymph glands can also be caused by a number of diseases. Your health care provider may do various tests to help determine the cause. If the cause of your swollen lymph glands cannot be found, it is important to monitor your condition to make sure the swelling goes away. Follow these instructions at home: Watch your condition for any changes. The following actions may help to lessen any discomfort you are feeling:  Get plenty of rest.  Take medicines only as directed by your health care provider. Your health care provider may recommend over-the-counter medicines for pain.  Apply moist heat compresses to the site of swollen lymph nodes as directed by your health care provider. This can help reduce any pain.  Check your lymph nodes daily for any changes.  Keep all follow-up visits as directed by your health care provider. This is important. Contact a health care provider if:  Your lymph nodes are still swollen after 2 weeks.  Your swelling increases or spreads to other areas.  Your lymph nodes are hard, seem fixed to the skin, or are growing  rapidly.  Your skin over the lymph nodes is red and inflamed.  You have a fever.  You have chills.  You have fatigue.  You develop a sore throat.  You have abdominal pain.  You have weight loss.  You have night sweats. Get help right away if:  You notice fluid leaking from the area of the enlarged lymph node.  You have severe pain in any area of your body.  You have chest pain.  You have shortness of breath. This information is not intended to replace advice given to you by your health care provider. Make sure you discuss any questions you have with your health care provider. Document Released: 03/27/2008 Document Revised: 11/24/2015 Document Reviewed: 01/21/2014 Elsevier Interactive Patient Education  2017 [employer].

## 2016-10-08 NOTE — Progress Notes (Signed)
   Subjective:    Patient ID: Margaretha Glassing, female    DOB: [birth year], 41 y.o.   MRN: [id number]  HPI Chief Complaint  Patient presents with  . knot    knot on right side and moving to back of head and feels like more are popping up left side. noticied it last tuesday   She is here with complaints of a one week history of feeling a knot of the right side of her neck that is tender. States the knot appears to be getting smaller over the past couple of days and is at least 20% smaller than initially. Complains of the right posterior part of her head feeling tender.   Denies recent illness, injury or scalp problem. No rash.   Denies fever, chills, headache, dizziness, rhinorrhea, nasal congestion, sore throat, ear pain, cough, chest pain, abdominal pain, N/V/D.   Reviewed allergies, medications, past medical, surgical, and social history.    Review of Systems Pertinent positives and negatives in the history of present illness.     Objective:   Physical Exam BP 100/60   Pulse 81   Temp 97.8 F (36.6 C) (Oral)   Resp 16   Wt 123 lb 12.8 oz (56.2 kg)   LMP 11/01/2014 (Approximate) Comment: Mirena IUD  SpO2 98%   BMI 23.01 kg/m   Alert and in no distress. Well appearing. No sinus tenderness. Scalp unremarkable.  Tympanic membranes and canals are normal. Nares patent and normal. Pharyngeal area is normal. Neck is supple. Enlarged right posterior cervical lymph node that is soft, tender and movable. No supraclavicular or axillary adenopathy. No other adenopathy observed. Cardiac exam shows a regular sinus rhythm without murmurs or gallops. Lungs are clear to auscultation.      Assessment & Plan:  Lymphadenopathy - Plan: CBC with Differential/Platelet  Discussed that she has a single enlarged lymph node at this point without any clear etiology. She reports that it does seem to be improving.  Plan to check CBC and have her follow up if she notices any new symptoms or if the lymph  node is not continuing reduce in size.

## 2016-12-13 ENCOUNTER — Telehealth: Payer: Self-pay

## 2016-12-13 NOTE — Telephone Encounter (Signed)
Records faxed to [facility] per signed request. [phone]. Sharon Bowman

## 2016-12-14 DIAGNOSIS — Z0279 Encounter for issue of other medical certificate: Secondary | ICD-10-CM

## 2017-01-03 ENCOUNTER — Other Ambulatory Visit: Payer: Self-pay | Admitting: Medical

## 2017-01-03 NOTE — Telephone Encounter (Signed)
Can she have a refill on this 

## 2017-01-11 ENCOUNTER — Other Ambulatory Visit: Payer: Self-pay | Admitting: Nephrology

## 2017-01-11 DIAGNOSIS — Q613 Polycystic kidney, unspecified: Secondary | ICD-10-CM

## 2017-01-11 DIAGNOSIS — N181 Chronic kidney disease, stage 1: Secondary | ICD-10-CM
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
## 2017-01-23 ENCOUNTER — Ambulatory Visit
Admission: RE | Admit: 2017-01-23 | Discharge: 2017-01-23 | Disposition: A | Discharge status: Home / Self Care | Payer: BLUE CROSS/BLUE SHIELD | Source: Ambulatory Visit | Attending: Nephrology | Admitting: Nephrology

## 2017-01-23 DIAGNOSIS — N181 Chronic kidney disease, stage 1: Secondary | ICD-10-CM

## 2017-01-23 DIAGNOSIS — Q613 Polycystic kidney, unspecified: Secondary | ICD-10-CM

## 2017-01-25 ENCOUNTER — Ambulatory Visit (INDEPENDENT_AMBULATORY_CARE_PROVIDER_SITE_OTHER): Payer: BLUE CROSS/BLUE SHIELD | Admitting: Rheumatology

## 2017-01-25 ENCOUNTER — Encounter: Payer: Self-pay | Admitting: Rheumatology

## 2017-01-25 VITALS — BP 107/66 | HR 85 | Resp 14 | Ht 62.0 in | Wt 118.0 lb

## 2017-01-25 DIAGNOSIS — Z8659 Personal history of other mental and behavioral disorders: Secondary | ICD-10-CM

## 2017-01-25 DIAGNOSIS — Z9889 Other specified postprocedural states: Secondary | ICD-10-CM

## 2017-01-25 DIAGNOSIS — Z87718 Personal history of other specified (corrected) congenital malformations of genitourinary system: Secondary | ICD-10-CM

## 2017-01-25 DIAGNOSIS — F5101 Primary insomnia: Secondary | ICD-10-CM | POA: Diagnosis not present

## 2017-01-25 DIAGNOSIS — Z87891 Personal history of nicotine dependence: Secondary | ICD-10-CM | POA: Diagnosis not present

## 2017-01-25 DIAGNOSIS — M2241 Chondromalacia patellae, right knee: Secondary | ICD-10-CM

## 2017-01-25 DIAGNOSIS — M19041 Primary osteoarthritis, right hand: Secondary | ICD-10-CM | POA: Diagnosis not present

## 2017-01-25 DIAGNOSIS — E559 Vitamin D deficiency, unspecified: Secondary | ICD-10-CM | POA: Diagnosis not present

## 2017-01-25 DIAGNOSIS — Z87440 Personal history of urinary (tract) infections: Secondary | ICD-10-CM

## 2017-01-25 DIAGNOSIS — M170 Bilateral primary osteoarthritis of knee: Secondary | ICD-10-CM

## 2017-01-25 DIAGNOSIS — M2242 Chondromalacia patellae, left knee: Secondary | ICD-10-CM

## 2017-01-25 DIAGNOSIS — M542 Cervicalgia: Secondary | ICD-10-CM

## 2017-01-25 DIAGNOSIS — M797 Fibromyalgia: Secondary | ICD-10-CM | POA: Diagnosis not present

## 2017-01-25 DIAGNOSIS — M19042 Primary osteoarthritis, left hand: Secondary | ICD-10-CM

## 2017-01-25 DIAGNOSIS — Z86718 Personal history of other venous thrombosis and embolism: Secondary | ICD-10-CM

## 2017-01-25 MED ORDER — TRIAMCINOLONE ACETONIDE 40 MG/ML IJ SUSP
10.0000 mg | INTRAMUSCULAR | Status: AC | PRN
  Administered 2017-01-25: 10 mg via INTRAMUSCULAR

## 2017-01-25 MED ORDER — LIDOCAINE HCL 1 % IJ SOLN
0.5000 mL | INTRAMUSCULAR | Status: AC | PRN
  Administered 2017-01-25: .5 mL

## 2017-02-04 ENCOUNTER — Ambulatory Visit (INDEPENDENT_AMBULATORY_CARE_PROVIDER_SITE_OTHER): Payer: BLUE CROSS/BLUE SHIELD | Admitting: Medical

## 2017-02-04 ENCOUNTER — Encounter: Payer: Self-pay | Admitting: Medical

## 2017-02-04 VITALS — BP 110/70 | HR 72 | Wt 116.2 lb

## 2017-02-04 DIAGNOSIS — F5101 Primary insomnia: Secondary | ICD-10-CM

## 2017-02-04 DIAGNOSIS — F329 Major depressive disorder, single episode, unspecified: Secondary | ICD-10-CM | POA: Diagnosis not present

## 2017-02-04 DIAGNOSIS — F419 Anxiety disorder, unspecified: Secondary | ICD-10-CM | POA: Diagnosis not present

## 2017-02-04 DIAGNOSIS — M797 Fibromyalgia: Secondary | ICD-10-CM | POA: Diagnosis not present

## 2017-02-04 DIAGNOSIS — F32A Depression, unspecified: Secondary | ICD-10-CM

## 2017-02-04 MED ORDER — HYDROXYZINE HCL 10 MG PO TABS: 10.0000 mg | ORAL_TABLET | Freq: Three times a day (TID) | ORAL | 0 refills | Status: DC | PRN
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
## 2017-03-03 ENCOUNTER — Other Ambulatory Visit: Payer: Self-pay | Admitting: Medical

## 2017-03-05 NOTE — Telephone Encounter (Signed)
Can she have a refill on this 

## 2017-03-19 ENCOUNTER — Encounter: Payer: Self-pay | Admitting: Medical

## 2017-03-19 ENCOUNTER — Ambulatory Visit (INDEPENDENT_AMBULATORY_CARE_PROVIDER_SITE_OTHER): Payer: BLUE CROSS/BLUE SHIELD | Admitting: Medical

## 2017-03-19 VITALS — BP 100/60 | HR 72 | Resp 16 | Wt 119.6 lb

## 2017-03-19 DIAGNOSIS — S8011XA Contusion of right lower leg, initial encounter: Secondary | ICD-10-CM | POA: Diagnosis not present

## 2017-03-19 DIAGNOSIS — M79604 Pain in right leg: Secondary | ICD-10-CM | POA: Diagnosis not present

## 2017-03-19 DIAGNOSIS — Z23 Encounter for immunization: Secondary | ICD-10-CM | POA: Diagnosis not present

## 2017-03-19 DIAGNOSIS — D169 Benign neoplasm of bone and articular cartilage, unspecified: Secondary | ICD-10-CM

## 2017-03-19 DIAGNOSIS — M25571 Pain in right ankle and joints of right foot: Secondary | ICD-10-CM

## 2017-03-19 NOTE — Progress Notes (Signed)
Subjective: Chief Complaint  Patient presents with  . Leg Pain    Right leg.    Here for pain in right leg x 2 weeks.  She notes hx/o bilat osteochondroma in both legs, had one removed from right lower leg 2015.   she notes in past 2 weeks has had pain in right medial lower leg below knee, right ankle, bruising in both areas with out injury, trauma, or fall. No significant swelling, no fever, no other body aches or joint pains new. She does deal with fibromyalgia daily.   No other aggravating or relieving factors. No other complaint.  Past Medical History:  Diagnosis Date  . Allergic asthma 08/2015  . Anemia   . Anxiety   . Arthritis    osetoarthritis  . Chronic sinusitis   . Depression   . DVT (deep venous thrombosis) ([place]) 04/2014   right leg - behind calf, no treated with aspirin daily  . Endometriosis   . Family history of premature CAD   . Fibromyalgia 2017  . Former smoker   . GAD (generalized anxiety disorder)   . Headache   . History of PFTs 08/2015   normal  . Interstitial cystitis   . PCOS (polycystic ovarian syndrome)   . Polycystic kidney disease       Past Surgical History:  Procedure Laterality Date  . BLADDER SURGERY     BLADDER STRETCH X 3  . carpel radial tunnel  2014  . carpel tunnel     left  and right hand   . CYSTO WITH HYDRODISTENSION N/A 11/11/2014   Procedure: CYSTOSCOPY/HYDRODISTENSION MARCAINE AND PYRIDIUM AND Esmeralda Links;  Surgeon: Carolan Clines, MD;  Location: [facility];  Service: Urology;  Laterality: N/A;  . CYSTOSCOPY N/A 11/11/2014   Procedure: CYSTOSCOPY;  Surgeon: Aloha Gell, MD;  Location: [facility];  Service: Gynecology;  Laterality: N/A;  . CYSTOSCOPY WITH RETROGRADE PYELOGRAM, URETEROSCOPY AND STENT PLACEMENT Bilateral 11/11/2014   Procedure:  RETROGRADE PYELOGRAM with BILATERAL URETERAL CATHETHERS;  Surgeon: Carolan Clines, MD;  Location: [facility];  Service: Urology;  Laterality: Bilateral;  . IUD REMOVAL N/A 11/11/2014   Procedure:  INTRAUTERINE DEVICE (IUD) REMOVAL;  Surgeon: Aloha Gell, MD;  Location: [facility];  Service: Gynecology;  Laterality: N/A;  . LAPAROSCOPY     X 2 ENDOMETRIOSIS.  Marland Kitchen LEG SURGERY  04/2014   right knee - tumor - benighn  . polycystic kindey disease     [place] kidney(Coladonato)  . ROBOTIC ASSISTED LAPAROSCOPIC LYSIS OF ADHESION N/A 11/11/2014   Procedure: ROBOTIC ASSISTED LAPAROSCOPIC LYSIS OF ADHESION;  Surgeon: Aloha Gell, MD;  Location: [facility];  Service: Gynecology;  Laterality: N/A;  . ROBOTIC ASSISTED TOTAL HYSTERECTOMY WITH BILATERAL SALPINGO OOPHERECTOMY Bilateral 11/11/2014   Procedure: ROBOTIC ASSISTED TOTAL HYSTERECTOMY WITH BILATERAL SALPINGO OOPHORECTOMY;  Surgeon: Aloha Gell, MD;  Location: [facility];  Service: Gynecology;  Laterality: Bilateral;  . TOOTH EXTRACTION     ROS as in subjective   Objective: BP 100/60   Pulse 72   Resp 16   Wt 119 lb 9.6 oz (54.3 kg)   LMP 11/01/2014 (Approximate) Comment: Mirena IUD  SpO2 97%   BMI 21.88 kg/m   Gen: wd, wn, nad Skin: mild purplish bruising of right medial ankle, otherwise no obvious bruiding, no bleeding Oral: MMM, no lesions or bleeding Right leg inferior to knee medially with surgical scar, tender medial anterior lower leg approx 4-5 cm below patella, there is a fullness in area of tenderness, and just medial and  superior to the tender area is a mobile non tender small 3-96mm diamtere cystic small lesion Tender over medial lower leg just above medial malleolus without deformity.  There is a small mobile nontender 85mm diamtere cystic lesion in this area.  No other bony deformity.  No pain with knee or ankle ROM.   No deformity otherwise.  Legs neurovascularly intact    Assessment: Encounter Diagnoses  Name Primary?  . Right leg pain Yes  . Contusion of right lower leg, initial encounter   . Acute right ankle pain   . Need for influenza vaccination   . Osteochondroma     Plan: Right leg pain, bruising -  etiology unclear.  She has hx/o osteochondroma, there is tenderness in right medial leg below knee and above ankle, bruising, but no swelling or other obvious cause.  Will send for xrays of right leg and ankle.  Advised rest, ice with bag of frozen peas 33min BID, and we will call with results.    Counseled on the influenza virus vaccine.  Vaccine information sheet given.  Influenza vaccine given after consent obtained.  F/u pending xray

## 2017-03-20 ENCOUNTER — Ambulatory Visit
Admission: RE | Admit: 2017-03-20 | Discharge: 2017-03-20 | Disposition: A | Discharge status: Home / Self Care | Payer: BLUE CROSS/BLUE SHIELD | Source: Ambulatory Visit | Attending: Medical | Admitting: Medical

## 2017-03-20 DIAGNOSIS — M25571 Pain in right ankle and joints of right foot: Secondary | ICD-10-CM

## 2017-03-20 DIAGNOSIS — M79604 Pain in right leg: Secondary | ICD-10-CM

## 2017-03-20 DIAGNOSIS — S8011XA Contusion of right lower leg, initial encounter: Secondary | ICD-10-CM

## 2017-03-27 ENCOUNTER — Telehealth: Payer: Self-pay | Admitting: Medical

## 2017-03-27 ENCOUNTER — Other Ambulatory Visit: Payer: Self-pay | Admitting: Medical

## 2017-03-27 MED ORDER — DICLOFENAC SODIUM 1 % TD GEL: 2.0000 g | Freq: Four times a day (QID) | TRANSDERMAL | 1 refills | Status: DC

## 2017-03-27 NOTE — Telephone Encounter (Signed)
Pt called and stated that she has less Voltaren than she thought. She needs a refill sent to [facility].

## 2017-04-05 ENCOUNTER — Other Ambulatory Visit: Payer: Self-pay | Admitting: Medical

## 2017-04-12 ENCOUNTER — Other Ambulatory Visit: Payer: Self-pay | Admitting: Medical

## 2017-05-14 ENCOUNTER — Other Ambulatory Visit: Payer: Self-pay | Admitting: Medical

## 2017-06-20 ENCOUNTER — Telehealth: Payer: Self-pay | Admitting: Medical

## 2017-06-20 NOTE — Telephone Encounter (Signed)
Pt came in with rx orders. I made a copy and gave her back the original. [facility] is requesting labs. Sending back for review. Please advise pt at [phone]. Pt will bring original back when approved and she comes in for labs.

## 2017-06-21 ENCOUNTER — Other Ambulatory Visit: Payer: Self-pay | Admitting: Medical

## 2017-06-21 DIAGNOSIS — Q613 Polycystic kidney, unspecified: Secondary | ICD-10-CM

## 2017-06-21 NOTE — Telephone Encounter (Signed)
Called and l/m for pt call us back set up an appt for an nurse visit

## 2017-06-21 NOTE — Telephone Encounter (Signed)
Have her come in for labs.  These were ordered by Dr. Frederik Pear at [facility]  [phone] p   Diagnosis: M25.561

## 2017-06-25 ENCOUNTER — Other Ambulatory Visit: Payer: Self-pay | Admitting: Medical

## 2017-07-23 ENCOUNTER — Other Ambulatory Visit: Payer: Self-pay | Admitting: Medical

## 2017-09-13 NOTE — Progress Notes (Deleted)
Office Visit Note  Patient: Sharon Bowman             Date of Birth: 07/07/1975           MRN: [id number]             PCP: Carlena Hurl, PA-C Referring: Carlena Hurl, PA-C Visit Date: 09/26/2017 Occupation: @GUAROCC @    Subjective:  No chief complaint on file.   History of Present Illness: Sharon Bowman is a 42 y.o. female ***   Activities of Daily Living:  Patient reports morning stiffness for *** {minute/hour:19697}.   Patient {ACTIONS;DENIES/REPORTS:21021675::"Denies"} nocturnal pain.  Difficulty dressing/grooming: {ACTIONS;DENIES/REPORTS:21021675::"Denies"} Difficulty climbing stairs: {ACTIONS;DENIES/REPORTS:21021675::"Denies"} Difficulty getting out of chair: {ACTIONS;DENIES/REPORTS:21021675::"Denies"} Difficulty using hands for taps, buttons, cutlery, and/or writing: {ACTIONS;DENIES/REPORTS:21021675::"Denies"}   No Rheumatology ROS completed.   PMFS History:  Patient Active Problem List   Diagnosis Date Noted  . Surgical menopause 08/06/2016  . Cough 08/06/2016  . Burning in the chest 08/06/2016  . Vitamin D deficiency 08/06/2016  . Family history of osteoporosis 08/02/2016  . Encounter for health maintenance examination in adult 09/29/2015  . PCOS (polycystic ovarian syndrome) 09/29/2015  . Anxiety and depression 09/29/2015  . Arthritis, senescent 09/29/2015  . Absolute anemia 09/29/2015  . History of DVT (deep vein thrombosis) 09/29/2015  . Former smoker 09/29/2015  . S/P hysterectomy 09/29/2015  . Allergic asthma 09/29/2015  . Family history of premature CAD 09/29/2015  . Fibromyalgia 08/01/2015  . Insomnia 08/01/2015  . Endometriosis determined by laparoscopy 11/11/2014  . IC (interstitial cystitis) 03/21/2012  . Polycystic kidney disease 10/09/2011  . Endometriosis of pelvis 10/09/2011    Past Medical History:  Diagnosis Date  . Allergic asthma 08/2015  . Anemia   . Anxiety   . Arthritis    osetoarthritis  . Chronic sinusitis     . Depression   . DVT (deep venous thrombosis) ([place]) 04/2014   right leg - behind calf, no treated with aspirin daily  . Endometriosis   . Family history of premature CAD   . Fibromyalgia 2017  . Former smoker   . GAD (generalized anxiety disorder)   . Headache   . History of PFTs 08/2015   normal  . Interstitial cystitis   . PCOS (polycystic ovarian syndrome)   . Polycystic kidney disease     Family History  Problem Relation Age of Onset  . Diabetes Mother   . Macular degeneration Mother   . Hyperlipidemia Mother   . Hypertension Mother   . Polycystic kidney disease Father   . Heart disease Father 28       MI, CABG  . Diabetes Father   . Hepatitis Father        C  . Hypothyroidism Father   . Hypertension Father   . Cancer Maternal Grandfather        bone  . Heart disease Paternal Grandmother   . Heart disease Paternal Grandfather    Past Surgical History:  Procedure Laterality Date  . BLADDER SURGERY     BLADDER STRETCH X 3  . carpel radial tunnel  2014  . carpel tunnel     left  and right hand   . CYSTO WITH HYDRODISTENSION N/A 11/11/2014   Procedure: CYSTOSCOPY/HYDRODISTENSION MARCAINE AND PYRIDIUM AND Esmeralda Links;  Surgeon: Carolan Clines, MD;  Location: [facility];  Service: Urology;  Laterality: N/A;  . CYSTOSCOPY N/A 11/11/2014   Procedure: CYSTOSCOPY;  Surgeon: Aloha Gell, MD;  Location: [facility];  Service: Gynecology;  Laterality: N/A;  . CYSTOSCOPY WITH RETROGRADE PYELOGRAM, URETEROSCOPY AND STENT PLACEMENT Bilateral 11/11/2014   Procedure:  RETROGRADE PYELOGRAM with BILATERAL URETERAL CATHETHERS;  Surgeon: Carolan Clines, MD;  Location: [facility];  Service: Urology;  Laterality: Bilateral;  . IUD REMOVAL N/A 11/11/2014   Procedure: INTRAUTERINE DEVICE (IUD) REMOVAL;  Surgeon: Aloha Gell, MD;  Location: [facility];  Service: Gynecology;  Laterality: N/A;  . LAPAROSCOPY     X 2 ENDOMETRIOSIS.  Marland Kitchen LEG SURGERY  04/2014   right knee - tumor - benighn  . polycystic  kindey disease     [place] kidney(Coladonato)  . ROBOTIC ASSISTED LAPAROSCOPIC LYSIS OF ADHESION N/A 11/11/2014   Procedure: ROBOTIC ASSISTED LAPAROSCOPIC LYSIS OF ADHESION;  Surgeon: Aloha Gell, MD;  Location: [facility];  Service: Gynecology;  Laterality: N/A;  . ROBOTIC ASSISTED TOTAL HYSTERECTOMY WITH BILATERAL SALPINGO OOPHERECTOMY Bilateral 11/11/2014   Procedure: ROBOTIC ASSISTED TOTAL HYSTERECTOMY WITH BILATERAL SALPINGO OOPHORECTOMY;  Surgeon: Aloha Gell, MD;  Location: [facility];  Service: Gynecology;  Laterality: Bilateral;  . TOOTH EXTRACTION     Social History   Social History Narrative  . Not on file     Objective: Vital Signs: LMP 11/01/2014 (Approximate) Comment: Mirena IUD   Physical Exam   Musculoskeletal Exam: ***  CDAI Exam: No CDAI exam completed.    Investigation: No additional findings.   Imaging: No results found.  Speciality Comments: No specialty comments available.    Procedures:  No procedures performed Allergies: Latex   Assessment / Plan:     Visit Diagnoses: No diagnosis found.    Orders: No orders of the defined types were placed in this encounter.  No orders of the defined types were placed in this encounter.   Face-to-face time spent with patient was *** minutes. 50% of time was spent in counseling and coordination of care.  Follow-Up Instructions: No Follow-up on file.   Earnestine Mealing, CMA  Note - This record has been created using Editor, commissioning.  Chart creation errors have been sought, but may not always  have been located. Such creation errors do not reflect on  the standard of medical care.

## 2017-09-23 ENCOUNTER — Ambulatory Visit (INDEPENDENT_AMBULATORY_CARE_PROVIDER_SITE_OTHER): Payer: Medicare HMO | Admitting: Medical

## 2017-09-23 ENCOUNTER — Encounter: Payer: Self-pay | Admitting: Medical

## 2017-09-23 VITALS — BP 108/78 | HR 80 | Temp 97.7°F | Ht 61.0 in | Wt 115.0 lb

## 2017-09-23 DIAGNOSIS — R059 Cough, unspecified: Secondary | ICD-10-CM

## 2017-09-23 DIAGNOSIS — J019 Acute sinusitis, unspecified: Secondary | ICD-10-CM

## 2017-09-23 DIAGNOSIS — R0602 Shortness of breath: Secondary | ICD-10-CM

## 2017-09-23 DIAGNOSIS — R05 Cough: Secondary | ICD-10-CM | POA: Diagnosis not present

## 2017-09-23 MED ORDER — AMOXICILLIN 875 MG PO TABS: 875.0000 mg | ORAL_TABLET | Freq: Two times a day (BID) | ORAL | 0 refills | Status: DC

## 2017-09-23 NOTE — Progress Notes (Signed)
Subjective:  Sharon Bowman is a 42 y.o. female who presents for  Chief Complaint  Patient presents with  . Sinusitis    coughing mucous, headache    Symptoms include 6 day hx/o sinus pressure, productive cough, nausea, some ear pressure, mainly right sided, had some blood tinged drainage as well, some SOB.  Denies fever, vomiting, sore throat, diarrhea.  Using Alka Seltzers Plus for symptoms. denies sick contacts.  Past history is significant for asthma.  Doesn't have an inhaler. Patient is not a smoker. No other aggravating or relieving factors.  No other complaint.    Past Medical History:  Diagnosis Date  . Allergic asthma 08/2015  . Anemia   . Anxiety   . Arthritis    osetoarthritis  . Chronic sinusitis   . Depression   . DVT (deep venous thrombosis) ([place]) 04/2014   right leg - behind calf, no treated with aspirin daily  . Endometriosis   . Family history of premature CAD   . Fibromyalgia 2017  . Former smoker   . GAD (generalized anxiety disorder)   . Headache   . History of PFTs 08/2015   normal  . Interstitial cystitis   . PCOS (polycystic ovarian syndrome)   . Polycystic kidney disease     Current Outpatient Medications on File Prior to Visit  Medication Sig Dispense Refill  . amitriptyline (ELAVIL) 50 MG tablet Take 1 tablet (50 mg total) by mouth at bedtime. 90 tablet 3  . aspirin 81 MG tablet Take 81 mg by mouth daily.    Marland Kitchen aspirin-acetaminophen-caffeine (EXCEDRIN MIGRAINE) 250-250-65 MG per tablet Take 1 tablet by mouth every 6 (six) hours as needed for headache or migraine. Reported on 09/29/2015    . calcium-vitamin D (OSCAL WITH D) 500-200 MG-UNIT per tablet Take 1 tablet by mouth daily.      . Cyanocobalamin (B-12) 50 MCG TABS Take 50 mcg by mouth daily.     . diclofenac sodium (VOLTAREN) 1 % GEL Apply 2 g topically 4 (four) times daily. 100 g 1  . DULoxetine (CYMBALTA) 60 MG capsule Take 1 capsule (60 mg total) by mouth at bedtime. 90 capsule 3  .  estradiol (CLIMARA - DOSED IN MG/24 HR) 0.1 mg/24hr patch Place 0.1 mg onto the skin once a week.    . hydrOXYzine (ATARAX/VISTARIL) 10 MG tablet TAKE 1 TABLET BY MOUTH THREE TIMES A DAY AS NEEDED 90 tablet 0  . Iron-Vitamin C (FE C PO) Take 1 tablet by mouth daily.     . Multiple Vitamin (MULTI-DAY VITAMINS PO) Take 1 tablet by mouth daily.      . cyclobenzaprine (FLEXERIL) 10 MG tablet TAKE 1 TABLET (10 MG TOTAL) BY MOUTH AT BEDTIME. (Patient not taking: Reported on 09/23/2017) 90 tablet 0   No current facility-administered medications on file prior to visit.     ROS as in subjective   Objective: BP 108/78 (BP Location: Right Arm, Patient Position: Sitting, Cuff Size: Normal)   Pulse 80   Temp 97.7 F (36.5 C) (Oral)   Ht 5\' 1"  (1.549 m)   Wt 115 lb (52.2 kg)   LMP 11/01/2014 (Approximate) Comment: Mirena IUD  SpO2 98%   BMI 21.73 kg/m   Wt Readings from Last 3 Encounters:  09/23/17 115 lb (52.2 kg)  03/19/17 119 lb 9.6 oz (54.3 kg)  02/04/17 116 lb 3.2 oz (52.7 kg)    General appearance: Alert, well developed, well nourished, no distress  Skin: warm, no rash                           Head: +maxillary sinus tenderness,                            Eyes: conjunctiva clear, corneas clear                            Ears: normal left tympanic membrane, normal right tympanic membrane, external ear canals normal                          Nose: septum midline, turbinates swollen, with erythema and clear discharge             Mouth/throat: MMM, tongue normal, mild pharyngeal erythema                           Neck: supple, no adenopathy, no thyromegaly, non tender                         Lungs: bronchial, no wheezes, no rales, no rhonchi        Assessment  Encounter Diagnoses  Name Primary?  . Acute non-recurrent sinusitis, unspecified location Yes  . SOB (shortness of breath)   . Cough       Plan: Discussed diagnoses above.   Discussed usual time  frame to see improvement. Discussed possible complications or symptoms that would prompt call back or recheck within the next few days.     Medications prescribed:  She can c/t short term Alka Selzter for cough suppression if OTC cough medication isn't helping.   Caution advised as this may cause drowsiness.  Complete the course of Amoxicillin antibiotic prescribed today.    Specific home care recommendations discussed:  Drink extra fluids. Fluids help thin the mucus so your sinuses can drain more easily.   Applying either moist heat or ice packs to the sinus areas may help relieve discomfort.  Use saline nasal sprays to help moisten your sinuses. The sprays can be found at your local drugstore.   Patient was advised to call or return if worse or not improving in the next few days.    Patient voiced understanding of diagnosis, recommendations, and treatment plan.

## 2017-09-26 ENCOUNTER — Ambulatory Visit: Payer: Self-pay | Admitting: Rheumatology

## 2017-10-10 NOTE — Progress Notes (Signed)
Office Visit Note  Patient: Sharon Bowman             Date of Birth: May 14, 1976           MRN: [id number]             PCP: Carlena Hurl, PA-C Referring: Carlena Hurl, PA-C Visit Date: 10/15/2017 Occupation: @GUAROCC @    Subjective:  Generalized pain    History of Present Illness: Sharon Bowman is a 42 y.o. female with history of fibromyalgia and osteoarthritis.  Patient continues to take Cymbalta 60 mg in the morning and 60 mg at bedtime.  She is also taking amitriptyline at bedtime which has been helping with her insomnia.  She states that she has been sleeping about 6-10 hours a night.  She states her sleep is been very interrupted.  She states her fatigue continues to worsen.  She states that she had a fibromyalgia flare 2 weeks ago.  She continues to have generalized muscle tenderness and muscle aches.  Her pain is most severe in the bilateral trapezius region.  She states that she continues to stretch and do squats at least twice a day.  She states she is also trying to stay very active.  She states that she continues to have pain in bilateral knees.  She states her pain is worse in her right knee.  She has swelling in her bilateral knees.  She states that she has pain in her bilateral hands.  She states that she has been experiencing numbness in her fingertips that is similar to the pain when she had carpal tunnel bilaterally.  She states that going to integrative therapies helped pain considerably.   She uses Voltaren gel and hemp creams on a regular basis.      Activities of Daily Living:  Patient reports morning stiffness for 2 hour.   Patient Reports nocturnal pain.  Difficulty dressing/grooming: Denies Difficulty climbing stairs: Reports Difficulty getting out of chair: Reports Difficulty using hands for taps, buttons, cutlery, and/or writing: Reports   Review of Systems  Constitutional: Positive for fatigue.  HENT: Positive for mouth dryness. Negative for  mouth sores and nose dryness.   Eyes: Positive for dryness. Negative for pain, redness and visual disturbance.  Respiratory: Negative for cough, hemoptysis, shortness of breath and difficulty breathing.   Cardiovascular: Negative for chest pain, palpitations, hypertension and swelling in legs/feet.  Gastrointestinal: Negative for blood in stool, constipation and diarrhea.  Endocrine: Negative for increased urination.  Genitourinary: Negative for painful urination.  Musculoskeletal: Positive for arthralgias, joint pain, joint swelling, morning stiffness and muscle tenderness. Negative for myalgias, muscle weakness and myalgias.  Skin: Negative for color change, pallor, rash, hair loss, nodules/bumps, skin tightness, ulcers and sensitivity to sunlight.  Allergic/Immunologic: Negative for susceptible to infections.  Neurological: Negative for dizziness, numbness, headaches and weakness.  Hematological: Negative for swollen glands.  Psychiatric/Behavioral: Positive for depressed mood and sleep disturbance. The patient is nervous/anxious.     PMFS History:  Patient Active Problem List   Diagnosis Date Noted  . Surgical menopause 08/06/2016  . Cough 08/06/2016  . Burning in the chest 08/06/2016  . Vitamin D deficiency 08/06/2016  . Family history of osteoporosis 08/02/2016  . Encounter for health maintenance examination in adult 09/29/2015  . PCOS (polycystic ovarian syndrome) 09/29/2015  . Anxiety and depression 09/29/2015  . Arthritis, senescent 09/29/2015  . Absolute anemia 09/29/2015  . History of DVT (deep vein thrombosis) 09/29/2015  . Former smoker  09/29/2015  . S/P hysterectomy 09/29/2015  . Allergic asthma 09/29/2015  . Family history of premature CAD 09/29/2015  . Fibromyalgia 08/01/2015  . Insomnia 08/01/2015  . Endometriosis determined by laparoscopy 11/11/2014  . IC (interstitial cystitis) 03/21/2012  . Polycystic kidney disease 10/09/2011  . Endometriosis of pelvis  10/09/2011    Past Medical History:  Diagnosis Date  . Allergic asthma 08/2015  . Anemia   . Anxiety   . Arthritis    osetoarthritis  . Chronic sinusitis   . Depression   . DVT (deep venous thrombosis) ([place]) 04/2014   right leg - behind calf, no treated with aspirin daily  . Endometriosis   . Family history of premature CAD   . Fibromyalgia 2017  . Former smoker   . GAD (generalized anxiety disorder)   . Headache   . History of PFTs 08/2015   normal  . Interstitial cystitis   . PCOS (polycystic ovarian syndrome)   . Polycystic kidney disease     Family History  Problem Relation Age of Onset  . Diabetes Mother   . Macular degeneration Mother   . Hyperlipidemia Mother   . Hypertension Mother   . Polycystic kidney disease Father   . Heart disease Father 82       MI, CABG  . Diabetes Father   . Hepatitis Father        C  . Hypothyroidism Father   . Hypertension Father   . Cancer Maternal Grandfather        bone  . Heart disease Paternal Grandmother   . Heart disease Paternal Grandfather    Past Surgical History:  Procedure Laterality Date  . ABDOMINAL HYSTERECTOMY    . BLADDER SURGERY     BLADDER STRETCH X 3  . carpel radial tunnel  2014  . carpel tunnel     left  and right hand   . CYSTO WITH HYDRODISTENSION N/A 11/11/2014   Procedure: CYSTOSCOPY/HYDRODISTENSION MARCAINE AND PYRIDIUM AND Esmeralda Links;  Surgeon: Carolan Clines, MD;  Location: [facility];  Service: Urology;  Laterality: N/A;  . CYSTOSCOPY N/A 11/11/2014   Procedure: CYSTOSCOPY;  Surgeon: Aloha Gell, MD;  Location: [facility];  Service: Gynecology;  Laterality: N/A;  . CYSTOSCOPY WITH RETROGRADE PYELOGRAM, URETEROSCOPY AND STENT PLACEMENT Bilateral 11/11/2014   Procedure:  RETROGRADE PYELOGRAM with BILATERAL URETERAL CATHETHERS;  Surgeon: Carolan Clines, MD;  Location: [facility];  Service: Urology;  Laterality: Bilateral;  . IUD REMOVAL N/A 11/11/2014   Procedure: INTRAUTERINE DEVICE (IUD) REMOVAL;   Surgeon: Aloha Gell, MD;  Location: [facility];  Service: Gynecology;  Laterality: N/A;  . LAPAROSCOPY     X 2 ENDOMETRIOSIS.  Marland Kitchen LEG SURGERY  04/2014   right knee - tumor - benighn  . polycystic kindey disease     [facility]([place])  . ROBOTIC ASSISTED LAPAROSCOPIC LYSIS OF ADHESION N/A 11/11/2014   Procedure: ROBOTIC ASSISTED LAPAROSCOPIC LYSIS OF ADHESION;  Surgeon: Aloha Gell, MD;  Location: [facility];  Service: Gynecology;  Laterality: N/A;  . ROBOTIC ASSISTED TOTAL HYSTERECTOMY WITH BILATERAL SALPINGO OOPHERECTOMY Bilateral 11/11/2014   Procedure: ROBOTIC ASSISTED TOTAL HYSTERECTOMY WITH BILATERAL SALPINGO OOPHORECTOMY;  Surgeon: Aloha Gell, MD;  Location: [facility];  Service: Gynecology;  Laterality: Bilateral;  . TOOTH EXTRACTION     Social History   Social History Narrative  . Not on file     Objective: Vital Signs: BP 111/70 (BP Location: Left Leg, Patient Position: Sitting, Cuff Size: Small)   Pulse 79   Resp 12  Ht 5\' 1"  (1.549 m)   Wt 117 lb (53.1 kg)   LMP 11/01/2014 (Approximate)   BMI 22.11 kg/m    Physical Exam  Constitutional: She is oriented to person, place, and time. She appears well-developed and well-nourished.  HENT:  Head: Normocephalic and atraumatic.  Eyes: Conjunctivae and EOM are normal.  Neck: Normal range of motion.  Cardiovascular: Normal rate, regular rhythm, normal heart sounds and intact distal pulses.  Pulmonary/Chest: Effort normal and breath sounds normal.  Abdominal: Soft. Bowel sounds are normal.  Lymphadenopathy:    She has no cervical adenopathy.  Neurological: She is alert and oriented to person, place, and time.  Skin: Skin is warm and dry. Capillary refill takes less than 2 seconds.  Psychiatric: She has a normal mood and affect. Her behavior is normal.  Nursing note and vitals reviewed.    Musculoskeletal Exam: C-spine, thoracic spine, lumbar spine good range of motion.  No midline spinal tenderness.  No SI joint  tenderness.  Shoulder joints, elbow joints, wrist joints, MCPs, PIPs, DIPs good range of motion with no synovitis.  Hip joints, knee joints, ankle joints, MTPs, PIPs, DIPs good range of motion with no synovitis.  No warmth or effusion of bilateral knees.  No knee crepitus.  She has tenderness of bilateral trochanteric bursa.  She has tenderness trapezius muscles bilaterally.  CDAI Exam: No CDAI exam completed.    Investigation: No additional findings. CBC Latest Ref Rng & Units 10/08/2016 09/29/2015 11/12/2014  WBC 4.0 - 10.5 K/uL 6.9 7.6 16.2(H)  Hemoglobin 11.7 - 15.5 g/dL 13.1 12.4 10.0(L)  Hematocrit 35.0 - 45.0 % 39.8 37.0 29.9(L)  Platelets 140 - 400 K/uL 286 304 260   CMP Latest Ref Rng & Units 09/29/2015 03/09/2015 11/12/2014  Glucose 65 - 99 mg/dL 83 71 113(H)  BUN 7 - 25 mg/dL 12 10 10   Creatinine 0.50 - 1.10 mg/dL 0.72 0.75 0.67  Sodium 135 - 146 mmol/L 136 139 137  Potassium 3.5 - 5.3 mmol/L 4.6 4.6 3.8  Chloride 98 - 110 mmol/L 101 101 107  CO2 20 - 31 mmol/L 25 28 26   Calcium 8.6 - 10.2 mg/dL 9.4 9.9 7.9(L)  Total Protein 6.1 - 8.1 g/dL 6.4 - 5.5(L)  Total Bilirubin 0.2 - 1.2 mg/dL 0.2 - 0.4  Alkaline Phos 33 - 115 U/L 51 - 52  AST 10 - 30 U/L 18 - 19  ALT 6 - 29 U/L 17 - 15    Imaging: No results found.  Speciality Comments: No specialty comments available.    Procedures:  Trigger Point Inj Date/Time: 10/15/2017 1:25 PM Performed by: Ofilia Neas, PA-C Authorized by: Ofilia Neas, PA-C   Consent Given by:  Patient Site marked: the procedure site was marked   Timeout: prior to procedure the correct patient, procedure, and site was verified   Indications:  Pain and muscle spasm Total # of Trigger Points:  2 Location: neck   Needle Size:  27 G Approach:  Dorsal (Trigger point injections of bilateral trapezius muscles) Medications #1:  0.5 mL lidocaine 1 %; 10 mg triamcinolone acetonide 40 MG/ML Medications #2:  0.5 mL lidocaine 1 %; 10 mg triamcinolone  acetonide 40 MG/ML Patient tolerance:  Patient tolerated the procedure well with no immediate complications   Allergies: Latex   Assessment / Plan:     Visit Diagnoses: Fibromyalgia: She had a fibromyalgia flare 2 weeks ago.  She continues to have generalized muscle tenderness and muscle aches.  She has  chronic fatigue and insomnia.  She will continue taking Cymbalta 60 mg in the morning and 60 mg at bedtime and as well as amitriptyline 50 mg at bedtime.  She is no longer taking Flexeril.  She uses Voltaren gel and tamped products on a regular basis for pain relief.  She was encouraged to continue to stay active.  She performs strengthening and stretching exercises on a daily basis.  Trapezius muscle spasm: She has muscle tenderness and muscle tension in the trapezius region bilaterally.  She requested trigger point injections bilaterally today in the office.  She tolerated the procedure well.  She is advised to monitor her blood pressure closely if on the cortisone injection today.  Primary insomnia: She takes amitriptyline 50 mg at bedtime.  Good sleep hygiene was discussed.  History of bilateral carpal tunnel release  Primary osteoarthritis of both hands: She has no synovitis on exam.  She experiences discomfort in bilateral hands.  Joint protection and muscle strengthening were discussed.    Primary osteoarthritis of both knees: No warmth or effusion noted.  No knee crepitus.  She has been having increased discomfort in her bilateral knees.  She is given a handout on knee exercises that she can perform at home.  Vitamin D deficiency: She is taking a calcium and vitamin D supplement.  History of anxiety: She takes Cymbalta 60 mg in the morning and 60 mg at bedtime.  Other medical conditions are listed as follows:  History of polycystic kidney disease  History of DVT (deep vein thrombosis)  IC (interstitial cystitis)  Former smoker     Orders: Orders Placed This Encounter    Procedures  . Trigger Point Inj   No orders of the defined types were placed in this encounter.   Face-to-face time spent with patient was 30 minutes. >50% of time was spent in counseling and coordination of care.  Follow-Up Instructions: Return in about 6 months (around 04/16/2018) for Fibromyalgia, Osteoarthritis.   Ofilia Neas, PA-C  Note - This record has been created using Dragon software.  Chart creation errors have been sought, but may not always  have been located. Such creation errors do not reflect on  the standard of medical care.

## 2017-10-14 ENCOUNTER — Ambulatory Visit: Payer: Self-pay | Admitting: Rheumatology

## 2017-10-15 ENCOUNTER — Ambulatory Visit: Payer: Medicare HMO | Admitting: Physician Assistant

## 2017-10-15 ENCOUNTER — Encounter: Payer: Self-pay | Admitting: Physician Assistant

## 2017-10-15 VITALS — BP 111/70 | HR 79 | Resp 12 | Ht 61.0 in | Wt 117.0 lb

## 2017-10-15 DIAGNOSIS — M19041 Primary osteoarthritis, right hand: Secondary | ICD-10-CM

## 2017-10-15 DIAGNOSIS — Z8659 Personal history of other mental and behavioral disorders: Secondary | ICD-10-CM

## 2017-10-15 DIAGNOSIS — Z9889 Other specified postprocedural states: Secondary | ICD-10-CM | POA: Diagnosis not present

## 2017-10-15 DIAGNOSIS — E559 Vitamin D deficiency, unspecified: Secondary | ICD-10-CM

## 2017-10-15 DIAGNOSIS — Z86718 Personal history of other venous thrombosis and embolism: Secondary | ICD-10-CM | POA: Diagnosis not present

## 2017-10-15 DIAGNOSIS — M62838 Other muscle spasm: Secondary | ICD-10-CM | POA: Diagnosis not present

## 2017-10-15 DIAGNOSIS — F5101 Primary insomnia: Secondary | ICD-10-CM

## 2017-10-15 DIAGNOSIS — N3010 Interstitial cystitis (chronic) without hematuria: Secondary | ICD-10-CM

## 2017-10-15 DIAGNOSIS — M19042 Primary osteoarthritis, left hand: Secondary | ICD-10-CM

## 2017-10-15 DIAGNOSIS — Z87891 Personal history of nicotine dependence: Secondary | ICD-10-CM | POA: Diagnosis not present

## 2017-10-15 DIAGNOSIS — Z87718 Personal history of other specified (corrected) congenital malformations of genitourinary system: Secondary | ICD-10-CM | POA: Diagnosis not present

## 2017-10-15 DIAGNOSIS — M170 Bilateral primary osteoarthritis of knee: Secondary | ICD-10-CM

## 2017-10-15 DIAGNOSIS — M797 Fibromyalgia: Secondary | ICD-10-CM | POA: Diagnosis not present

## 2017-10-15 MED ORDER — LIDOCAINE HCL 1 % IJ SOLN
0.5000 mL | INTRAMUSCULAR | Status: AC | PRN
  Administered 2017-10-15: .5 mL

## 2017-10-15 MED ORDER — TRIAMCINOLONE ACETONIDE 40 MG/ML IJ SUSP
10.0000 mg | INTRAMUSCULAR | Status: AC | PRN
  Administered 2017-10-15: 10 mg via INTRAMUSCULAR

## 2017-10-15 NOTE — Patient Instructions (Signed)

## 2017-10-18 ENCOUNTER — Other Ambulatory Visit: Payer: Self-pay | Admitting: Medical

## 2017-10-21 NOTE — Telephone Encounter (Signed)
Is this okay to refill? 

## 2017-10-22 ENCOUNTER — Other Ambulatory Visit: Payer: Self-pay | Admitting: Medical

## 2017-10-22 NOTE — Telephone Encounter (Signed)
Please advise [facility]ine[facility] [place]

## 2017-12-31 ENCOUNTER — Other Ambulatory Visit: Payer: Self-pay | Admitting: Family Medicine

## 2017-12-31 NOTE — Telephone Encounter (Signed)
[facility] is requesting to ill pt hydroxyzine. Please advise [facility]

## 2018-01-31 ENCOUNTER — Other Ambulatory Visit: Payer: Self-pay | Admitting: Family Medicine

## 2018-01-31 DIAGNOSIS — Z118 Encounter for screening for other infectious and parasitic diseases: Secondary | ICD-10-CM | POA: Diagnosis not present

## 2018-01-31 DIAGNOSIS — Z1159 Encounter for screening for other viral diseases: Secondary | ICD-10-CM | POA: Diagnosis not present

## 2018-01-31 DIAGNOSIS — Z01419 Encounter for gynecological examination (general) (routine) without abnormal findings: Secondary | ICD-10-CM | POA: Diagnosis not present

## 2018-01-31 DIAGNOSIS — N760 Acute vaginitis: Secondary | ICD-10-CM | POA: Diagnosis not present

## 2018-01-31 DIAGNOSIS — Z114 Encounter for screening for human immunodeficiency virus [HIV]: Secondary | ICD-10-CM | POA: Diagnosis not present

## 2018-01-31 DIAGNOSIS — Z113 Encounter for screening for infections with a predominantly sexual mode of transmission: Secondary | ICD-10-CM | POA: Diagnosis not present

## 2018-01-31 NOTE — Telephone Encounter (Signed)
Is this ok to refill?  

## 2018-02-27 ENCOUNTER — Other Ambulatory Visit: Payer: Self-pay | Admitting: Family Medicine

## 2018-02-27 DIAGNOSIS — Z1231 Encounter for screening mammogram for malignant neoplasm of breast: Secondary | ICD-10-CM | POA: Diagnosis not present

## 2018-02-27 LAB — HM MAMMOGRAPHY

## 2018-02-27 NOTE — Telephone Encounter (Signed)
[facility]. Please advise [facility] 

## 2018-02-27 NOTE — Telephone Encounter (Signed)
Your patient 

## 2018-03-04 ENCOUNTER — Telehealth: Payer: Self-pay | Admitting: Medical

## 2018-03-04 NOTE — Telephone Encounter (Signed)
Lets get in for physical and discuss medications.   I looked back and it appears Cymbalta was last filled in 09/2017 by her Rheumatologist.  It appeared they are still involved in her care, so not sure why she thinks they wont refill it?

## 2018-03-04 NOTE — Telephone Encounter (Signed)
  She takes Cymbalta and her Neurologist will not refill because she has not seen him in a year. She wants to know if you will refill Her cymbalta   She states she left voicemail earlier this morning but has not heard back

## 2018-03-05 ENCOUNTER — Telehealth: Payer: Self-pay | Admitting: Family Medicine

## 2018-03-05 NOTE — Telephone Encounter (Signed)
Lets see her tomorrow to discuss, but her Rheumatologist (not Urologist) in the 09/2017 visit commented on the Cymbalta as if they were managing this.

## 2018-03-05 NOTE — Telephone Encounter (Signed)
Left message on voicemail for patient to call back and schedule CPE with [facility]

## 2018-03-05 NOTE — Telephone Encounter (Signed)
Pt called and states urologist has been refilling her cymbalta in the past.  She went to get refill Friday and they are not going to refill it anymore.  Pt has been without Cymbalta since Friday.  Pt coming in tomorrow for MED CHECK.  She wants to know if you will refill today to [facility], since she has been without so long.  I told her I would ask.  But her last med check was over a year ago.  Please advise pt [phone]

## 2018-03-06 ENCOUNTER — Ambulatory Visit (INDEPENDENT_AMBULATORY_CARE_PROVIDER_SITE_OTHER): Payer: Medicare HMO | Admitting: Medical

## 2018-03-06 VITALS — BP 120/70 | HR 97 | Temp 97.7°F | Resp 16 | Ht 61.0 in | Wt 119.0 lb

## 2018-03-06 DIAGNOSIS — F5101 Primary insomnia: Secondary | ICD-10-CM | POA: Diagnosis not present

## 2018-03-06 DIAGNOSIS — M797 Fibromyalgia: Secondary | ICD-10-CM | POA: Diagnosis not present

## 2018-03-06 DIAGNOSIS — Z90710 Acquired absence of both cervix and uterus: Secondary | ICD-10-CM | POA: Diagnosis not present

## 2018-03-06 DIAGNOSIS — E559 Vitamin D deficiency, unspecified: Secondary | ICD-10-CM

## 2018-03-06 DIAGNOSIS — E282 Polycystic ovarian syndrome: Secondary | ICD-10-CM

## 2018-03-06 DIAGNOSIS — Q613 Polycystic kidney, unspecified: Secondary | ICD-10-CM

## 2018-03-06 DIAGNOSIS — Z23 Encounter for immunization: Secondary | ICD-10-CM | POA: Diagnosis not present

## 2018-03-06 DIAGNOSIS — F329 Major depressive disorder, single episode, unspecified: Secondary | ICD-10-CM

## 2018-03-06 DIAGNOSIS — F419 Anxiety disorder, unspecified: Secondary | ICD-10-CM | POA: Diagnosis not present

## 2018-03-06 DIAGNOSIS — E8940 Asymptomatic postprocedural ovarian failure: Secondary | ICD-10-CM | POA: Diagnosis not present

## 2018-03-06 MED ORDER — DULOXETINE HCL 60 MG PO CPEP: 60.0000 mg | ORAL_CAPSULE | Freq: Two times a day (BID) | ORAL | 3 refills | Status: DC

## 2018-03-06 MED ORDER — AMITRIPTYLINE HCL 50 MG PO TABS: 50.0000 mg | ORAL_TABLET | Freq: Every day | ORAL | 3 refills | Status: DC

## 2018-03-06 NOTE — Progress Notes (Signed)
Subjective: Chief Complaint  Patient presents with  . refill    refill on cymbalta   Medical team: Hazel Sams, PA with Dr. Estanislado Pandy, rheumatology Dr. Amalia Hailey, urology Dr. Servando Salina, nephrology [facility] Zyire Eidson, Camelia Eng, PA-C here for primary care  Here for medication discussion.  After she had initially seen rheumatology over a year ago I had prescribed her Cymbalta.  At some point her urologist apparently started refilling this medication.  However when she ran out last week they would not refill it as her last visit had been a while there.  She is not sure why her pharmacy never sent me refill request earlier this year.  Was without Cymbalta all weekend.  Ran out recently.  Was initially put on this for Fibromyalgia, takes 60mg  BID.  Has felt not well in a little flushed since running out of Cymbalta  She has polycystic kidney disease, sees Dr. [facility] every 6 months, has appt there within next 2 weeks  She has fibromyalgia and joint pains, sees rheumatology, uses Voltaren gel occasionally not daily.    No recent asthma issues.  Exercise - stretches daily, stays active.  Not doing much cardio other than walking at work.   No emergency in past 3 weeks.  Sees gynecology, [facility], had mammogram last week  No other aggravating or relieving factors. No other complaint.   Past Medical History:  Diagnosis Date  . Allergic asthma 08/2015  . Anemia   . Anxiety   . Arthritis    osetoarthritis  . Chronic sinusitis   . Depression   . DVT (deep venous thrombosis) ([place]) 04/2014   right leg - behind calf, no treated with aspirin daily  . Endometriosis   . Family history of premature CAD   . Fibromyalgia 2017  . Former smoker   . GAD (generalized anxiety disorder)   . Headache   . History of PFTs 08/2015   normal  . Interstitial cystitis   . PCOS (polycystic ovarian syndrome)   . Polycystic kidney disease    Current Outpatient Medications on  File Prior to Visit  Medication Sig Dispense Refill  . aspirin 81 MG tablet Take 81 mg by mouth daily.    Marland Kitchen aspirin-acetaminophen-caffeine (EXCEDRIN MIGRAINE) 250-250-65 MG per tablet Take 1 tablet by mouth every 6 (six) hours as needed for headache or migraine. Reported on 09/29/2015    . calcium-vitamin D (OSCAL WITH D) 500-200 MG-UNIT per tablet Take 1 tablet by mouth daily.      . Cyanocobalamin (B-12) 50 MCG TABS Take 50 mcg by mouth daily.     . diclofenac sodium (VOLTAREN) 1 % GEL Apply 2 g topically 4 (four) times daily. 100 g 1  . estradiol (CLIMARA - DOSED IN MG/24 HR) 0.1 mg/24hr patch Place 0.1 mg onto the skin once a week.    . hydrOXYzine (ATARAX/VISTARIL) 10 MG tablet TAKE 1 TABLET BY MOUTH THREE TIMES A DAY AS NEEDED 90 tablet 0  . Iron-Vitamin C (FE C PO) Take 1 tablet by mouth daily.     . Multiple Vitamin (MULTI-DAY VITAMINS PO) Take 1 tablet by mouth daily.       No current facility-administered medications on file prior to visit.    ROS as in subjective   Objective: BP 120/70   Pulse 97   Temp 97.7 F (36.5 C) (Oral)   Resp 16   Ht 5\' 1"  (1.549 m)   Wt 119 lb (54 kg)   LMP  11/01/2014 (Approximate)   SpO2 96%   BMI 22.48 kg/m   General appearence: alert, no distress, WD/WN,  Neck: supple, no lymphadenopathy, no thyromegaly, no masses Heart: RRR, normal S1, S2, no murmurs Lungs: CTA bilaterally, no wheezes, rhonchi, or rales Abdomen: +bs, soft, non tender, non distended, no masses, no hepatomegaly, no splenomegaly Pulses: 2+ symmetric, upper and lower extremities, normal cap refill Ext: no edema    Assessment: Encounter Diagnoses  Name Primary?  . Fibromyalgia Yes  . Polycystic kidney disease   . Vitamin D deficiency   . S/P hysterectomy   . Surgical menopause   . Primary insomnia   . Anxiety and depression   . PCOS (polycystic ovarian syndrome)   . Need for influenza vaccination      Plan: Refill medications as below, asked her to tell  pharmacy to send the refills request on these medications in the future.  She seems to do okay on these medications for sleep and fibromyalgia.  Advised continue with exercise regimen and stretching daily  Counseled on the influenza virus vaccine.  Vaccine information sheet given.  Influenza vaccine given after consent obtained.  Her last labs were several months ago but she sees nephrology next week.  We will await lab results from them  Continue routine follow-up with nephrology, rheumatology  We will request copy of her most recent gynecological visit and mammogram  Harlan was seen today for refill.  Diagnoses and all orders for this visit:  Fibromyalgia  Polycystic kidney disease  Vitamin D deficiency  S/P hysterectomy  Surgical menopause  Primary insomnia  Anxiety and depression  PCOS (polycystic ovarian syndrome)  Need for influenza vaccination  Other orders -     amitriptyline (ELAVIL) 50 MG tablet; Take 1 tablet (50 mg total) by mouth at bedtime. -     DULoxetine (CYMBALTA) 60 MG capsule; Take 1 capsule (60 mg total) by mouth 2 (two) times daily.

## 2018-03-06 NOTE — Telephone Encounter (Signed)
Patient has an appointment 03-06-18

## 2018-03-14 DIAGNOSIS — R7303 Prediabetes: Secondary | ICD-10-CM | POA: Diagnosis not present

## 2018-03-14 DIAGNOSIS — Q613 Polycystic kidney, unspecified: Secondary | ICD-10-CM | POA: Diagnosis not present

## 2018-03-14 DIAGNOSIS — I82409 Acute embolism and thrombosis of unspecified deep veins of unspecified lower extremity: Secondary | ICD-10-CM | POA: Diagnosis not present

## 2018-03-14 DIAGNOSIS — E611 Iron deficiency: Secondary | ICD-10-CM | POA: Diagnosis not present

## 2018-03-14 DIAGNOSIS — D169 Benign neoplasm of bone and articular cartilage, unspecified: Secondary | ICD-10-CM | POA: Diagnosis not present

## 2018-03-14 DIAGNOSIS — F329 Major depressive disorder, single episode, unspecified: Secondary | ICD-10-CM | POA: Diagnosis not present

## 2018-03-14 DIAGNOSIS — E559 Vitamin D deficiency, unspecified: Secondary | ICD-10-CM | POA: Diagnosis not present

## 2018-03-14 DIAGNOSIS — Z720 Tobacco use: Secondary | ICD-10-CM | POA: Diagnosis not present

## 2018-03-14 DIAGNOSIS — N181 Chronic kidney disease, stage 1: Secondary | ICD-10-CM | POA: Diagnosis not present

## 2018-03-21 ENCOUNTER — Telehealth: Payer: Self-pay | Admitting: Medical

## 2018-03-21 NOTE — Telephone Encounter (Signed)
Received requested records from [facility]. Sending back for review.  °

## 2018-03-28 ENCOUNTER — Other Ambulatory Visit: Payer: Self-pay | Admitting: Medical

## 2018-03-28 NOTE — Telephone Encounter (Signed)
Is this ok to refill?  

## 2018-04-02 NOTE — Progress Notes (Deleted)
Office Visit Note  Patient: Sharon Bowman             Date of Birth: 1975/10/21           MRN: [id number]             PCP: Carlena Hurl, PA-C Referring: Carlena Hurl, PA-C Visit Date: 04/16/2018 Occupation: @GUAROCC @  Subjective:  No chief complaint on file.   History of Present Illness: Sharon Bowman is a 42 y.o. female ***   Activities of Daily Living:  Patient reports morning stiffness for *** {minute/hour:19697}.   Patient {ACTIONS;DENIES/REPORTS:21021675::"Denies"} nocturnal pain.  Difficulty dressing/grooming: {ACTIONS;DENIES/REPORTS:21021675::"Denies"} Difficulty climbing stairs: {ACTIONS;DENIES/REPORTS:21021675::"Denies"} Difficulty getting out of chair: {ACTIONS;DENIES/REPORTS:21021675::"Denies"} Difficulty using hands for taps, buttons, cutlery, and/or writing: {ACTIONS;DENIES/REPORTS:21021675::"Denies"}  No Rheumatology ROS completed.   PMFS History:  Patient Active Problem List   Diagnosis Date Noted  . Need for influenza vaccination 03/06/2018  . Surgical menopause 08/06/2016  . Cough 08/06/2016  . Burning in the chest 08/06/2016  . Vitamin D deficiency 08/06/2016  . Family history of osteoporosis 08/02/2016  . Encounter for health maintenance examination in adult 09/29/2015  . PCOS (polycystic ovarian syndrome) 09/29/2015  . Anxiety and depression 09/29/2015  . Arthritis, senescent 09/29/2015  . Absolute anemia 09/29/2015  . History of DVT (deep vein thrombosis) 09/29/2015  . Former smoker 09/29/2015  . S/P hysterectomy 09/29/2015  . Allergic asthma 09/29/2015  . Family history of premature CAD 09/29/2015  . Fibromyalgia 08/01/2015  . Insomnia 08/01/2015  . Endometriosis determined by laparoscopy 11/11/2014  . IC (interstitial cystitis) 03/21/2012  . Polycystic kidney disease 10/09/2011  . Endometriosis of pelvis 10/09/2011    Past Medical History:  Diagnosis Date  . Allergic asthma 08/2015  . Anemia   . Anxiety   . Arthritis    osetoarthritis  . Chronic sinusitis   . Depression   . DVT (deep venous thrombosis) ([place]) 04/2014   right leg - behind calf, no treated with aspirin daily  . Endometriosis   . Family history of premature CAD   . Fibromyalgia 2017  . Former smoker   . GAD (generalized anxiety disorder)   . Headache   . History of PFTs 08/2015   normal  . Interstitial cystitis   . PCOS (polycystic ovarian syndrome)   . Polycystic kidney disease     Family History  Problem Relation Age of Onset  . Diabetes Mother   . Macular degeneration Mother   . Hyperlipidemia Mother   . Hypertension Mother   . Polycystic kidney disease Father   . Heart disease Father 67       MI, CABG  . Diabetes Father   . Hepatitis Father        C  . Hypothyroidism Father   . Hypertension Father   . Cancer Maternal Grandfather        bone  . Heart disease Paternal Grandmother   . Heart disease Paternal Grandfather    Past Surgical History:  Procedure Laterality Date  . ABDOMINAL HYSTERECTOMY    . BLADDER SURGERY     BLADDER STRETCH X 3  . carpel radial tunnel  2014  . carpel tunnel     left  and right hand   . CYSTO WITH HYDRODISTENSION N/A 11/11/2014   Procedure: CYSTOSCOPY/HYDRODISTENSION MARCAINE AND PYRIDIUM AND Esmeralda Links;  Surgeon: Carolan Clines, MD;  Location: [facility];  Service: Urology;  Laterality: N/A;  . CYSTOSCOPY N/A 11/11/2014   Procedure: CYSTOSCOPY;  Surgeon: Aloha Gell,  MD;  Location: [facility];  Service: Gynecology;  Laterality: N/A;  . CYSTOSCOPY WITH RETROGRADE PYELOGRAM, URETEROSCOPY AND STENT PLACEMENT Bilateral 11/11/2014   Procedure:  RETROGRADE PYELOGRAM with BILATERAL URETERAL CATHETHERS;  Surgeon: Carolan Clines, MD;  Location: [facility];  Service: Urology;  Laterality: Bilateral;  . IUD REMOVAL N/A 11/11/2014   Procedure: INTRAUTERINE DEVICE (IUD) REMOVAL;  Surgeon: Aloha Gell, MD;  Location: [facility];  Service: Gynecology;  Laterality: N/A;  . LAPAROSCOPY     X 2 ENDOMETRIOSIS.  Marland Kitchen  LEG SURGERY  04/2014   right knee - tumor - benighn  . polycystic kindey disease     [place] kidney(Coladonato)  . ROBOTIC ASSISTED LAPAROSCOPIC LYSIS OF ADHESION N/A 11/11/2014   Procedure: ROBOTIC ASSISTED LAPAROSCOPIC LYSIS OF ADHESION;  Surgeon: Aloha Gell, MD;  Location: [facility];  Service: Gynecology;  Laterality: N/A;  . ROBOTIC ASSISTED TOTAL HYSTERECTOMY WITH BILATERAL SALPINGO OOPHERECTOMY Bilateral 11/11/2014   Procedure: ROBOTIC ASSISTED TOTAL HYSTERECTOMY WITH BILATERAL SALPINGO OOPHORECTOMY;  Surgeon: Aloha Gell, MD;  Location: [facility];  Service: Gynecology;  Laterality: Bilateral;  . TOOTH EXTRACTION     Social History   Social History Narrative  . Not on file    Objective: Vital Signs: LMP 11/01/2014 (Approximate)    Physical Exam   Musculoskeletal Exam: ***  CDAI Exam: CDAI Score: Not documented Patient Global Assessment: Not documented; Provider Global Assessment: Not documented Swollen: Not documented; Tender: Not documented Joint Exam   Not documented   There is currently no information documented on the homunculus. Go to the Rheumatology activity and complete the homunculus joint exam.  Investigation: No additional findings.  Imaging: No results found.  Recent Labs: Lab Results  Component Value Date   WBC 6.9 10/08/2016   HGB 13.1 10/08/2016   PLT 286 10/08/2016   NA 136 09/29/2015   K 4.6 09/29/2015   CL 101 09/29/2015   CO2 25 09/29/2015   GLUCOSE 83 09/29/2015   BUN 12 09/29/2015   CREATININE 0.72 09/29/2015   BILITOT 0.2 09/29/2015   ALKPHOS 51 09/29/2015   AST 18 09/29/2015   ALT 17 09/29/2015   PROT 6.4 09/29/2015   ALBUMIN 4.1 09/29/2015   ALBUMIN 4.1 09/29/2015   CALCIUM 9.4 09/29/2015   GFRAA >60 11/12/2014    Speciality Comments: No specialty comments available.  Procedures:  No procedures performed Allergies: Latex   Assessment / Plan:     Visit Diagnoses: Fibromyalgia - Cymbalta 60 mg in the morning and 60 mg  at bedtime and as well as amitriptyline 50 mg at bedtime.   Primary insomnia  Trapezius muscle spasm  History of bilateral carpal tunnel release  Primary osteoarthritis of both hands  Primary osteoarthritis of both knees  Vitamin D deficiency  IC (interstitial cystitis)  History of anxiety  History of polycystic kidney disease  History of DVT (deep vein thrombosis)  Former smoker   Orders: No orders of the defined types were placed in this encounter.  No orders of the defined types were placed in this encounter.   Face-to-face time spent with patient was *** minutes. Greater than 50% of time was spent in counseling and coordination of care.  Follow-Up Instructions: No follow-ups on file.   Ofilia Neas, PA-C  Note - This record has been created using Dragon software.  Chart creation errors have been sought, but may not always  have been located. Such creation errors do not reflect on  the standard of medical care.

## 2018-04-07 ENCOUNTER — Other Ambulatory Visit: Payer: Self-pay

## 2018-04-07 MED ORDER — HYDROXYZINE HCL 10 MG PO TABS: 10.0000 mg | ORAL_TABLET | Freq: Three times a day (TID) | ORAL | 1 refills | Status: DC | PRN

## 2018-04-07 NOTE — Telephone Encounter (Signed)
Pt stated she need a 90 days supply on her hydroxyzine. She takes this medication 3 times a day every day . Please advise.

## 2018-04-10 DIAGNOSIS — N9489 Other specified conditions associated with female genital organs and menstrual cycle: Secondary | ICD-10-CM | POA: Diagnosis not present

## 2018-04-10 DIAGNOSIS — N3010 Interstitial cystitis (chronic) without hematuria: Secondary | ICD-10-CM | POA: Diagnosis not present

## 2018-04-16 ENCOUNTER — Ambulatory Visit: Payer: Self-pay | Admitting: Physician Assistant

## 2018-06-04 DIAGNOSIS — N760 Acute vaginitis: Secondary | ICD-10-CM | POA: Diagnosis not present

## 2018-06-26 ENCOUNTER — Telehealth: Payer: Self-pay | Admitting: Medical

## 2018-06-26 ENCOUNTER — Other Ambulatory Visit: Payer: Self-pay | Admitting: Medical

## 2018-06-26 MED ORDER — LORAZEPAM 0.5 MG PO TABS: 0.5000 mg | ORAL_TABLET | Freq: Two times a day (BID) | ORAL | 0 refills | Status: DC | PRN

## 2018-06-26 NOTE — Telephone Encounter (Signed)
  Please call  She wants to see if you can call in rx for her. Her father passed away yesterday and her uncle just passed about an hour ago  Needs rx " to get her thru it"   [facility]

## 2018-06-26 NOTE — Telephone Encounter (Signed)
I sent Ativan to calm nerves, can use BID prn.   Have her schedule f/u in a week or 2 to see how she is doing and to discuss medication if refills needed depending on how the next week or 2 goes.

## 2018-06-26 NOTE — Telephone Encounter (Signed)
Spoke to patient and she said Thank you for your concern.  She does have a support system available  She can't remember about the ativan and how it worked for her

## 2018-06-26 NOTE — Telephone Encounter (Signed)
Advised pt meds sent in. She will call back to schedule follow up appt for 1-2 weeks

## 2018-06-26 NOTE — Telephone Encounter (Signed)
Please let her know that I am so sorry to hear of this.   I know this must be quite hard to deal with.     Regarding medication, she has used Ativan in the past.   Is this something that helped calmed her nerves in the past, did she tolerate this ok?  If so, I'll send this to use the next few days.     Ask about her support?  Does she have siblings in the area or a close friend that she can be with or talk through during this difficulty time?

## 2018-09-09 DIAGNOSIS — N760 Acute vaginitis: Secondary | ICD-10-CM | POA: Diagnosis not present

## 2018-11-14 IMAGING — CR DG ANKLE COMPLETE 3+V*R*
3 series · 3 of 3 positions shown · non-contrast
Comparison: 03/20/2017

CLINICAL DATA: Right lower ankle pain, injury

EXAM:
RIGHT ANKLE - COMPLETE 3+ VIEW

[t ankle joint ap right]
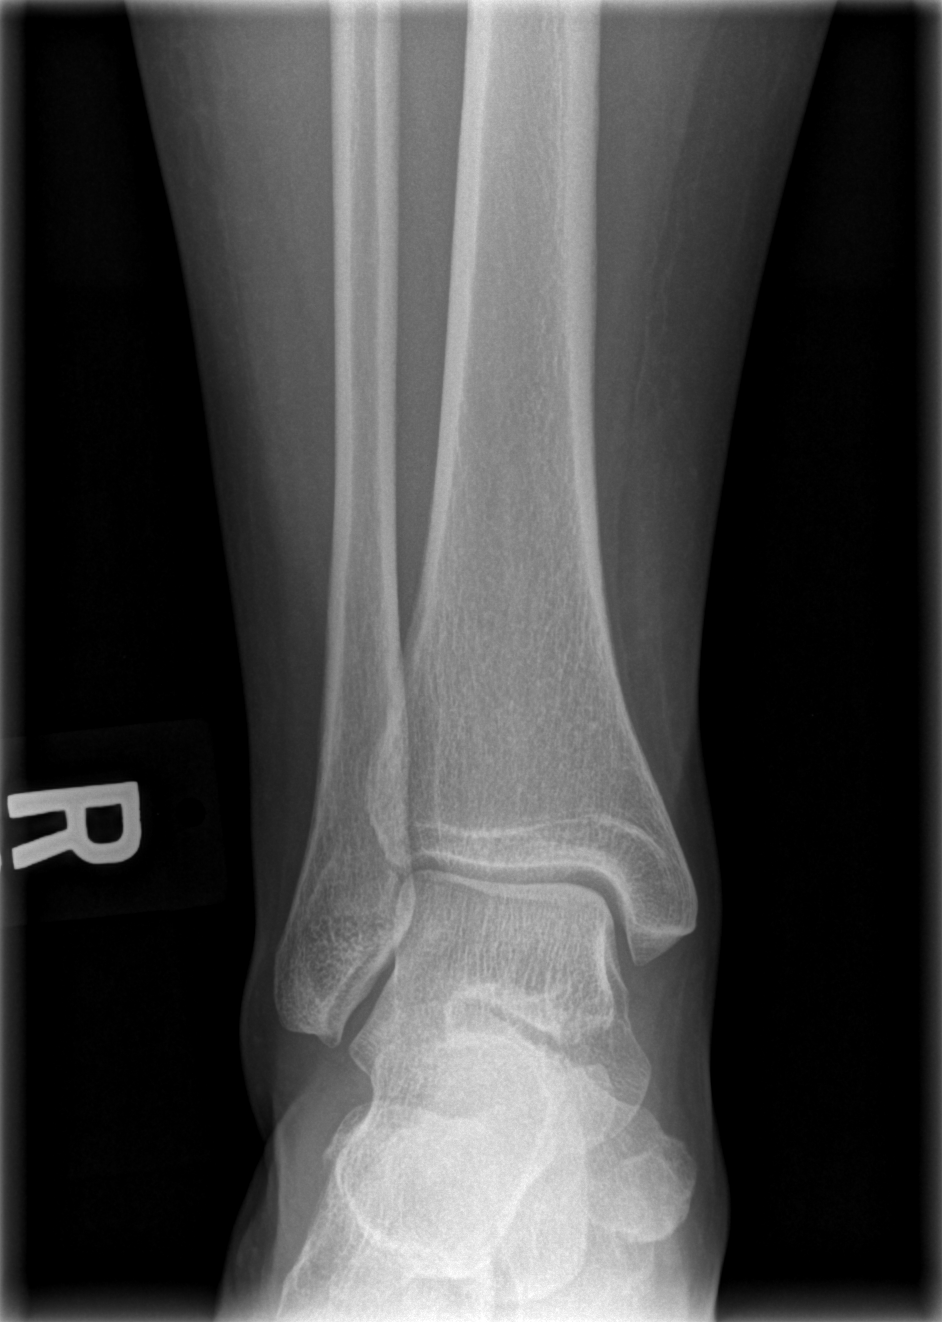

[t ankle joint oblique right]
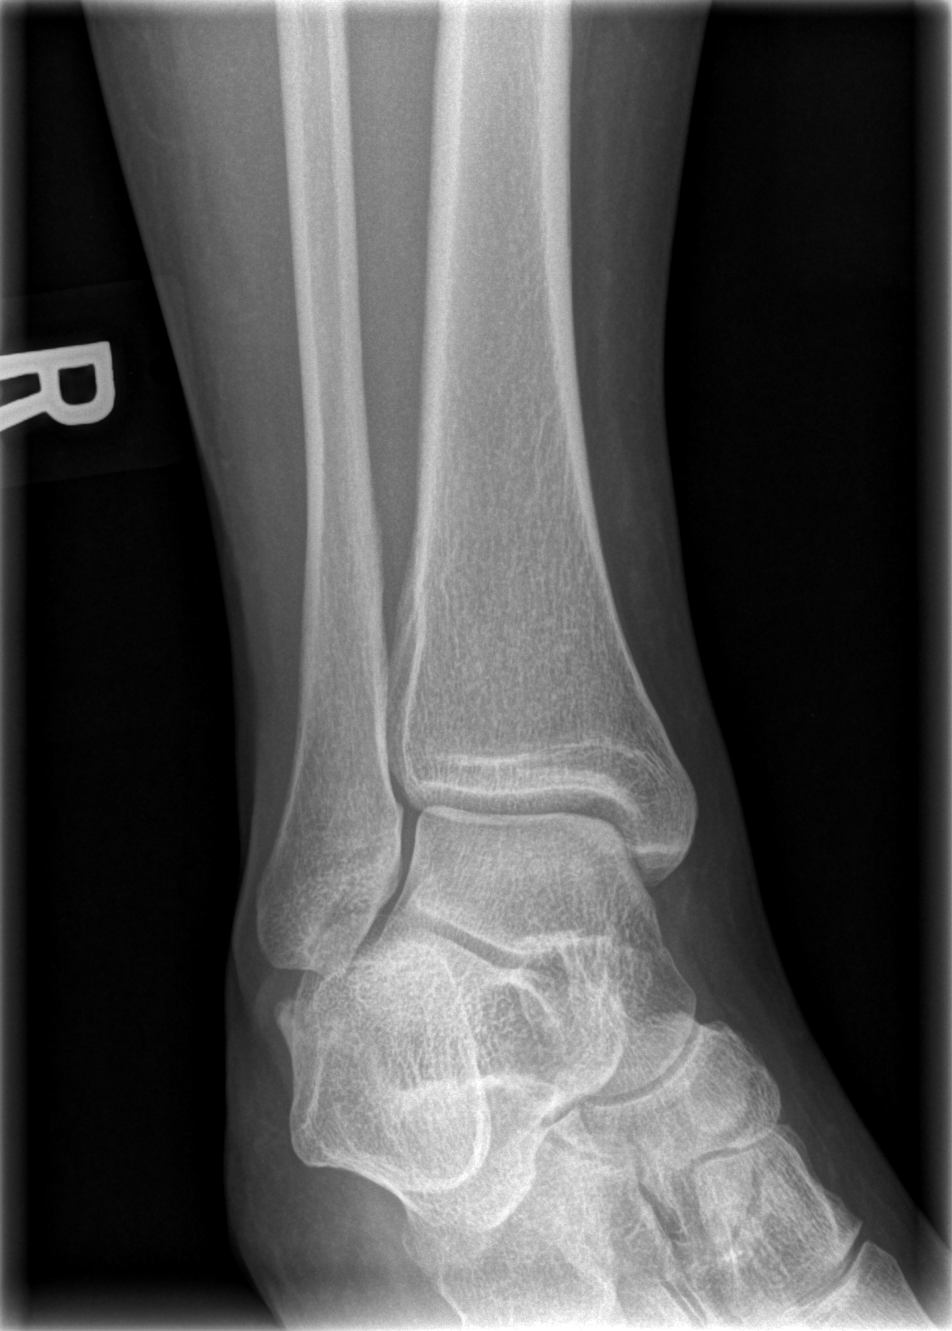

[t ankle joint lat right]
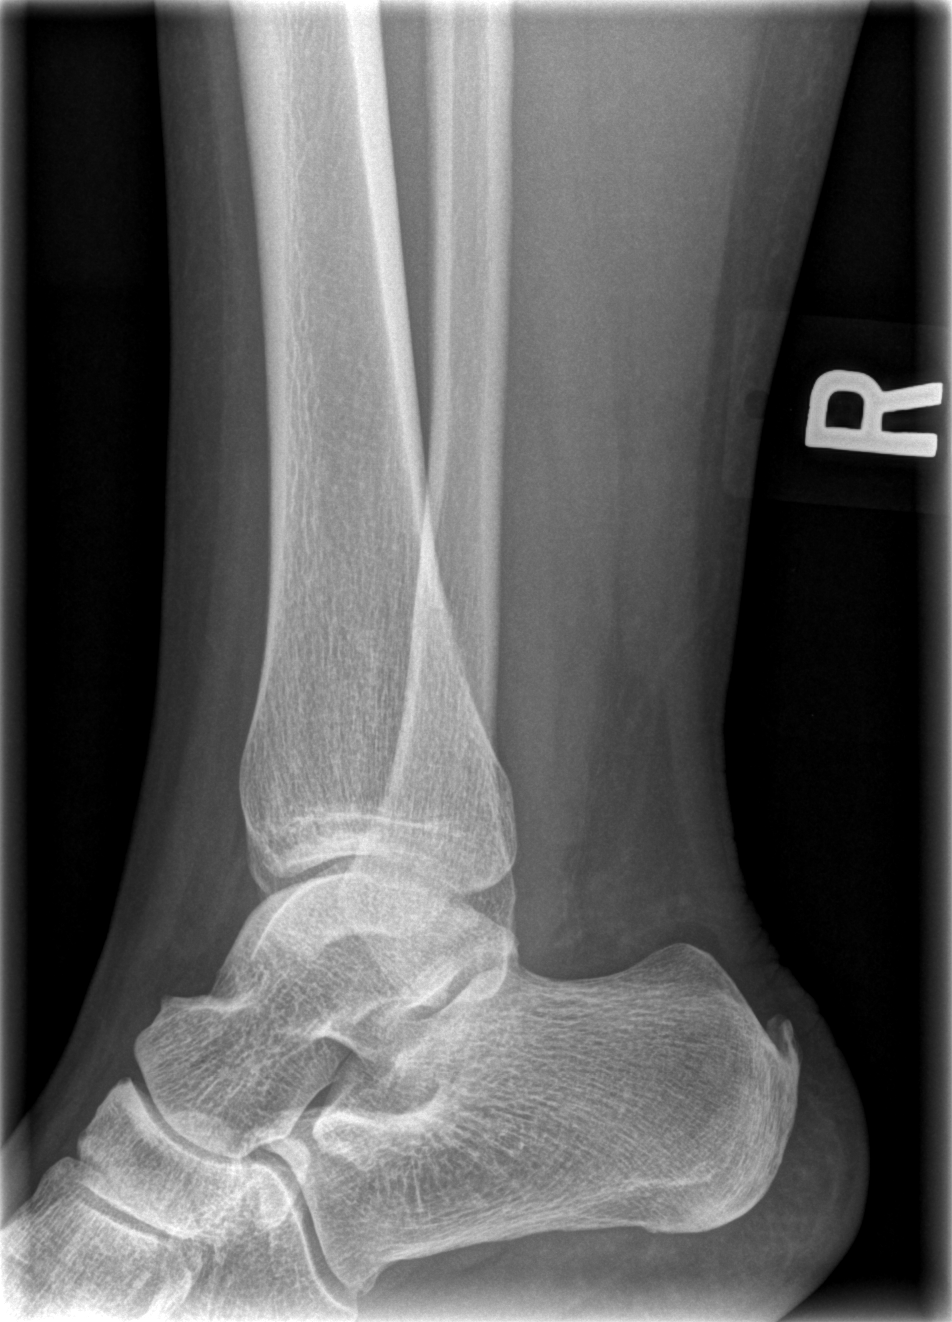

[3 of 3 positions shown; findings below may reference images not displayed]

FINDINGS: There is no evidence of fracture, dislocation, or joint effusion.
There is no evidence of arthropathy or other focal bone abnormality.
Soft tissues are unremarkable.
IMPRESSION: Negative.

## 2018-11-14 IMAGING — CR DG TIBIA/FIBULA 2V*R*
2 series · 2 of 2 positions shown · non-contrast
Comparison: 03/20/2017

CLINICAL DATA: Right lower leg pain and bruising

EXAM:
RIGHT TIBIA AND FIBULA - 2 VIEW

[t tib/fib ap right]
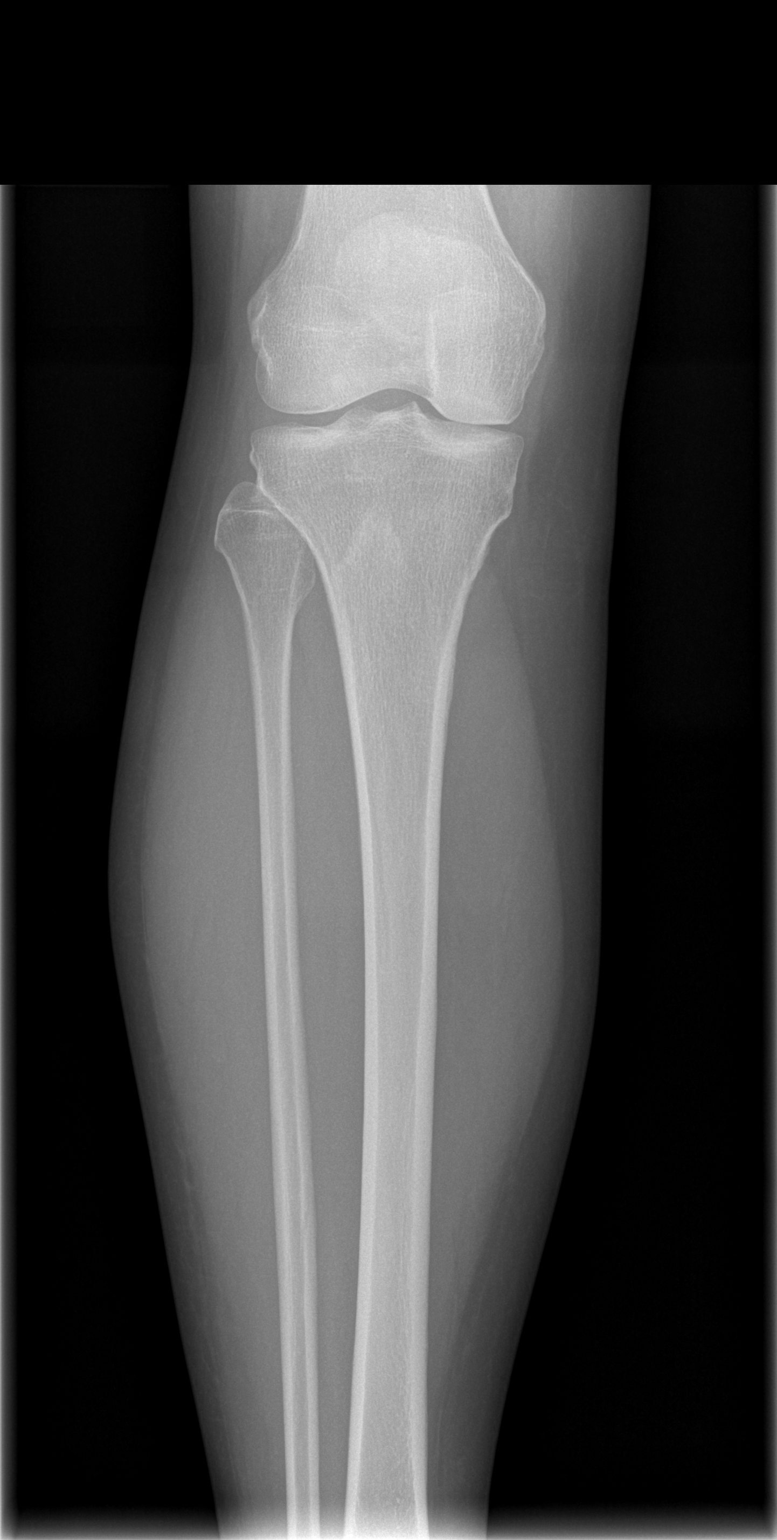

[t tib/fib lat right]
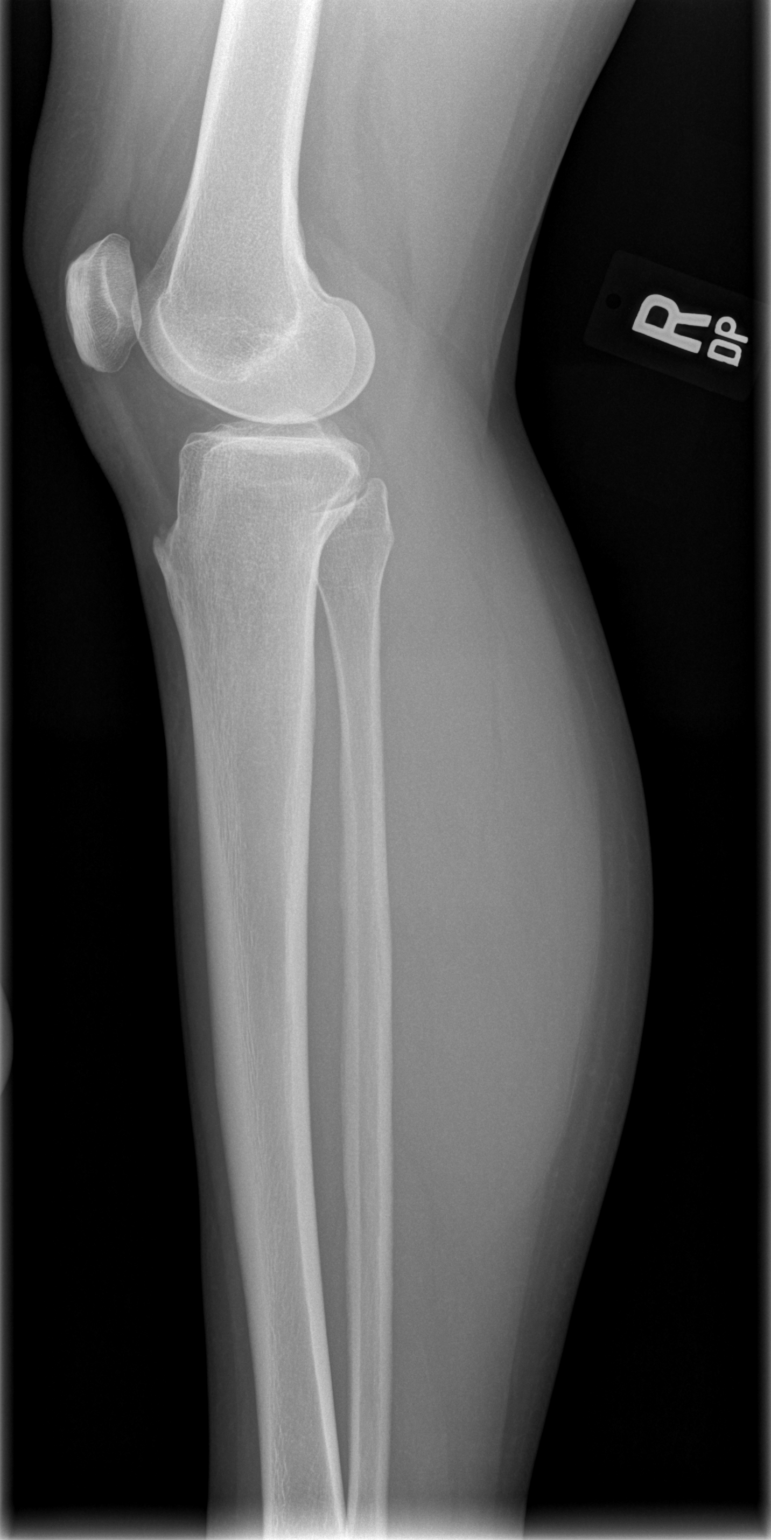

[2 of 2 positions shown; findings below may reference images not displayed]

FINDINGS: There is no evidence of fracture or other focal bone lesions. Soft
tissues are unremarkable.
IMPRESSION: Negative.

## 2019-01-01 DIAGNOSIS — N3010 Interstitial cystitis (chronic) without hematuria: Secondary | ICD-10-CM | POA: Diagnosis not present

## 2019-02-20 DIAGNOSIS — H52223 Regular astigmatism, bilateral: Secondary | ICD-10-CM | POA: Diagnosis not present

## 2019-03-03 DIAGNOSIS — Z1231 Encounter for screening mammogram for malignant neoplasm of breast: Secondary | ICD-10-CM | POA: Diagnosis not present

## 2019-03-03 LAB — HM MAMMOGRAPHY

## 2019-03-05 DIAGNOSIS — Z0100 Encounter for examination of eyes and vision without abnormal findings: Secondary | ICD-10-CM | POA: Diagnosis not present

## 2019-03-12 ENCOUNTER — Encounter: Payer: Self-pay | Admitting: Medical

## 2019-03-18 DIAGNOSIS — Z01419 Encounter for gynecological examination (general) (routine) without abnormal findings: Secondary | ICD-10-CM | POA: Diagnosis not present

## 2019-03-18 DIAGNOSIS — Z113 Encounter for screening for infections with a predominantly sexual mode of transmission: Secondary | ICD-10-CM | POA: Diagnosis not present

## 2019-03-18 DIAGNOSIS — Z114 Encounter for screening for human immunodeficiency virus [HIV]: Secondary | ICD-10-CM | POA: Diagnosis not present

## 2019-03-18 DIAGNOSIS — Z118 Encounter for screening for other infectious and parasitic diseases: Secondary | ICD-10-CM | POA: Diagnosis not present

## 2019-03-18 DIAGNOSIS — Z1159 Encounter for screening for other viral diseases: Secondary | ICD-10-CM | POA: Diagnosis not present

## 2019-03-21 ENCOUNTER — Other Ambulatory Visit: Payer: Self-pay | Admitting: Medical

## 2019-03-23 NOTE — Telephone Encounter (Signed)
Due for yearly physical, schedule please

## 2019-03-24 NOTE — Telephone Encounter (Signed)
lmom for patient to call the office to schedule a physical.

## 2019-03-25 ENCOUNTER — Encounter: Payer: Self-pay | Admitting: Medical

## 2019-03-25 ENCOUNTER — Other Ambulatory Visit: Payer: Self-pay

## 2019-03-25 ENCOUNTER — Ambulatory Visit (INDEPENDENT_AMBULATORY_CARE_PROVIDER_SITE_OTHER): Payer: Medicare HMO | Admitting: Medical

## 2019-03-25 VITALS — BP 102/68 | HR 85 | Temp 98.4°F | Ht 61.5 in | Wt 138.8 lb

## 2019-03-25 DIAGNOSIS — N3010 Interstitial cystitis (chronic) without hematuria: Secondary | ICD-10-CM

## 2019-03-25 DIAGNOSIS — Q613 Polycystic kidney, unspecified: Secondary | ICD-10-CM | POA: Diagnosis not present

## 2019-03-25 DIAGNOSIS — Z90710 Acquired absence of both cervix and uterus: Secondary | ICD-10-CM | POA: Diagnosis not present

## 2019-03-25 DIAGNOSIS — Z23 Encounter for immunization: Secondary | ICD-10-CM

## 2019-03-25 DIAGNOSIS — Z87891 Personal history of nicotine dependence: Secondary | ICD-10-CM

## 2019-03-25 DIAGNOSIS — Z13220 Encounter for screening for lipoid disorders: Secondary | ICD-10-CM | POA: Diagnosis not present

## 2019-03-25 DIAGNOSIS — J45909 Unspecified asthma, uncomplicated: Secondary | ICD-10-CM

## 2019-03-25 DIAGNOSIS — F419 Anxiety disorder, unspecified: Secondary | ICD-10-CM | POA: Diagnosis not present

## 2019-03-25 DIAGNOSIS — Z86718 Personal history of other venous thrombosis and embolism: Secondary | ICD-10-CM

## 2019-03-25 DIAGNOSIS — F5101 Primary insomnia: Secondary | ICD-10-CM

## 2019-03-25 DIAGNOSIS — Z8249 Family history of ischemic heart disease and other diseases of the circulatory system: Secondary | ICD-10-CM | POA: Diagnosis not present

## 2019-03-25 DIAGNOSIS — F329 Major depressive disorder, single episode, unspecified: Secondary | ICD-10-CM

## 2019-03-25 DIAGNOSIS — E8940 Asymptomatic postprocedural ovarian failure: Secondary | ICD-10-CM | POA: Diagnosis not present

## 2019-03-25 DIAGNOSIS — M797 Fibromyalgia: Secondary | ICD-10-CM

## 2019-03-25 DIAGNOSIS — E282 Polycystic ovarian syndrome: Secondary | ICD-10-CM

## 2019-03-25 DIAGNOSIS — E559 Vitamin D deficiency, unspecified: Secondary | ICD-10-CM | POA: Diagnosis not present

## 2019-03-25 DIAGNOSIS — Z0000 Encounter for general adult medical examination without abnormal findings: Secondary | ICD-10-CM | POA: Diagnosis not present

## 2019-03-25 DIAGNOSIS — Z8262 Family history of osteoporosis: Secondary | ICD-10-CM | POA: Diagnosis not present

## 2019-03-25 DIAGNOSIS — M1990 Unspecified osteoarthritis, unspecified site: Secondary | ICD-10-CM

## 2019-03-25 NOTE — Telephone Encounter (Signed)
Pt is in office being seen

## 2019-03-25 NOTE — Progress Notes (Signed)
Subjective:    Sharon Bowman is a 43 y.o. female who presents for Preventative Services visit and chronic medical problems/med check visit.    Primary Care Provider Tysinger, Camelia Eng, PA-C here for primary care  Current Health Care Team:  Dentist, Dr. Londell Moh doctor, Dr. Debbe Odea, PA and Dr. Estanislado Pandy, Rheumatology  Dr. Donato Heinz, nephrology  Dr. Alona Bene, Urology  Dr. Aloha Gell gynecology  Medical Services you may have received from other than [facility] providers in the past year (date may be approximate) Renal, nephrology   Exercise Current exercise habits: Home Exercise; stretching and squats at least 2 hours 7 days a week.    Nutrition/Diet Current diet: well balanced  Depression Screen Depression screen [facility] 2/9 03/25/2019  Decreased Interest 0  Down, Depressed, Hopeless 0  PHQ - 2 Score 0  Altered sleeping -  Tired, decreased energy -  Change in appetite -  Feeling bad or failure about yourself  -  Trouble concentrating -  Moving slowly or fidgety/restless -  Suicidal thoughts -  PHQ-9 Score -  Difficult doing work/chores -    Activities of Daily Living Screen/Functional Status Survey Is the patient deaf or have difficulty hearing?: No Does the patient have difficulty seeing, even when wearing glasses/contacts?: No Does the patient have difficulty concentrating, remembering, or making decisions?: Yes Does the patient have difficulty walking or climbing stairs?: Yes Does the patient have difficulty dressing or bathing?: No Does the patient have difficulty doing errands alone such as visiting a doctor's office or shopping?: No  Can patient draw a clock face showing 3:15 o'clock, yes  Fall Risk Screen Fall Risk  03/25/2019 06/01/2016 05/18/2016 05/12/2014  Falls in the past year? 0 No No No    Gait Assessment: Normal gait observed yes  Advanced directives Does patient have a [place]? No Does patient  have a Living Will? No  Past Medical History:  Diagnosis Date  . Allergic asthma 08/2015  . Anemia   . Anxiety   . Arthritis    osetoarthritis  . Chronic sinusitis   . Depression   . DVT (deep venous thrombosis) ([place]) 04/2014   right leg - behind calf, no treated with aspirin daily  . Endometriosis   . Family history of premature CAD   . Fibromyalgia 2017  . Former smoker   . GAD (generalized anxiety disorder)   . Headache   . History of PFTs 08/2015   normal  . Interstitial cystitis   . PCOS (polycystic ovarian syndrome)   . Polycystic kidney disease     Past Surgical History:  Procedure Laterality Date  . ABDOMINAL HYSTERECTOMY    . BLADDER SURGERY     BLADDER STRETCH X 3  . carpel radial tunnel  2014  . carpel tunnel     left  and right hand   . CYSTO WITH HYDRODISTENSION N/A 11/11/2014   Procedure: CYSTOSCOPY/HYDRODISTENSION MARCAINE AND PYRIDIUM AND Esmeralda Links;  Surgeon: Carolan Clines, MD;  Location: [facility];  Service: Urology;  Laterality: N/A;  . CYSTOSCOPY N/A 11/11/2014   Procedure: CYSTOSCOPY;  Surgeon: Aloha Gell, MD;  Location: [facility];  Service: Gynecology;  Laterality: N/A;  . CYSTOSCOPY WITH RETROGRADE PYELOGRAM, URETEROSCOPY AND STENT PLACEMENT Bilateral 11/11/2014   Procedure:  RETROGRADE PYELOGRAM with BILATERAL URETERAL CATHETHERS;  Surgeon: Carolan Clines, MD;  Location: [facility];  Service: Urology;  Laterality: Bilateral;  . IUD REMOVAL N/A 11/11/2014   Procedure: INTRAUTERINE DEVICE (  IUD) REMOVAL;  Surgeon: Aloha Gell, MD;  Location: [facility];  Service: Gynecology;  Laterality: N/A;  . LAPAROSCOPY     X 2 ENDOMETRIOSIS.  Marland Kitchen LEG SURGERY  04/2014   right knee - tumor - benighn  . polycystic kindey disease     [place] kidney(Coladonato)  . ROBOTIC ASSISTED LAPAROSCOPIC LYSIS OF ADHESION N/A 11/11/2014   Procedure: ROBOTIC ASSISTED LAPAROSCOPIC LYSIS OF ADHESION;  Surgeon: Aloha Gell, MD;  Location: [facility];  Service: Gynecology;  Laterality:  N/A;  . ROBOTIC ASSISTED TOTAL HYSTERECTOMY WITH BILATERAL SALPINGO OOPHERECTOMY Bilateral 11/11/2014   Procedure: ROBOTIC ASSISTED TOTAL HYSTERECTOMY WITH BILATERAL SALPINGO OOPHORECTOMY;  Surgeon: Aloha Gell, MD;  Location: [facility];  Service: Gynecology;  Laterality: Bilateral;  . TOOTH EXTRACTION      Social History   Socioeconomic History  . Marital status: Single    Spouse name: Not on file  . Number of children: Not on file  . Years of education: Not on file  . Highest education level: Not on file  Occupational History  . Not on file  Social Needs  . Financial resource strain: Not on file  . Food insecurity    Worry: Not on file    Inability: Not on file  . Transportation needs    Medical: Not on file    Non-medical: Not on file  Tobacco Use  . Smoking status: Former Smoker    Packs/day: 1.00    Years: 18.00    Pack years: 18.00    Types: Cigarettes    Quit date: 07/02/2014    Years since quitting: 4.7  . Smokeless tobacco: Never Used  Substance and Sexual Activity  . Alcohol use: Not Currently  . Drug use: Not Currently    Types: Marijuana, Codeine    Comment: marijuana use  - last use 2 yrs ago  . Sexual activity: Never    Birth control/protection: I.U.D.    Comment: Mirena  Lifestyle  . Physical activity    Days per week: Not on file    Minutes per session: Not on file  . Stress: Not on file  Relationships  . Social Herbalist on phone: Not on file    Gets together: Not on file    Attends religious service: Not on file    Active member of club or organization: Not on file    Attends meetings of clubs or organizations: Not on file    Relationship status: Not on file  . Intimate partner violence    Fear of current or ex partner: Not on file    Emotionally abused: Not on file    Physically abused: Not on file    Forced sexual activity: Not on file  Other Topics Concern  . Not on file  Social History Narrative  . Not on file    Family  History  Problem Relation Age of Onset  . Diabetes Mother   . Macular degeneration Mother   . Hyperlipidemia Mother   . Hypertension Mother   . Polycystic kidney disease Father   . Heart disease Father 52       MI, CABG  . Diabetes Father   . Hepatitis Father        C  . Hypothyroidism Father   . Hypertension Father   . Cancer Maternal Grandfather        bone  . Heart disease Paternal Grandmother   . Heart disease Paternal Grandfather      Current  Outpatient Medications:  .  amitriptyline (ELAVIL) 50 MG tablet, Take 1 tablet (50 mg total) by mouth at bedtime., Disp: 90 tablet, Rfl: 3 .  aspirin 81 MG tablet, Take 81 mg by mouth daily., Disp: , Rfl:  .  aspirin-acetaminophen-caffeine (EXCEDRIN MIGRAINE) 250-250-65 MG per tablet, Take 1 tablet by mouth every 6 (six) hours as needed for headache or migraine. Reported on 09/29/2015, Disp: , Rfl:  .  calcium-vitamin D (OSCAL WITH D) 500-200 MG-UNIT per tablet, Take 1 tablet by mouth daily.  , Disp: , Rfl:  .  Cyanocobalamin (B-12) 50 MCG TABS, Take 50 mcg by mouth daily. , Disp: , Rfl:  .  DULoxetine (CYMBALTA) 60 MG capsule, Take 1 capsule (60 mg total) by mouth 2 (two) times daily., Disp: 180 capsule, Rfl: 3 .  estradiol (CLIMARA - DOSED IN MG/24 HR) 0.1 mg/24hr patch, Place 0.1 mg onto the skin once a week., Disp: , Rfl:  .  hydrOXYzine (ATARAX/VISTARIL) 10 MG tablet, Take 1 tablet (10 mg total) by mouth 3 (three) times daily as needed., Disp: 270 tablet, Rfl: 1 .  Iron-Vitamin C (FE C PO), Take 1 tablet by mouth daily. , Disp: , Rfl:  .  Multiple Vitamin (MULTI-DAY VITAMINS PO), Take 1 tablet by mouth daily.  , Disp: , Rfl:  .  diclofenac sodium (VOLTAREN) 1 % GEL, Apply 2 g topically 4 (four) times daily. (Patient not taking: Reported on 03/25/2019), Disp: 100 g, Rfl: 1 .  LORazepam (ATIVAN) 0.5 MG tablet, Take 1 tablet (0.5 mg total) by mouth 2 (two) times daily as needed for anxiety. (Patient not taking: Reported on 03/25/2019), Disp:  10 tablet, Rfl: 0 .  LORazepam (ATIVAN) 0.5 MG tablet, Take 1 tablet (0.5 mg total) by mouth 2 (two) times daily as needed for anxiety. (Patient not taking: Reported on 03/25/2019), Disp: 12 tablet, Rfl: 0  Allergies  Allergen Reactions  . Latex Itching and Rash    History reviewed: allergies, current medications, past family history, past medical history, past social history, past surgical history and problem list  Chronic issues discussed: She notes loss of loss this past year.   Father and uncle passed away this past Christmas, has had 3 different cousins die this year.    Started getting lazy, has gained some weight.    Was on Elmiron, but it was expensive and now there is a class action lawsuit about this medication.  hasn't been on this in years.      Objective:      Biometrics BP 102/68   Pulse 85   Temp 98.4 F (36.9 C)   Ht 5' 1.5" (1.562 m)   Wt 138 lb 12.8 oz (63 kg)   LMP 11/01/2014 (Approximate)   SpO2 93%   BMI 25.80 kg/m   Wt Readings from Last 3 Encounters:  03/25/19 138 lb 12.8 oz (63 kg)  03/06/18 119 lb (54 kg)  10/15/17 117 lb (53.1 kg)     Cognitive Testing  Alert? Yes  Normal Appearance?Yes  Oriented to person? Yes  Place? Yes   Time? Yes  Recall of three objects?  Yes  Can perform simple calculations? Yes  Displays appropriate judgment?Yes  Can read the correct time from a watch face?Yes  General appearance: alert, no distress, WD/WN, white female  Nutritional Status: Inadequate calore intake? no Loss of muscle mass? no Loss of fat beneath skin? no Localized or general edema? no Diminished functional status? no  Other pertinent exam: HEENT: normocephalic, sclerae  anicteric, TMs pearly, nares patent, no discharge or erythema, pharynx normal Oral cavity: MMM, no lesions Neck: supple, no lymphadenopathy, no thyromegaly, no masses Heart: RRR, normal S1, S2, no murmurs Lungs: CTA bilaterally, no wheezes, rhonchi, or rales Abdomen:  +bs, soft, non tender, non distended, no masses, no hepatomegaly, no splenomegaly Musculoskeletal: nontender, no swelling, no obvious deformity Extremities: no edema, no cyanosis, no clubbing Pulses: 2+ symmetric, upper and lower extremities, normal cap refill Neurological: alert, oriented x 3, CN2-12 intact, strength normal upper extremities and lower extremities, sensation normal throughout, DTRs 2+ throughout, no cerebellar signs, gait normal Psychiatric: normal affect, behavior normal, pleasant    Assessment:   Encounter Diagnoses  Name Primary?  . Encounter for health maintenance examination in adult Yes  . Anxiety and depression   . History of DVT (deep vein thrombosis)   . Former smoker   . S/P hysterectomy   . Family history of premature CAD   . Family history of osteoporosis   . Surgical menopause   . Vitamin D deficiency   . Need for influenza vaccination   . Fibromyalgia   . Primary insomnia   . IC (interstitial cystitis)   . Polycystic kidney disease   . PCOS (polycystic ovarian syndrome)   . Extrinsic asthma without complication, unspecified asthma severity, unspecified whether persistent   . Osteoarthritis, unspecified osteoarthritis type, unspecified site      Plan:   A preventative services visit was completed today.  During the course of the visit today, we discussed and counseled about appropriate screening and preventive services.  A health risk assessment was established today that included a review of current medications, allergies, social history, family history, medical and preventative health history, biometrics, and preventative screenings to identify potential safety concerns or impairments.  A personalized plan was printed today for your records and use.   Personalized health advice and education was given today to reduce health risks and promote self management and wellness.  Information regarding end of life planning was discussed today.    Conditions/risks identified: No new significant concerns   Chronic problems discussed today: Continue routine f/u with urology, nephrology, rheumatology, gynecology  We will request pap records, reviewed mammogram records.    Recommendations:  I recommend a yearly ophthalmology/optometry visit for glaucoma screening and eye checkup  I recommended a yearly dental visit for hygiene and checkup  Advanced directives - discussed nature and purpose of Advanced Directives, encouraged them to complete them if they have not done so and/or encouraged them to get [place] a copy if they have done this already.  Referrals today: none  Immunizations: Counseled on the influenza virus vaccine.  Vaccine information sheet given.  Influenza vaccine given after consent obtained.   Up to date on Td, pneumococcal vaccines.  Sharon Bowman was seen today for [employer].  Diagnoses and all orders for this visit:  Encounter for health maintenance examination in adult -     VITAMIN D 25 Hydroxy (Vit-D Deficiency, Fractures) -     Renal Function Panel -     Hepatic function panel -     CBC with Differential/Platelet -     Lipid panel  Anxiety and depression  History of DVT (deep vein thrombosis)  Former smoker  S/P hysterectomy  Family history of premature CAD  Family history of osteoporosis  Surgical menopause  Vitamin D deficiency -     VITAMIN D 25 Hydroxy (Vit-D Deficiency, Fractures)  Need for influenza vaccination  Fibromyalgia  Primary insomnia  IC (interstitial cystitis) -     Renal Function Panel -     Hepatic function panel -     CBC with Differential/Platelet -     Lipid panel  Polycystic kidney disease -     Renal Function Panel -     Hepatic function panel -     CBC with Differential/Platelet -     Lipid panel  PCOS (polycystic ovarian syndrome)  Extrinsic asthma without complication, unspecified asthma severity, unspecified whether persistent  Osteoarthritis,  unspecified osteoarthritis type, unspecified site  Other orders -     Flu Vaccine QUAD 6+ mos PF IM (Fluarix Quad PF)      Medicare Attestation A preventative services visit was completed today.  During the course of the visit the patient was educated and counseled about appropriate screening and preventive services.  A health risk assessment was established with the patient that included a review of current medications, allergies, social history, family history, medical and preventative health history, biometrics, and preventative screenings to identify potential safety concerns or impairments.  A personalized plan was printed today for the patient's records and use.   Personalized health advice and education was given today to reduce health risks and promote self management and wellness.  Information regarding end of life planning was discussed today.  Dorothea Ogle, PA-C   03/25/2019

## 2019-03-26 LAB — HEPATIC FUNCTION PANEL
ALT: 24 IU/L (ref 0–32)
AST: 25 IU/L (ref 0–40)
Alkaline Phosphatase: 85 IU/L (ref 39–117)
Bilirubin Total: 0.5 mg/dL (ref 0.0–1.2)
Bilirubin, Direct: 0.12 mg/dL (ref 0.00–0.40)
Total Protein: 6.9 g/dL (ref 6.0–8.5)

## 2019-03-26 LAB — CBC WITH DIFFERENTIAL/PLATELET
Basophils Absolute: 0.1 10*3/uL (ref 0.0–0.2)
Basos: 1 %
EOS (ABSOLUTE): 0.2 10*3/uL (ref 0.0–0.4)
Eos: 1 %
Hematocrit: 40.9 % (ref 34.0–46.6)
Hemoglobin: 13 g/dL (ref 11.1–15.9)
Immature Grans (Abs): 0 10*3/uL (ref 0.0–0.1)
Immature Granulocytes: 0 %
Lymphocytes Absolute: 2.7 10*3/uL (ref 0.7–3.1)
Lymphs: 25 %
MCH: 29.7 pg (ref 26.6–33.0)
MCHC: 31.8 g/dL (ref 31.5–35.7)
MCV: 94 fL (ref 79–97)
Monocytes Absolute: 0.6 10*3/uL (ref 0.1–0.9)
Monocytes: 6 %
Neutrophils Absolute: 7 10*3/uL (ref 1.4–7.0)
Neutrophils: 67 %
Platelets: 367 10*3/uL (ref 150–450)
RBC: 4.37 x10E6/uL (ref 3.77–5.28)
RDW: 13.2 % (ref 11.7–15.4)
WBC: 10.5 10*3/uL (ref 3.4–10.8)

## 2019-03-26 LAB — LIPID PANEL
Chol/HDL Ratio: 4 ratio (ref 0.0–4.4)
Cholesterol, Total: 269 mg/dL — ABNORMAL HIGH (ref 100–199)
HDL: 67 mg/dL (ref >39)
LDL Chol Calc (NIH): 176 mg/dL — ABNORMAL HIGH (ref 0–99)
Triglycerides: 147 mg/dL (ref 0–149)
VLDL Cholesterol Cal: 26 mg/dL (ref 5–40)

## 2019-03-26 LAB — RENAL FUNCTION PANEL
Albumin: 4.4 g/dL (ref 3.8–4.8)
BUN/Creatinine Ratio: 11 (ref 9–23)
BUN: 9 mg/dL (ref 6–24)
CO2: 26 mmol/L (ref 20–29)
Calcium: 9.8 mg/dL (ref 8.7–10.2)
Chloride: 99 mmol/L (ref 96–106)
Creatinine, Ser: 0.81 mg/dL (ref 0.57–1.00)
GFR calc Af Amer: 103 mL/min/{1.73_m2} (ref >59)
GFR calc non Af Amer: 89 mL/min/{1.73_m2} (ref >59)
Glucose: 90 mg/dL (ref 65–99)
Phosphorus: 3.7 mg/dL (ref 3.0–4.3)
Potassium: 4.6 mmol/L (ref 3.5–5.2)
Sodium: 137 mmol/L (ref 134–144)

## 2019-03-26 LAB — VITAMIN D 25 HYDROXY (VIT D DEFICIENCY, FRACTURES): Vit D, 25-Hydroxy: 35.2 ng/mL (ref 30.0–100.0)

## 2019-04-06 DIAGNOSIS — E559 Vitamin D deficiency, unspecified: Secondary | ICD-10-CM | POA: Diagnosis not present

## 2019-04-06 DIAGNOSIS — D169 Benign neoplasm of bone and articular cartilage, unspecified: Secondary | ICD-10-CM | POA: Diagnosis not present

## 2019-04-06 DIAGNOSIS — Q613 Polycystic kidney, unspecified: Secondary | ICD-10-CM | POA: Diagnosis not present

## 2019-04-06 DIAGNOSIS — E7800 Pure hypercholesterolemia, unspecified: Secondary | ICD-10-CM | POA: Diagnosis not present

## 2019-04-06 DIAGNOSIS — R7303 Prediabetes: Secondary | ICD-10-CM | POA: Diagnosis not present

## 2019-04-06 DIAGNOSIS — E611 Iron deficiency: Secondary | ICD-10-CM | POA: Diagnosis not present

## 2019-04-06 DIAGNOSIS — Z720 Tobacco use: Secondary | ICD-10-CM | POA: Diagnosis not present

## 2019-04-06 DIAGNOSIS — N181 Chronic kidney disease, stage 1: Secondary | ICD-10-CM | POA: Diagnosis not present

## 2019-04-06 DIAGNOSIS — I82409 Acute embolism and thrombosis of unspecified deep veins of unspecified lower extremity: Secondary | ICD-10-CM | POA: Diagnosis not present

## 2019-05-08 ENCOUNTER — Telehealth: Payer: Self-pay | Admitting: Medical

## 2019-05-08 NOTE — Telephone Encounter (Signed)
[facility] called in reference to out request for medical records. They state pt has not had a pap since 2015.

## 2019-07-07 DIAGNOSIS — R350 Frequency of micturition: Secondary | ICD-10-CM | POA: Diagnosis not present

## 2019-07-07 DIAGNOSIS — N3010 Interstitial cystitis (chronic) without hematuria: Secondary | ICD-10-CM | POA: Diagnosis not present

## 2019-07-07 DIAGNOSIS — R3915 Urgency of urination: Secondary | ICD-10-CM | POA: Diagnosis not present

## 2019-07-07 DIAGNOSIS — M545 Low back pain: Secondary | ICD-10-CM | POA: Diagnosis not present

## 2019-07-07 DIAGNOSIS — R1030 Lower abdominal pain, unspecified: Secondary | ICD-10-CM | POA: Diagnosis not present

## 2019-07-07 DIAGNOSIS — M797 Fibromyalgia: Secondary | ICD-10-CM | POA: Diagnosis not present

## 2019-09-17 ENCOUNTER — Ambulatory Visit: Payer: Medicare HMO | Attending: Internal Medicine

## 2019-09-17 DIAGNOSIS — Z23 Encounter for immunization: Secondary | ICD-10-CM

## 2019-09-17 NOTE — Progress Notes (Signed)
   Covid-19 Vaccination Clinic  Name:  JANAEYA LYSON    MRN: [id number] DOB: 09/05/1975  09/17/2019  Sharon Bowman was observed post Covid-19 immunization for 15 minutes without incident. She was provided with Vaccine Information Sheet and instruction to access the V-Safe system.   Sharon Bowman was instructed to call 911 with any severe reactions post vaccine: Marland Kitchen Difficulty breathing  . Swelling of face and throat  . A fast heartbeat  . A bad rash all over body  . Dizziness and weakness   Immunizations Administered    Name Date Dose VIS Date Route   Pfizer COVID-19 Vaccine 09/17/2019  1:19 PM 0.3 mL 06/12/2019 Intramuscular   Manufacturer: Delta   Lot: [id number]   [place]: [id number]

## 2019-10-12 ENCOUNTER — Ambulatory Visit: Payer: Medicare HMO | Attending: Internal Medicine

## 2019-10-12 DIAGNOSIS — Z23 Encounter for immunization: Secondary | ICD-10-CM

## 2019-10-12 NOTE — Progress Notes (Signed)
   Covid-19 Vaccination Clinic  Name:  DEVYNNE RAMBERT    MRN: [id number] DOB: 01-Jul-1976  10/12/2019  Ms. Jarrell was observed post Covid-19 immunization for 15 minutes without incident. She was provided with Vaccine Information Sheet and instruction to access the V-Safe system.   Ms. Lavallie was instructed to call 911 with any severe reactions post vaccine: Marland Kitchen Difficulty breathing  . Swelling of face and throat  . A fast heartbeat  . A bad rash all over body  . Dizziness and weakness   Immunizations Administered    Name Date Dose VIS Date Route   Pfizer COVID-19 Vaccine 10/12/2019  2:29 PM 0.3 mL 06/12/2019 Intramuscular   Manufacturer: [place]   Lot: [id number]   [place]: [id number]

## 2020-02-09 DIAGNOSIS — E7800 Pure hypercholesterolemia, unspecified: Secondary | ICD-10-CM | POA: Diagnosis not present

## 2020-02-09 DIAGNOSIS — R7303 Prediabetes: Secondary | ICD-10-CM | POA: Diagnosis not present

## 2020-02-09 DIAGNOSIS — N181 Chronic kidney disease, stage 1: Secondary | ICD-10-CM | POA: Diagnosis not present

## 2020-02-09 DIAGNOSIS — Q613 Polycystic kidney, unspecified: Secondary | ICD-10-CM | POA: Diagnosis not present

## 2020-02-09 DIAGNOSIS — N3010 Interstitial cystitis (chronic) without hematuria: Secondary | ICD-10-CM | POA: Diagnosis not present

## 2020-02-09 DIAGNOSIS — E611 Iron deficiency: Secondary | ICD-10-CM | POA: Diagnosis not present

## 2020-03-03 ENCOUNTER — Other Ambulatory Visit: Payer: Self-pay | Admitting: Medical

## 2020-03-03 NOTE — Telephone Encounter (Signed)
Get in for yearly physical fasting in September when she is due.  In the meantime you can refill #60

## 2020-03-03 NOTE — Telephone Encounter (Signed)
Patient have physical for 04/04/20

## 2020-03-11 DIAGNOSIS — H52223 Regular astigmatism, bilateral: Secondary | ICD-10-CM | POA: Diagnosis not present

## 2020-03-17 DIAGNOSIS — Z0100 Encounter for examination of eyes and vision without abnormal findings: Secondary | ICD-10-CM | POA: Diagnosis not present

## 2020-04-04 ENCOUNTER — Ambulatory Visit (INDEPENDENT_AMBULATORY_CARE_PROVIDER_SITE_OTHER): Payer: Medicare HMO | Admitting: Medical

## 2020-04-04 ENCOUNTER — Encounter: Payer: Self-pay | Admitting: Medical

## 2020-04-04 ENCOUNTER — Other Ambulatory Visit: Payer: Self-pay

## 2020-04-04 VITALS — BP 100/74 | HR 71 | Ht 62.0 in | Wt 135.4 lb

## 2020-04-04 DIAGNOSIS — Z0000 Encounter for general adult medical examination without abnormal findings: Secondary | ICD-10-CM

## 2020-04-04 DIAGNOSIS — Z86718 Personal history of other venous thrombosis and embolism: Secondary | ICD-10-CM

## 2020-04-04 DIAGNOSIS — Z90710 Acquired absence of both cervix and uterus: Secondary | ICD-10-CM

## 2020-04-04 DIAGNOSIS — M797 Fibromyalgia: Secondary | ICD-10-CM

## 2020-04-04 DIAGNOSIS — Z13220 Encounter for screening for lipoid disorders: Secondary | ICD-10-CM | POA: Diagnosis not present

## 2020-04-04 DIAGNOSIS — J45909 Unspecified asthma, uncomplicated: Secondary | ICD-10-CM | POA: Diagnosis not present

## 2020-04-04 DIAGNOSIS — Z8249 Family history of ischemic heart disease and other diseases of the circulatory system: Secondary | ICD-10-CM

## 2020-04-04 DIAGNOSIS — M1990 Unspecified osteoarthritis, unspecified site: Secondary | ICD-10-CM

## 2020-04-04 DIAGNOSIS — Z23 Encounter for immunization: Secondary | ICD-10-CM

## 2020-04-04 DIAGNOSIS — F32A Depression, unspecified: Secondary | ICD-10-CM

## 2020-04-04 DIAGNOSIS — Q613 Polycystic kidney, unspecified: Secondary | ICD-10-CM | POA: Diagnosis not present

## 2020-04-04 DIAGNOSIS — F419 Anxiety disorder, unspecified: Secondary | ICD-10-CM

## 2020-04-04 DIAGNOSIS — Z87891 Personal history of nicotine dependence: Secondary | ICD-10-CM

## 2020-04-04 DIAGNOSIS — Z8262 Family history of osteoporosis: Secondary | ICD-10-CM

## 2020-04-04 DIAGNOSIS — F5101 Primary insomnia: Secondary | ICD-10-CM

## 2020-04-04 DIAGNOSIS — N3010 Interstitial cystitis (chronic) without hematuria: Secondary | ICD-10-CM

## 2020-04-04 DIAGNOSIS — E282 Polycystic ovarian syndrome: Secondary | ICD-10-CM

## 2020-04-04 DIAGNOSIS — E559 Vitamin D deficiency, unspecified: Secondary | ICD-10-CM

## 2020-04-04 DIAGNOSIS — E8940 Asymptomatic postprocedural ovarian failure: Secondary | ICD-10-CM

## 2020-04-04 NOTE — Progress Notes (Signed)
Medicare Wellness exam   Patient: Sharon Bowman   DOB: May 12, 1976   44 y.o. Female  MRN: [id number] Visit Date: 04/04/2020  Today's healthcare provider: Dorothea Ogle, PA-C   Chief Complaint  Patient presents with  . Medicare Wellness    with fasting labs  I,Porsha C McClurkin,acting as a scribe for [employer], PA-C.,have documented all relevant documentation on the behalf of Dorothea Ogle, PA-C,as directed by  [employer], PA-C while in the presence of [employer], PA-C.  Subjective    Sharon Bowman is a 44 y.o. female who presents today for a Medicare wellness exam.   HPI  HPI    Medicare Wellness     Additional comments: with fasting labs       Last edited by Edgar Frisk, [place] on 04/04/2020 11:06 AM. (History)     [facility]:02/09/20 and CMP was checked at the visit  Current Health Care Team:  Dentist, Dr. Londell Moh doctor, Dr. Debbe Odea, PA and Dr. Estanislado Pandy, Rheumatology (once a year)  Dr. Donato Heinz, nephrology  Dr. Alona Bene, Urology  Dr. Aloha Gell gynecology    She reports that she is doing okay.  No particular complaints today.  She notes compliance with medications.  She notes compliance with seeing her specialist.  He just saw her kidney doctor in August and had some labs.  She has not been exercising as much of late.  She notes that she went through a lot of mental health issues and depression in the past 2 years after losing father, and several other family members in 1 year's time  Although she denies depression or mood changes today she seems down.  She also notes not having a great relationship with her mother and sisters    Past Medical History:  Diagnosis Date  . Allergic asthma 08/2015  . Anemia   . Anxiety   . Arthritis    osetoarthritis  . Chronic sinusitis   . Depression   . DVT (deep venous thrombosis) ([place]) 04/2014   right leg - behind calf, no treated with aspirin daily    . Endometriosis   . Family history of premature CAD   . Fibromyalgia 2017  . Former smoker   . GAD (generalized anxiety disorder)   . Headache   . History of PFTs 08/2015   normal  . Interstitial cystitis   . PCOS (polycystic ovarian syndrome)   . Polycystic kidney disease    Past Surgical History:  Procedure Laterality Date  . ABDOMINAL HYSTERECTOMY    . BLADDER SURGERY     BLADDER STRETCH X 3  . carpel radial tunnel  2014  . carpel tunnel     left  and right hand   . CYSTO WITH HYDRODISTENSION N/A 11/11/2014   Procedure: CYSTOSCOPY/HYDRODISTENSION MARCAINE AND PYRIDIUM AND Esmeralda Links;  Surgeon: Carolan Clines, MD;  Location: [facility];  Service: Urology;  Laterality: N/A;  . CYSTOSCOPY N/A 11/11/2014   Procedure: CYSTOSCOPY;  Surgeon: Aloha Gell, MD;  Location: [facility];  Service: Gynecology;  Laterality: N/A;  . CYSTOSCOPY WITH RETROGRADE PYELOGRAM, URETEROSCOPY AND STENT PLACEMENT Bilateral 11/11/2014   Procedure:  RETROGRADE PYELOGRAM with BILATERAL URETERAL CATHETHERS;  Surgeon: Carolan Clines, MD;  Location: [facility];  Service: Urology;  Laterality: Bilateral;  . IUD REMOVAL N/A 11/11/2014   Procedure: INTRAUTERINE DEVICE (IUD) REMOVAL;  Surgeon: Aloha Gell, MD;  Location: [facility];  Service: Gynecology;  Laterality: N/A;  . LAPAROSCOPY  X 2 ENDOMETRIOSIS.  Marland Kitchen LEG SURGERY  04/2014   right knee - tumor - benighn  . polycystic kindey disease     [facility]([place])  . ROBOTIC ASSISTED LAPAROSCOPIC LYSIS OF ADHESION N/A 11/11/2014   Procedure: ROBOTIC ASSISTED LAPAROSCOPIC LYSIS OF ADHESION;  Surgeon: Aloha Gell, MD;  Location: [facility];  Service: Gynecology;  Laterality: N/A;  . ROBOTIC ASSISTED TOTAL HYSTERECTOMY WITH BILATERAL SALPINGO OOPHERECTOMY Bilateral 11/11/2014   Procedure: ROBOTIC ASSISTED TOTAL HYSTERECTOMY WITH BILATERAL SALPINGO OOPHORECTOMY;  Surgeon: Aloha Gell, MD;  Location: [facility];  Service: Gynecology;  Laterality: Bilateral;  . TOOTH  EXTRACTION     Social History   Socioeconomic History  . Marital status: Single    Spouse name: Not on file  . Number of children: Not on file  . Years of education: Not on file  . Highest education level: Not on file  Occupational History  . Not on file  Tobacco Use  . Smoking status: Former Smoker    Packs/day: 1.00    Years: 18.00    Pack years: 18.00    Types: Cigarettes    Quit date: 07/02/2014    Years since quitting: 5.7  . Smokeless tobacco: Never Used  Vaping Use  . Vaping Use: Never used  Substance and Sexual Activity  . Alcohol use: Not Currently  . Drug use: Not Currently    Types: Marijuana, Codeine    Comment: marijuana use  - last use 2 yrs ago  . Sexual activity: Never    Birth control/protection: I.U.D.    Comment: Mirena  Other Topics Concern  . Not on file  Social History Narrative  . Not on file   Social Determinants of Health   Financial Resource Strain:   . Difficulty of Paying Living Expenses: Not on file  Food Insecurity:   . Worried About Charity fundraiser in the Last Year: Not on file  . Ran Out of Food in the Last Year: Not on file  Transportation Needs:   . Lack of Transportation (Medical): Not on file  . Lack of Transportation (Non-Medical): Not on file  Physical Activity:   . Days of Exercise per Week: Not on file  . Minutes of Exercise per Session: Not on file  Stress:   . Feeling of Stress : Not on file  Social Connections:   . Frequency of Communication with Friends and Family: Not on file  . Frequency of Social Gatherings with Friends and Family: Not on file  . Attends Religious Services: Not on file  . Active Member of Clubs or Organizations: Not on file  . Attends Archivist Meetings: Not on file  . Marital Status: Not on file  Intimate Partner Violence:   . Fear of Current or Ex-Partner: Not on file  . Emotionally Abused: Not on file  . Physically Abused: Not on file  . Sexually Abused: Not on file   Family  Status  Relation Name Status  . Mother  Alive  . Father  Alive  . MGF  Deceased  . Sister  Alive  . Sister  Alive  . PGM  Deceased  . PGF  Deceased   Family History  Problem Relation Age of Onset  . Diabetes Mother   . Macular degeneration Mother   . Hyperlipidemia Mother   . Hypertension Mother   . Polycystic kidney disease Father   . Heart disease Father 3       MI, CABG  . Diabetes  Father   . Hepatitis Father        C  . Hypothyroidism Father   . Hypertension Father   . Cancer Maternal Grandfather        bone  . Heart disease Paternal Grandmother   . Heart disease Paternal Grandfather    Allergies  Allergen Reactions  . Latex Itching and Rash    Patient Care Team: Lionell Matuszak, Camelia Eng, PA-C as PCP - General (Family Medicine)   Medications: Outpatient Medications Prior to Visit  Medication Sig  . amitriptyline (ELAVIL) 50 MG tablet Take 1 tablet (50 mg total) by mouth at bedtime.  Marland Kitchen aspirin 81 MG tablet Take 81 mg by mouth daily.  . calcium-vitamin D (OSCAL WITH D) 500-200 MG-UNIT per tablet Take 1 tablet by mouth daily.    . Cyanocobalamin (B-12) 50 MCG TABS Take 50 mcg by mouth daily.   . DULoxetine (CYMBALTA) 60 MG capsule TAKE 1 CAPSULE BY MOUTH TWICE A DAY  . estradiol (CLIMARA - DOSED IN MG/24 HR) 0.1 mg/24hr patch Place 0.1 mg onto the skin once a week.  . hydrOXYzine (ATARAX/VISTARIL) 10 MG tablet Take 1 tablet (10 mg total) by mouth 3 (three) times daily as needed.  . Iron-Vitamin C (FE C PO) Take 1 tablet by mouth daily.   . Multiple Vitamin (MULTI-DAY VITAMINS PO) Take 1 tablet by mouth daily.    Marland Kitchen aspirin-acetaminophen-caffeine (EXCEDRIN MIGRAINE) 250-250-65 MG per tablet Take 1 tablet by mouth every 6 (six) hours as needed for headache or migraine. Reported on 09/29/2015 (Patient not taking: Reported on 04/04/2020)  . diclofenac sodium (VOLTAREN) 1 % GEL Apply 2 g topically 4 (four) times daily. (Patient not taking: Reported on 03/25/2019)  . LORazepam  (ATIVAN) 0.5 MG tablet Take 1 tablet (0.5 mg total) by mouth 2 (two) times daily as needed for anxiety. (Patient not taking: Reported on 03/25/2019)  . LORazepam (ATIVAN) 0.5 MG tablet Take 1 tablet (0.5 mg total) by mouth 2 (two) times daily as needed for anxiety. (Patient not taking: Reported on 03/25/2019)   No facility-administered medications prior to visit.    Review of Systems  Review of Systems Constitutional: -fever, -chills, -sweats, -unexpected weight change,-fatigue ENT: -runny nose, -ear pain, -sore throat Cardiology:  -chest pain, -palpitations, -edema Respiratory: -cough, -shortness of breath, -wheezing Gastroenterology: -abdominal pain, -nausea, -vomiting, -diarrhea, -constipation Hematology: -bleeding or bruising problems Musculoskeletal: -arthralgias, -myalgias, -joint swelling, -back pain Ophthalmology: -vision changes Urology: -dysuria, -difficulty urinating, -hematuria, -urinary frequency, -urgency Neurology: -headache, -weakness, -tingling, -numbness     Objective    BP 100/74   Pulse 71   Ht 5\' 2"  (1.575 m)   Wt 135 lb 6.4 oz (61.4 kg)   LMP 11/01/2014 (Approximate)   SpO2 91%   BMI 24.76 kg/m    Physical Exam Constitutional:      Appearance: Normal appearance.  HENT:     Head: Normocephalic.     Right Ear: Tympanic membrane, ear canal and external ear normal. There is impacted cerumen.     Left Ear: Tympanic membrane, ear canal and external ear normal.     Nose: Nose normal.  Eyes:     Extraocular Movements: Extraocular movements intact.     Conjunctiva/sclera: Conjunctivae normal.     Pupils: Pupils are equal, round, and reactive to light.  Cardiovascular:     Rate and Rhythm: Normal rate and regular rhythm.     Pulses: Normal pulses.     Heart sounds: Normal heart sounds. No murmur heard.  Pulmonary:     Effort: Pulmonary effort is normal.     Breath sounds: Normal breath sounds. No wheezing, rhonchi or rales.  Abdominal:     General:  Abdomen is flat. Bowel sounds are normal. There is no distension.     Palpations: Abdomen is soft. There is no mass.     Tenderness: There is no abdominal tenderness. There is no guarding or rebound.     Hernia: No hernia is present.  Musculoskeletal:        General: No swelling or deformity.     Right wrist: Normal. Normal pulse.     Left wrist: Normal. Normal pulse.     Cervical back: Normal range of motion and neck supple. No tenderness.     Right lower leg: No edema.     Left lower leg: No edema.  Lymphadenopathy:     Cervical: No cervical adenopathy.  Skin:    General: Skin is warm and dry.     Capillary Refill: Capillary refill takes less than 2 seconds.  Neurological:     General: No focal deficit present.     Mental Status: She is alert and oriented to person, place, and time. Mental status is at baseline.  Psychiatric:        Mood and Affect: Mood normal.        Behavior: Behavior normal.       Last depression screening scores PHQ 2/9 Scores 04/04/2020 03/25/2019 06/01/2016  PHQ - 2 Score 0 0 4  PHQ- 9 Score - - 12   Last fall risk screening Fall Risk  04/04/2020  Falls in the past year? 0      Assessment & Plan     Encounter Diagnoses  Name Primary?  . Encounter for health maintenance examination in adult Yes  . Medicare annual wellness visit, subsequent   . Anxiety and depression   . Extrinsic asthma without complication, unspecified asthma severity, unspecified whether persistent   . PCOS (polycystic ovarian syndrome)   . Osteoarthritis, unspecified osteoarthritis type, unspecified site   . IC (interstitial cystitis)   . Polycystic kidney disease   . Fibromyalgia   . Primary insomnia   . Screening for lipid disorders   . History of DVT (deep vein thrombosis)   . Former smoker   . S/P hysterectomy   . Family history of premature CAD   . Family history of osteoporosis   . Surgical menopause   . Vitamin D deficiency   . Need for influenza vaccination       Exercise Activities and Dietary recommendations Goals   None     Immunization History  Administered Date(s) Administered  . Influenza Split 03/21/2012  . Influenza Whole 04/01/2009, 03/14/2010, 02/28/2011  . Influenza,inj,Quad PF,6+ Mos 03/26/2013, 03/23/2015, 03/29/2016, 03/19/2017, 03/06/2018, 03/25/2019  . PFIZER SARS-COV-2 Vaccination 09/17/2019, 10/12/2019  . Pneumococcal Conjugate-13 07/28/2009  . Tdap 09/29/2015    Health Maintenance  Topic Date Due  . PAP SMEAR-Modifier  11/29/2014  . INFLUENZA VACCINE  01/31/2020  . TETANUS/TDAP  09/28/2025  . COVID-19 Vaccine  Completed  . Hepatitis C Screening  Completed  . HIV Screening  Completed    Discussed health benefits of physical activity, and encouraged her to engage in regular exercise appropriate for her age and condition.   Physical exam - discussed and counseled on healthy lifestyle, diet, exercise, preventative care, vaccinations, sick and well care, proper use of emergency dept and after hours care, and addressed their concerns.  Health screening: See your eye doctor yearly for routine vision care. See your dentist yearly for routine dental care including hygiene visits twice yearly. She continues to see her gynecologist and specialist regularly   Cancer screening Advised monthly self testicular exam  Colonoscopy:  We will plan age 65 years old   Status post hysterectomy, no longer needs Paps    Vaccinations: She is up-to-date on Covid and tetanus vaccines  Counseled on the influenza virus vaccine.  Vaccine information sheet given.  Influenza vaccine given after consent obtained.      Acute issues discussed: None  Separate significant chronic issues discussed: Insomnia-stable on current medication  Polycystic kidney disease-reviewed recent kidney doctor notes  Premature CAD family history, last year LDL elevated-counseled on potential need for statin, healthy low-cholesterol diet.   Repeat labs today  Vitamin D deficiency-repeat labs today.  She is not currently taking supplement although advised to last year  Anxiety and depression history-reviewed PHQ-9 but I doubt she answered this honestly.  She seems to be down in general.  I recommended she consider counseling  Arthritis-sees rheumatology  Interstitial cystitis-sees urology    Teya was seen today for medicare wellness.  Diagnoses and all orders for this visit:  Encounter for health maintenance examination in adult -     CBC with Differential/Platelet -     VITAMIN D 25 Hydroxy (Vit-D Deficiency, Fractures) -     Lipid panel  Medicare annual wellness visit, subsequent  Anxiety and depression  Extrinsic asthma without complication, unspecified asthma severity, unspecified whether persistent  PCOS (polycystic ovarian syndrome)  Osteoarthritis, unspecified osteoarthritis type, unspecified site  IC (interstitial cystitis) -     CBC with Differential/Platelet  Polycystic kidney disease  Fibromyalgia  Primary insomnia  Screening for lipid disorders -     Lipid panel  History of DVT (deep vein thrombosis)  Former smoker  S/P hysterectomy  Family history of premature CAD  Family history of osteoporosis  Surgical menopause  Vitamin D deficiency -     VITAMIN D 25 Hydroxy (Vit-D Deficiency, Fractures)  Need for influenza vaccination    Follow-up pending labs, yearly for physical     Dorothea Ogle, Hershal Coria  [place] [phone] (phone) [phone] (fax)  [place]

## 2020-04-05 LAB — CBC WITH DIFFERENTIAL/PLATELET
Basophils Absolute: 0.1 10*3/uL (ref 0.0–0.2)
Basos: 1 %
EOS (ABSOLUTE): 0.2 10*3/uL (ref 0.0–0.4)
Eos: 2 %
Hematocrit: 36.6 % (ref 34.0–46.6)
Hemoglobin: 12.1 g/dL (ref 11.1–15.9)
Immature Grans (Abs): 0 10*3/uL (ref 0.0–0.1)
Immature Granulocytes: 0 %
Lymphocytes Absolute: 2.7 10*3/uL (ref 0.7–3.1)
Lymphs: 29 %
MCH: 30.4 pg (ref 26.6–33.0)
MCHC: 33.1 g/dL (ref 31.5–35.7)
MCV: 92 fL (ref 79–97)
Monocytes Absolute: 0.6 10*3/uL (ref 0.1–0.9)
Monocytes: 6 %
Neutrophils Absolute: 5.9 10*3/uL (ref 1.4–7.0)
Neutrophils: 62 %
Platelets: 366 10*3/uL (ref 150–450)
RBC: 3.98 x10E6/uL (ref 3.77–5.28)
RDW: 13.1 % (ref 11.7–15.4)
WBC: 9.5 10*3/uL (ref 3.4–10.8)

## 2020-04-05 LAB — LIPID PANEL
Chol/HDL Ratio: 4.1 ratio (ref 0.0–4.4)
Cholesterol, Total: 247 mg/dL — ABNORMAL HIGH (ref 100–199)
HDL: 60 mg/dL (ref >39)
LDL Chol Calc (NIH): 164 mg/dL — ABNORMAL HIGH (ref 0–99)
Triglycerides: 131 mg/dL (ref 0–149)
VLDL Cholesterol Cal: 23 mg/dL (ref 5–40)

## 2020-04-05 LAB — VITAMIN D 25 HYDROXY (VIT D DEFICIENCY, FRACTURES): Vit D, 25-Hydroxy: 36.1 ng/mL (ref 30.0–100.0)

## 2020-04-15 DIAGNOSIS — H16223 Keratoconjunctivitis sicca, not specified as Sjogren's, bilateral: Secondary | ICD-10-CM | POA: Diagnosis not present

## 2020-04-15 DIAGNOSIS — H02883 Meibomian gland dysfunction of right eye, unspecified eyelid: Secondary | ICD-10-CM | POA: Diagnosis not present

## 2020-04-15 DIAGNOSIS — H02886 Meibomian gland dysfunction of left eye, unspecified eyelid: Secondary | ICD-10-CM | POA: Diagnosis not present

## 2020-04-19 DIAGNOSIS — Z118 Encounter for screening for other infectious and parasitic diseases: Secondary | ICD-10-CM | POA: Diagnosis not present

## 2020-04-19 DIAGNOSIS — Z1231 Encounter for screening mammogram for malignant neoplasm of breast: Secondary | ICD-10-CM | POA: Diagnosis not present

## 2020-04-19 DIAGNOSIS — Z113 Encounter for screening for infections with a predominantly sexual mode of transmission: Secondary | ICD-10-CM | POA: Diagnosis not present

## 2020-04-19 DIAGNOSIS — Z01419 Encounter for gynecological examination (general) (routine) without abnormal findings: Secondary | ICD-10-CM | POA: Diagnosis not present

## 2020-04-19 DIAGNOSIS — Z114 Encounter for screening for human immunodeficiency virus [HIV]: Secondary | ICD-10-CM | POA: Diagnosis not present

## 2020-04-19 DIAGNOSIS — Z1159 Encounter for screening for other viral diseases: Secondary | ICD-10-CM | POA: Diagnosis not present

## 2020-04-19 LAB — HM MAMMOGRAPHY

## 2020-04-28 ENCOUNTER — Telehealth: Payer: Self-pay

## 2020-04-28 ENCOUNTER — Other Ambulatory Visit: Payer: Self-pay | Admitting: Medical

## 2020-04-28 MED ORDER — ROSUVASTATIN CALCIUM 5 MG PO TABS: 5.0000 mg | ORAL_TABLET | Freq: Every day | ORAL | 3 refills | Status: DC

## 2020-04-28 NOTE — Telephone Encounter (Signed)
I never got a reply that she was agreeable.  I sent the medication.  Plan 3 month follow up fasting after starting the medication.

## 2020-04-28 NOTE — Telephone Encounter (Signed)
Pt. Aware that medication has been sent in and she has been scheduled for a 3 month fasting f/u apt.

## 2020-04-28 NOTE — Telephone Encounter (Signed)
Received fax from pts. pharmacy stating that the pt. Checked with them about a cholesterol medication that was supposed to be sent in. They said they never received a prescription for a cholesterol medication for her from [place].

## 2020-05-06 ENCOUNTER — Encounter: Payer: Self-pay | Admitting: Medical

## 2020-05-10 ENCOUNTER — Encounter: Payer: Self-pay | Admitting: Medical

## 2020-07-07 DIAGNOSIS — N393 Stress incontinence (female) (male): Secondary | ICD-10-CM | POA: Diagnosis not present

## 2020-07-07 DIAGNOSIS — N3010 Interstitial cystitis (chronic) without hematuria: Secondary | ICD-10-CM | POA: Diagnosis not present

## 2020-07-28 ENCOUNTER — Other Ambulatory Visit: Payer: Self-pay

## 2020-07-28 ENCOUNTER — Ambulatory Visit (INDEPENDENT_AMBULATORY_CARE_PROVIDER_SITE_OTHER): Payer: Medicare HMO | Admitting: Medical

## 2020-07-28 ENCOUNTER — Encounter: Payer: Self-pay | Admitting: Medical

## 2020-07-28 VITALS — BP 122/80 | HR 74 | Ht 62.0 in | Wt 132.4 lb

## 2020-07-28 DIAGNOSIS — F419 Anxiety disorder, unspecified: Secondary | ICD-10-CM | POA: Diagnosis not present

## 2020-07-28 DIAGNOSIS — F32A Depression, unspecified: Secondary | ICD-10-CM | POA: Diagnosis not present

## 2020-07-28 DIAGNOSIS — E785 Hyperlipidemia, unspecified: Secondary | ICD-10-CM | POA: Diagnosis not present
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
## 2020-07-29 ENCOUNTER — Other Ambulatory Visit: Payer: Self-pay | Admitting: Medical

## 2020-07-29 LAB — LIPID PANEL
Chol/HDL Ratio: 2.9 ratio (ref 0.0–4.4)
Cholesterol, Total: 187 mg/dL (ref 100–199)
HDL: 64 mg/dL (ref >39)
LDL Chol Calc (NIH): 104 mg/dL — ABNORMAL HIGH (ref 0–99)
Triglycerides: 104 mg/dL (ref 0–149)
VLDL Cholesterol Cal: 19 mg/dL (ref 5–40)

## 2020-07-29 LAB — ALT: ALT: 29 IU/L (ref 0–32)

## 2020-07-29 NOTE — Progress Notes (Signed)
Left detail message on machine for patient. 

## 2020-09-29 DIAGNOSIS — M7918 Myalgia, other site: Secondary | ICD-10-CM | POA: Diagnosis not present

## 2020-09-29 DIAGNOSIS — Q613 Polycystic kidney, unspecified: Secondary | ICD-10-CM | POA: Diagnosis not present

## 2020-09-29 DIAGNOSIS — N3010 Interstitial cystitis (chronic) without hematuria: Secondary | ICD-10-CM | POA: Diagnosis not present

## 2020-09-29 DIAGNOSIS — N809 Endometriosis, unspecified: Secondary | ICD-10-CM | POA: Diagnosis not present

## 2020-09-29 DIAGNOSIS — R3915 Urgency of urination: Secondary | ICD-10-CM | POA: Diagnosis not present

## 2020-09-29 DIAGNOSIS — M729 Fibroblastic disorder, unspecified: Secondary | ICD-10-CM | POA: Diagnosis not present

## 2020-09-29 DIAGNOSIS — R350 Frequency of micturition: Secondary | ICD-10-CM | POA: Diagnosis not present

## 2020-11-01 DIAGNOSIS — M797 Fibromyalgia: Secondary | ICD-10-CM | POA: Diagnosis not present

## 2020-11-01 DIAGNOSIS — N3010 Interstitial cystitis (chronic) without hematuria: Secondary | ICD-10-CM | POA: Diagnosis not present

## 2020-11-01 DIAGNOSIS — Q613 Polycystic kidney, unspecified: Secondary | ICD-10-CM | POA: Diagnosis not present

## 2020-11-01 DIAGNOSIS — R3915 Urgency of urination: Secondary | ICD-10-CM | POA: Diagnosis not present

## 2020-11-01 DIAGNOSIS — E282 Polycystic ovarian syndrome: Secondary | ICD-10-CM | POA: Diagnosis not present

## 2020-11-01 DIAGNOSIS — R350 Frequency of micturition: Secondary | ICD-10-CM | POA: Diagnosis not present

## 2020-11-03 DIAGNOSIS — S60042A Contusion of left ring finger without damage to nail, initial encounter: Secondary | ICD-10-CM | POA: Diagnosis not present

## 2021-02-01 DIAGNOSIS — E611 Iron deficiency: Secondary | ICD-10-CM | POA: Diagnosis not present

## 2021-02-01 DIAGNOSIS — N3010 Interstitial cystitis (chronic) without hematuria: Secondary | ICD-10-CM | POA: Diagnosis not present

## 2021-02-01 DIAGNOSIS — E7800 Pure hypercholesterolemia, unspecified: Secondary | ICD-10-CM | POA: Diagnosis not present

## 2021-02-01 DIAGNOSIS — N181 Chronic kidney disease, stage 1: Secondary | ICD-10-CM | POA: Diagnosis not present

## 2021-02-01 DIAGNOSIS — Q613 Polycystic kidney, unspecified: Secondary | ICD-10-CM | POA: Diagnosis not present

## 2021-02-01 DIAGNOSIS — R7303 Prediabetes: Secondary | ICD-10-CM | POA: Diagnosis not present

## 2021-02-06 ENCOUNTER — Other Ambulatory Visit: Payer: Self-pay | Admitting: Nephrology

## 2021-02-06 DIAGNOSIS — Q613 Polycystic kidney, unspecified: Secondary | ICD-10-CM

## 2021-02-21 ENCOUNTER — Other Ambulatory Visit: Payer: Self-pay

## 2021-02-21 ENCOUNTER — Ambulatory Visit
Admission: RE | Admit: 2021-02-21 | Discharge: 2021-02-21 | Disposition: A | Discharge status: Home / Self Care | Payer: Medicare HMO | Source: Ambulatory Visit | Attending: Nephrology | Admitting: Nephrology

## 2021-02-21 DIAGNOSIS — K660 Peritoneal adhesions (postprocedural) (postinfection): Secondary | ICD-10-CM | POA: Diagnosis not present

## 2021-02-21 DIAGNOSIS — K828 Other specified diseases of gallbladder: Secondary | ICD-10-CM | POA: Diagnosis not present

## 2021-02-21 DIAGNOSIS — Q613 Polycystic kidney, unspecified: Secondary | ICD-10-CM

## 2021-03-30 DIAGNOSIS — M25542 Pain in joints of left hand: Secondary | ICD-10-CM | POA: Diagnosis not present

## 2021-04-05 ENCOUNTER — Ambulatory Visit: Payer: Medicare HMO | Admitting: Medical

## 2021-04-05 DIAGNOSIS — Z0000 Encounter for general adult medical examination without abnormal findings: Secondary | ICD-10-CM

## 2021-04-06 ENCOUNTER — Encounter: Payer: Self-pay | Admitting: Medical

## 2021-04-10 DIAGNOSIS — H5213 Myopia, bilateral: Secondary | ICD-10-CM | POA: Diagnosis not present

## 2021-05-09 DIAGNOSIS — N809 Endometriosis, unspecified: Secondary | ICD-10-CM | POA: Diagnosis not present

## 2021-05-09 DIAGNOSIS — Z01411 Encounter for gynecological examination (general) (routine) with abnormal findings: Secondary | ICD-10-CM | POA: Diagnosis not present

## 2021-05-09 DIAGNOSIS — E8941 Symptomatic postprocedural ovarian failure: Secondary | ICD-10-CM | POA: Diagnosis not present

## 2021-05-09 DIAGNOSIS — Z23 Encounter for immunization: Secondary | ICD-10-CM | POA: Diagnosis not present

## 2021-05-09 DIAGNOSIS — Z01419 Encounter for gynecological examination (general) (routine) without abnormal findings: Secondary | ICD-10-CM | POA: Diagnosis not present

## 2021-05-09 DIAGNOSIS — Z1231 Encounter for screening mammogram for malignant neoplasm of breast: Secondary | ICD-10-CM | POA: Diagnosis not present

## 2021-05-09 DIAGNOSIS — Z124 Encounter for screening for malignant neoplasm of cervix: Secondary | ICD-10-CM | POA: Diagnosis not present

## 2021-05-09 DIAGNOSIS — Z90711 Acquired absence of uterus with remaining cervical stump: Secondary | ICD-10-CM | POA: Diagnosis not present

## 2021-05-09 DIAGNOSIS — Z113 Encounter for screening for infections with a predominantly sexual mode of transmission: Secondary | ICD-10-CM | POA: Diagnosis not present

## 2021-05-09 LAB — HM MAMMOGRAPHY

## 2021-05-10 ENCOUNTER — Encounter: Payer: Self-pay | Admitting: Internal Medicine

## 2021-05-11 DIAGNOSIS — M25542 Pain in joints of left hand: Secondary | ICD-10-CM | POA: Diagnosis not present

## 2021-06-13 DIAGNOSIS — M25542 Pain in joints of left hand: Secondary | ICD-10-CM | POA: Diagnosis not present

## 2021-06-13 DIAGNOSIS — M79645 Pain in left finger(s): Secondary | ICD-10-CM | POA: Diagnosis not present

## 2021-07-12 ENCOUNTER — Telehealth: Payer: Self-pay | Admitting: Medical

## 2021-07-12 NOTE — Telephone Encounter (Signed)
Left message for patient to call back and schedule Medicare Annual Wellness Visit (AWV) either virtually or in office. I left my number for patient to call [phone].  Last AWV ;04/04/20 please schedule at anytime with health coach  This should be a 45 minute visit.

## 2021-08-14 ENCOUNTER — Other Ambulatory Visit: Payer: Self-pay | Admitting: Nephrology

## 2021-08-14 DIAGNOSIS — Q613 Polycystic kidney, unspecified: Secondary | ICD-10-CM | POA: Diagnosis not present

## 2021-08-14 DIAGNOSIS — R7303 Prediabetes: Secondary | ICD-10-CM | POA: Diagnosis not present

## 2021-08-14 DIAGNOSIS — N181 Chronic kidney disease, stage 1: Secondary | ICD-10-CM

## 2021-08-14 DIAGNOSIS — E611 Iron deficiency: Secondary | ICD-10-CM | POA: Diagnosis not present

## 2021-08-14 DIAGNOSIS — N3010 Interstitial cystitis (chronic) without hematuria: Secondary | ICD-10-CM | POA: Diagnosis not present

## 2021-08-14 DIAGNOSIS — E7800 Pure hypercholesterolemia, unspecified: Secondary | ICD-10-CM | POA: Diagnosis not present

## 2021-09-05 ENCOUNTER — Emergency Department (HOSPITAL_COMMUNITY)
Admission: EM | Admit: 2021-09-05 | Discharge: 2021-09-05 | Disposition: A | Discharge status: Home / Self Care | Payer: Medicare HMO | Attending: Emergency Medicine | Admitting: Emergency Medicine

## 2021-09-05 ENCOUNTER — Other Ambulatory Visit: Payer: Self-pay

## 2021-09-05 ENCOUNTER — Emergency Department (HOSPITAL_COMMUNITY): Payer: Medicare HMO

## 2021-09-05 DIAGNOSIS — Z79899 Other long term (current) drug therapy: Secondary | ICD-10-CM | POA: Diagnosis not present

## 2021-09-05 DIAGNOSIS — Z7982 Long term (current) use of aspirin: Secondary | ICD-10-CM | POA: Insufficient documentation

## 2021-09-05 DIAGNOSIS — R2242 Localized swelling, mass and lump, left lower limb: Secondary | ICD-10-CM | POA: Insufficient documentation

## 2021-09-05 DIAGNOSIS — M25562 Pain in left knee: Secondary | ICD-10-CM | POA: Diagnosis not present

## 2021-09-05 DIAGNOSIS — Z91040 Latex allergy status: Secondary | ICD-10-CM | POA: Diagnosis not present

## 2021-09-05 DIAGNOSIS — G8929 Other chronic pain: Secondary | ICD-10-CM | POA: Diagnosis not present

## 2021-09-05 DIAGNOSIS — M1712 Unilateral primary osteoarthritis, left knee: Secondary | ICD-10-CM | POA: Diagnosis not present

## 2021-09-05 MED ORDER — HYDROCODONE-ACETAMINOPHEN 5-325 MG PO TABS
1.0000 | ORAL_TABLET | Freq: Once | ORAL | Status: AC
  Administered 2021-09-05: 1 via ORAL
  Filled 2021-09-05: qty 1

## 2021-09-05 MED ORDER — HYDROCODONE-ACETAMINOPHEN 5-325 MG PO TABS: 1.0000 | ORAL_TABLET | Freq: Four times a day (QID) | ORAL | 0 refills | Status: AC | PRN

## 2021-09-05 MED ORDER — TRAMADOL HCL 50 MG PO TABS
50.0000 mg | ORAL_TABLET | Freq: Once | ORAL | Status: DC
Start: 2021-09-05 — End: 2021-09-05

## 2021-09-05 NOTE — Discharge Instructions (Signed)
You have been seen and discharged from the emergency department.  Your x-ray shows some osteoarthritic findings and inflammation.  Take pain medicine as needed.  Do not mix this medication with alcohol or other sedating medications. Do not drive or do heavy physical activity until you know how this medication affects you.  It may cause drowsiness.  Follow-up with your orthopedic doctor for further evaluation and further care.  Rest, elevate and ice the knee.  Take home medications as prescribed. If you have any worsening symptoms or further concerns for your health please return to an emergency department for further evaluation. ?

## 2021-09-05 NOTE — ED Triage Notes (Signed)
Pt with L knee pain and swelling x 1 week. Denies injury. 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 No redness noted on assessment.  ?

## 2021-09-05 NOTE — ED Notes (Signed)
Patient verbalizes understanding of d/c instructions. Opportunities for questions and answers were provided. Pt d/c from ED and wheeled to lobby.  

## 2021-09-05 NOTE — ED Provider Notes (Signed)
?[facility] ?Provider Note ? ? ?CSN: [id number] ?Arrival date & time: 09/05/21  1606 ? ?  ? ?History ? ?Chief Complaint  ?Patient presents with  ? Knee Pain  ? ? ?Sharon Bowman is a 46 y.o. female. ? ?HPI ? ?46 year old female presents emergency department with left knee and pain swelling.  Patient states that she has had chronic pain in this knee for the past couple months, becoming more persistent in the past week.  She has history of an osteochondroma that was removed from the right knee.  She believes that she has the same diagnosis in the left knee.  She was able to schedule appointment with orthopedics in 2 days however presents for pain control.  There was no new trauma to the knee.  The swelling and pain has improved with elevation and icing.  No calf swelling or ankle swelling or foot discoloration. ? ?Home Medications ?Prior to Admission medications   ?Medication Sig Start Date End Date Taking? Authorizing Provider  ?amitriptyline (ELAVIL) 75 MG tablet Take 75 mg by mouth at bedtime. 07/07/20  Yes [provider]  ?aspirin EC 81 MG tablet Take 81 mg by mouth daily. Swallow whole.   Yes [provider]  ?aspirin-acetaminophen-caffeine (EXCEDRIN MIGRAINE) [phone] MG per tablet Take 1 tablet by mouth every 6 (six) hours as needed for headache or migraine. \   Yes [provider]  ?calcium-vitamin D (OSCAL WITH D) 500-200 MG-UNIT per tablet Take 1 tablet by mouth daily.   Yes [provider]  ?cholecalciferol (VITAMIN D3) 25 MCG (1000 UNIT) tablet Take 1,000 Units by mouth daily.   Yes [provider]  ?Cyanocobalamin (B-12) 50 MCG TABS Take 50 mcg by mouth daily.    Yes [provider]  ?diclofenac sodium (VOLTAREN) 1 % GEL Apply 2 g topically 4 (four) times daily. ?Patient taking differently: Apply 2 g topically daily as needed (For pain). 03/27/17  Yes Tysinger, Camelia Eng, PA-C  ?HYDROcodone-acetaminophen (NORCO)  5-325 MG tablet Take 1 tablet by mouth every 6 (six) hours as needed for up to 3 days for moderate pain. 09/05/21 09/08/21 Yes Dyron Kawano, Alvin Critchley, DO  ?hydrOXYzine (VISTARIL) 25 MG capsule TAKE 1 TO 2 CAPSULES BY MOUTH 1 HOUR BEFORE SLEEP 07/07/20  Yes [provider]  ?Iron-Vitamin C (FE C PO) Take 1 tablet by mouth daily.    Yes [provider]  ?Multiple Vitamin (MULTI-DAY VITAMINS PO) Take 1 tablet by mouth daily.   Yes [provider]  ?vitamin C (ASCORBIC ACID) 500 MG tablet Take 500 mg by mouth daily.   Yes [provider]  ?estradiol (CLIMARA - DOSED IN MG/24 HR) 0.1 mg/24hr patch Place 0.1 mg onto the skin once a week.    [provider]  ?rosuvastatin (CRESTOR) 5 MG tablet Take 1 tablet (5 mg total) by mouth daily. 04/28/20 04/28/21  Tysinger, Camelia Eng, PA-C  ?   ? ?Allergies    ?Latex   ? ?Review of Systems   ?Review of Systems  ?Constitutional:  Negative for fever.  ?Respiratory:  Negative for shortness of breath.   ?Cardiovascular:  Negative for chest pain.  ?Gastrointestinal:  Negative for abdominal pain.  ?Musculoskeletal:   ?     + Left knee pain  ?Skin:  Negative for color change, rash and wound.  ?Neurological:  Negative for weakness and headaches.  ? ?Physical Exam ?Updated Vital Signs ?BP 103/62 (BP Location: Right Arm)   Pulse 79  Temp 98 ?F (36.7 ?C)   Resp 16   LMP 11/01/2014 (Approximate)   SpO2 96%  ?Physical Exam ?Vitals and nursing note reviewed.  ?Constitutional:   ?   Appearance: Normal appearance.  ?HENT:  ?   Head: Normocephalic.  ?   Mouth/Throat:  ?   Mouth: Mucous membranes are moist.  ?Cardiovascular:  ?   Rate and Rhythm: Normal rate.  ?Pulmonary:  ?   Effort: Pulmonary effort is normal. No respiratory distress.  ?Musculoskeletal:  ?   Comments: Left knee is slightly edematous, diffusely tender to palpation and range of motion, no calf swelling, equal palpable DP pulses, the foot is unremarkable  ?Skin: ?   General: Skin is warm.   ?Neurological:  ?   Mental Status: She is alert and oriented to person, place, and time. Mental status is at baseline.  ?Psychiatric:     ?   Mood and Affect: Mood normal.  ? ? ?ED Results / Procedures / Treatments   ?Labs ?(all labs ordered are listed, but only abnormal results are displayed) ?Labs Reviewed - No data to display ? ?EKG ?None ? ?Radiology ?DG Knee Complete 4 Views Left ? ?Result Date: 09/05/2021 ?CLINICAL DATA:  Knee pain EXAM: LEFT KNEE - COMPLETE 4+ VIEW COMPARISON:  None. FINDINGS: No evidence of fracture, dislocation, or joint effusion. Mild medial tibiofemoral and patellofemoral joint space narrowing. Soft tissues are unremarkable. IMPRESSION: 1. No evidence of acute fracture or dislocation. No appreciable joint effusion. 2. Mild medial tibiofemoral and patellofemoral joint space narrowing, likely representing early osteoarthritis. Electronically Signed   By: Keane Police D.O.   On: 09/05/2021 18:05   ? ?Procedures ?Procedures  ? ? ?Medications Ordered in ED ?Medications  ?HYDROcodone-acetaminophen (NORCO/VICODIN) 5-325 MG per tablet 1 tablet (1 tablet Oral Given 09/05/21 2226)  ? ? ?ED Course/ Medical Decision Making/ A&P ?  ?                        ?Medical Decision Making ?Amount and/or Complexity of Data Reviewed ?Radiology: ordered. ? ?Risk ?Prescription drug management. ? ? ? ?46 year old female presents emergency department with acute on chronic left knee pain.  Vitals are normal and stable.  Leg is neurovascularly intact.  No calf swelling or findings of possible DVT.  Pain is localized to the knee, no new trauma to the knee.  X-ray shows some mild osteoarthritis, she has follow-up appointment with orthopedics already scheduled in 2 days.  Plan for pain control, conservative management and outpatient follow-up.  Patient at this time appears safe and stable for discharge and close outpatient follow up. Discharge plan and strict return to ED precautions discussed, patient verbalizes  understanding and agreement. ? ? ? ? ? ? ? ?Final Clinical Impression(s) / ED Diagnoses ?Final diagnoses:  ?Chronic pain of left knee  ? ? ?Rx / DC Orders ?ED Discharge Orders   ? ?      Ordered  ?  HYDROcodone-acetaminophen (NORCO) 5-325 MG tablet  Every 6 hours PRN       ? 09/05/21 2215  ? ?  ?  ? ?  ? ? ?  ?Lorelle Gibbs, DO ?09/05/21 2303 ? ?

## 2021-09-07 ENCOUNTER — Encounter: Payer: Self-pay | Admitting: Medical

## 2021-09-07 DIAGNOSIS — M25462 Effusion, left knee: Secondary | ICD-10-CM | POA: Diagnosis not present

## 2021-09-07 DIAGNOSIS — M25562 Pain in left knee: Secondary | ICD-10-CM | POA: Diagnosis not present

## 2021-09-13 ENCOUNTER — Encounter: Payer: Self-pay | Admitting: Medical

## 2021-09-27 ENCOUNTER — Telehealth: Payer: Self-pay

## 2021-09-27 ENCOUNTER — Ambulatory Visit (INDEPENDENT_AMBULATORY_CARE_PROVIDER_SITE_OTHER): Payer: Medicare HMO | Admitting: Family

## 2021-09-27 ENCOUNTER — Other Ambulatory Visit: Payer: Self-pay

## 2021-09-27 ENCOUNTER — Encounter: Payer: Self-pay | Admitting: Family

## 2021-09-27 VITALS — BP 120/72 | HR 85 | Temp 98.0°F | Resp 16 | Ht 62.0 in | Wt 142.1 lb

## 2021-09-27 DIAGNOSIS — M797 Fibromyalgia: Secondary | ICD-10-CM

## 2021-09-27 DIAGNOSIS — Q613 Polycystic kidney, unspecified: Secondary | ICD-10-CM | POA: Diagnosis not present

## 2021-09-27 DIAGNOSIS — M2550 Pain in unspecified joint: Secondary | ICD-10-CM | POA: Diagnosis not present

## 2021-09-27 DIAGNOSIS — E782 Mixed hyperlipidemia: Secondary | ICD-10-CM | POA: Insufficient documentation

## 2021-09-27 DIAGNOSIS — N3010 Interstitial cystitis (chronic) without hematuria: Secondary | ICD-10-CM

## 2021-09-27 DIAGNOSIS — F419 Anxiety disorder, unspecified: Secondary | ICD-10-CM | POA: Diagnosis not present

## 2021-09-27 DIAGNOSIS — Z86718 Personal history of other venous thrombosis and embolism: Secondary | ICD-10-CM

## 2021-09-27 DIAGNOSIS — Z1211 Encounter for screening for malignant neoplasm of colon: Secondary | ICD-10-CM | POA: Insufficient documentation

## 2021-09-27 DIAGNOSIS — F5101 Primary insomnia: Secondary | ICD-10-CM

## 2021-09-27 DIAGNOSIS — D509 Iron deficiency anemia, unspecified: Secondary | ICD-10-CM | POA: Diagnosis not present

## 2021-09-27 DIAGNOSIS — E559 Vitamin D deficiency, unspecified: Secondary | ICD-10-CM

## 2021-09-27 DIAGNOSIS — F32A Depression, unspecified: Secondary | ICD-10-CM

## 2021-09-27 DIAGNOSIS — E8940 Asymptomatic postprocedural ovarian failure: Secondary | ICD-10-CM

## 2021-09-27 LAB — VITAMIN D 25 HYDROXY (VIT D DEFICIENCY, FRACTURES): VITD: 46.36 ng/mL (ref 30.00–100.00)

## 2021-09-27 LAB — CBC WITH DIFFERENTIAL/PLATELET
Basophils Absolute: 0.1 10*3/uL (ref 0.0–0.1)
Basophils Relative: 0.7 % (ref 0.0–3.0)
Eosinophils Absolute: 0.2 10*3/uL (ref 0.0–0.7)
Eosinophils Relative: 2 % (ref 0.0–5.0)
HCT: 36.1 % (ref 36.0–46.0)
Hemoglobin: 12.2 g/dL (ref 12.0–15.0)
Lymphocytes Relative: 31.4 % (ref 12.0–46.0)
Lymphs Abs: 2.6 10*3/uL (ref 0.7–4.0)
MCHC: 33.7 g/dL (ref 30.0–36.0)
MCV: 89.8 fl (ref 78.0–100.0)
Monocytes Absolute: 0.5 10*3/uL (ref 0.1–1.0)
Monocytes Relative: 5.7 % (ref 3.0–12.0)
Neutro Abs: 5 10*3/uL (ref 1.4–7.7)
Neutrophils Relative %: 60.2 % (ref 43.0–77.0)
Platelets: 323 10*3/uL (ref 150.0–400.0)
RBC: 4.02 Mil/uL (ref 3.87–5.11)
RDW: 16.2 % — ABNORMAL HIGH (ref 11.5–15.5)
WBC: 8.3 10*3/uL (ref 4.0–10.5)

## 2021-09-27 LAB — COMPREHENSIVE METABOLIC PANEL
ALT: 16 U/L (ref 0–35)
AST: 16 U/L (ref 0–37)
Albumin: 4.1 g/dL (ref 3.5–5.2)
Alkaline Phosphatase: 75 U/L (ref 39–117)
BUN: 9 mg/dL (ref 6–23)
CO2: 30 mEq/L (ref 19–32)
Calcium: 9.3 mg/dL (ref 8.4–10.5)
Chloride: 98 mEq/L (ref 96–112)
Creatinine, Ser: 0.8 mg/dL (ref 0.40–1.20)
GFR: 88.83 mL/min (ref >60.00)
Glucose, Bld: 69 mg/dL — ABNORMAL LOW (ref 70–99)
Potassium: 4.3 mEq/L (ref 3.5–5.1)
Sodium: 134 mEq/L — ABNORMAL LOW (ref 135–145)
Total Bilirubin: 0.2 mg/dL (ref 0.2–1.2)
Total Protein: 6.3 g/dL (ref 6.0–8.3)

## 2021-09-27 LAB — LIPID PANEL
Cholesterol: 245 mg/dL — ABNORMAL HIGH (ref 0–200)
HDL: 65.5 mg/dL (ref >39.00)
NonHDL: 179.5
Total CHOL/HDL Ratio: 4
Triglycerides: 202 mg/dL — ABNORMAL HIGH (ref 0.0–149.0)
VLDL: 40.4 mg/dL — ABNORMAL HIGH (ref 0.0–40.0)

## 2021-09-27 LAB — SEDIMENTATION RATE: Sed Rate: 21 mm/hr — ABNORMAL HIGH (ref 0–20)

## 2021-09-27 LAB — LDL CHOLESTEROL, DIRECT: Direct LDL: 158 mg/dL

## 2021-09-27 LAB — IBC + FERRITIN
Ferritin: 12.5 ng/mL (ref 10.0–291.0)
Iron: 42 ug/dL (ref 42–145)
Saturation Ratios: 10.3 % — ABNORMAL LOW (ref 20.0–50.0)
TIBC: 407.4 ug/dL (ref 250.0–450.0)
Transferrin: 291 mg/dL (ref 212.0–360.0)

## 2021-09-27 LAB — B12 AND FOLATE PANEL
Folate: >24.2 ng/mL (ref >5.9)
Vitamin B-12: 937 pg/mL — ABNORMAL HIGH (ref 211–911)

## 2021-09-27 LAB — C-REACTIVE PROTEIN: CRP: <1 mg/dL (ref 0.5–20.0)

## 2021-09-27 NOTE — Assessment & Plan Note (Signed)
Autoimmune work-up in process to include CRP sed rate ANA and rheumatoid factor. ?

## 2021-09-27 NOTE — Assessment & Plan Note (Signed)
Continue Elavil 75 mg once nightly ?Sleep hygiene discussed and given printout on this ?

## 2021-09-27 NOTE — Assessment & Plan Note (Signed)
Continue vitamin D supplementation of 1000 mcg once daily ?Ordered vitamin D level pending results ?

## 2021-09-27 NOTE — Progress Notes (Signed)
? ?New Patient Office Visit ? ?Subjective:  ?Patient ID: Sharon Bowman, female    DOB: 09-18-1975  Age: 46 y.o. MRN: [id number] ? ?CC:  ?Chief Complaint  ?Patient presents with  ? Establish Care  ? ? ?HPI ?Sharon Bowman is here to establish care as a new patient. ? ?Prior provider was: Estate agent, PA-C at [facility] ?Pt is with acute concerns.  ? ?Polyarthrlaigia, bil hand pain stiffness, bil knee pain stiffness. Has seen orthopedist for left knee pain as of recent, was given  ? ?chronic concerns: ? ?Insomnia: hard to fall asleep with IC, and has to get up often throughout the night so hydroxyxine helps her to fall asleep and stay asleep. Also takes amitripyline 75 mg at bedtime.  ? ?Interstitial cystitis: sees urologist, Dr. Alona Bene, [facility]. Not really taking any medications at current for this.  ? ?Complete hysterectomy: does apply estradiol patch, climara. Sees Gyn at [facility], Dr. Aloha Gell.  ? ?Migraines with aura: started early adulthood diagnosed with fibromyalgia June 2016 and migraines have worsened since then. Takes excedrin migraine prn, occurs about once weekly. Lasts for a few hours unless excedrin migraine hits it. Otherwise will last 24 hours. Will have nausea/vomiting, with audiosensitivity and or light sensitivity. Does feel tightness in lower neck prior with neck tension. About once weekly.  ? ?Fibromyalgia: did see Dr. Kathi Ludwig, taking duloxetine 120 mg once daily. Was increased to 120 by urologist. Sherald Hess and doing well on 120 mg once daily. She is overdue for f/u.  ? ?Pt states taking gabapentin but unsure of dose. She states she peeled off the label for the container but will call orthopedist who was prescribing to see what dosage was. 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  ? ?H/o DVT, post knee surgery, had two dvts. Placed on blood thinner, doesn't remember, then was told to take asa 81 mg once daily. No h/o DVT since.  ? ?PKD:  sees [place], at [facility]. Sees him every six months. Stable. doing well.  ? ?Past Medical History:  ?Diagnosis Date  ? Allergic asthma 08/2015  ? Anemia   ? Anxiety   ? Arthritis   ? osetoarthritis  ? Chronic sinusitis   ? Depression   ? DVT (deep venous thrombosis) ([place]) 04/2014  ? right leg - behind calf, no treated with aspirin daily  ? Endometriosis   ? Family history of premature CAD   ? Fibromyalgia 2017  ? Former smoker   ? GAD (generalized anxiety disorder)   ? Headache   ? History of PFTs 08/2015  ? normal  ? Interstitial cystitis   ? PCOS (polycystic ovarian syndrome)   ? Polycystic kidney disease   ? ? ?Past Surgical History:  ?Procedure Laterality Date  ? ABDOMINAL HYSTERECTOMY    ? BLADDER SURGERY    ? BLADDER STRETCH X 3  ? carpel radial tunnel  2014  ? carpel tunnel    ? left  and right hand   ? CYSTO WITH HYDRODISTENSION N/A 11/11/2014  ? Procedure: CYSTOSCOPY/HYDRODISTENSION MARCAINE AND PYRIDIUM AND Esmeralda Links;  Surgeon: Carolan Clines, MD;  Location: [facility];  Service: Urology;  Laterality: N/A;  ? CYSTOSCOPY N/A 11/11/2014  ? Procedure: CYSTOSCOPY;  Surgeon: Aloha Gell, MD;  Location: [facility];  Service: Gynecology;  Laterality: N/A;  ? CYSTOSCOPY WITH RETROGRADE PYELOGRAM, URETEROSCOPY AND STENT PLACEMENT Bilateral 11/11/2014  ? Procedure:  RETROGRADE PYELOGRAM with BILATERAL URETERAL CATHETHERS;  Surgeon: Carolan Clines, MD;  Location: [facility];  Service: Urology;  Laterality: Bilateral;  ? IUD REMOVAL N/A 11/11/2014  ? Procedure: INTRAUTERINE DEVICE (IUD) REMOVAL;  Surgeon: Aloha Gell, MD;  Location: [facility];  Service: Gynecology;  Laterality: N/A;  ? LAPAROSCOPY    ? X 2 ENDOMETRIOSIS.  ? LEG SURGERY  04/2014  ? right knee - tumor - [facility]ighn  ? ROBOTIC ASSISTED LAPAROSCOPIC LYSIS OF ADHESION N/A 11/11/2014  ? Procedure: ROBOTIC ASSISTED LAPAROSCOPIC LYSIS OF ADHESION;  Surgeon: Aloha Gell, MD;  Location: [facility];  Service: Gynecology;  Laterality: N/A;  ? ROBOTIC ASSISTED  TOTAL HYSTERECTOMY WITH BILATERAL SALPINGO OOPHERECTOMY Bilateral 11/11/2014  ? Procedure: ROBOTIC ASSISTED TOTAL HYSTERECTOMY WITH BILATERAL SALPINGO OOPHORECTOMY;  Surgeon: Aloha Gell, MD;  Location: [facility];  Service: Gynecology;  Laterality: Bilateral;  ? TOOTH EXTRACTION    ? ? ?Family History  ?Problem Relation Age of Onset  ? Diabetes Mother   ? Macular degeneration Mother   ? Hyperlipidemia Mother   ? Hypertension Mother   ? Polycystic kidney disease Father   ? Heart disease Father 35  ?     MI, CABG  ? Diabetes Father   ? Hepatitis Father   ?     C  ? Hypothyroidism Father   ? Hypertension Father   ? Gout Father   ? Cancer Maternal Grandfather   ?     bone  ? Heart disease Paternal Grandmother   ? Heart disease Paternal Grandfather   ? ? ?Social History  ? ?Socioeconomic History  ? Marital status: Significant Other  ?  Spouse name: Not on file  ? Number of children: 0  ? Years of education: Not on file  ? Highest education level: Not on file  ?Occupational History  ? Occupation: bartender  ?Tobacco Use  ? Smoking status: Former  ?  Packs/day: 1.00  ?  Years: 18.00  ?  Pack years: 18.00  ?  Types: Cigarettes  ?  Quit date: 07/02/2014  ?  Years since quitting: 7.2  ? Smokeless tobacco: Never  ?Vaping Use  ? Vaping Use: Never used  ?Substance and Sexual Activity  ? Alcohol use: Not Currently  ? Drug use: Yes  ?  Types: Marijuana  ?  Comment: marijuana use  - last use 2 yrs ago  ? Sexual activity: Never  ?  Partners: Male  ?  Birth control/protection: Surgical  ?Other Topics Concern  ? Not on file  ?Social History Narrative  ? Not on file  ? ?Social Determinants of Health  ? ?Financial Resource Strain: Not on file  ?Food Insecurity: Not on file  ?Transportation Needs: Not on file  ?Physical Activity: Not on file  ?Stress: Not on file  ?Social Connections: Not on file  ?Intimate Partner Violence: Not on file  ? ? ?Outpatient Medications Prior to Visit  ?Medication Sig Dispense Refill  ? amitriptyline (ELAVIL)  75 MG tablet Take 75 mg by mouth at bedtime.    ? aspirin EC 81 MG tablet Take 81 mg by mouth daily. Swallow whole.    ? aspirin-acetaminophen-caffeine (EXCEDRIN MIGRAINE) 250-250-65 MG per tablet Take 1 tablet by mouth every 6 (six) hours as needed for headache or migraine. \    ? Calcium Carbonate-Vit D-Min (CALCIUM 1200 PO) Take by mouth.    ? cholecalciferol (VITAMIN D3) 25 MCG (1000 UNIT) tablet Take 1,000 Units by mouth daily.    ? Cyanocobalamin (B-12) 50 MCG TABS Take 50 mcg by mouth daily.     ?  diclofenac sodium (VOLTAREN) 1 % GEL Apply 2 g topically 4 (four) times daily. (Patient taking differently: Apply 2 g topically daily as needed (For pain).) 100 g 1  ? DULoxetine (CYMBALTA) 60 MG capsule Take 120 mg by mouth daily.    ? estradiol (VIVELLE-DOT) 0.1 MG/24HR patch 1 patch 2 (two) times a week.    ? ferrous sulfate 325 (65 FE) MG tablet Take 325 mg by mouth daily with breakfast.    ? GABAPENTIN PO Take by mouth 3 (three) times daily.    ? hydrOXYzine (VISTARIL) 25 MG capsule TAKE 1 TO 2 CAPSULES BY MOUTH 1 HOUR BEFORE SLEEP    ? Multiple Vitamin (MULTI-DAY VITAMINS PO) Take 1 tablet by mouth daily.    ? vitamin C (ASCORBIC ACID) 500 MG tablet Take 500 mg by mouth daily.    ? calcium-vitamin D (OSCAL WITH D) 500-200 MG-UNIT per tablet Take 1 tablet by mouth daily.    ? estradiol (CLIMARA - DOSED IN MG/24 HR) 0.1 mg/24hr patch Place 0.1 mg onto the skin once a week.    ? Iron-Vitamin C (FE C PO) Take 1 tablet by mouth daily.     ? rosuvastatin (CRESTOR) 5 MG tablet Take 1 tablet (5 mg total) by mouth daily. 90 tablet 3  ? ?No facility-administered medications prior to visit.  ? ? ?Allergies  ?Allergen Reactions  ? Latex Itching and Rash  ? ? ?ROS ?Review of Systems  ?Constitutional:  Negative for chills and fatigue.  ?Respiratory:  Negative for cough and shortness of breath.   ?Cardiovascular:  Negative for chest pain and leg swelling.  ?Gastrointestinal:  Negative for diarrhea and nausea.   ?Genitourinary:  Positive for frequency (nocturia, due to IC, chronic). Negative for difficulty urinating.  ?Musculoskeletal:  Positive for arthralgias (multiple joint arthralgias with joint stiffness) and joint swell

## 2021-09-27 NOTE — Assessment & Plan Note (Signed)
Was on Crestor in the past patient states no longer taking, Ordered lipid panel, pending results. Work on low cholesterol diet and exercise as tolerated ? ?

## 2021-09-27 NOTE — Patient Instructions (Addendum)
Please clarify the dose of your gabapentin.  ? ?Please work on the following to help with sleep: ? ?-Sleep only long enough to feel rested then get out of bed ?-Go to bed and get up at the same time every day. ?-Do not try to force yourself to sleep. If you can't sleep, get out of bed adn try again later. ?-Have coffee, tea, and other foods that have caffeine only in the morning. ?-Avoid alcohol ?-Keep your bedroom dark, cool, quiet, and free of reminders of work or other things that cause you stress ?-Exercise several days a week, but not right before bed ?-Avoid looking at phones or reading devices ("e-books") that give off light before bed. This can make it harder to fall asleep ? ? ?Stop by the lab prior to leaving today. I will notify you of your results once received.  ? ? ?Regards,  ? ?Devinn Hurwitz ?FNP-C ? ?

## 2021-09-27 NOTE — Assessment & Plan Note (Signed)
Patient still taking estradiol advised patient to follow-up with GYN for refill of this as I do not prescribe for hormones ? ?Did discuss risk for hormone use ?

## 2021-09-27 NOTE — Assessment & Plan Note (Signed)
Continue follow-up with urologist as scheduled ?

## 2021-09-27 NOTE — Assessment & Plan Note (Signed)
Referral placed for GI for colonoscopy ?

## 2021-09-27 NOTE — Assessment & Plan Note (Signed)
Continue daily baby aspirin 81 mg ?

## 2021-09-27 NOTE — Assessment & Plan Note (Signed)
Patient to continue with Cymbalta 120 mg once daily as well as Elavil 75 mg once nightly for sleep ?Patient given handout for anxiety reducing techniques ?

## 2021-09-27 NOTE — Telephone Encounter (Signed)
Left message to return call to our office.  

## 2021-09-27 NOTE — Assessment & Plan Note (Signed)
Continue follow-up with kidney specialist as scheduled ?

## 2021-09-27 NOTE — Assessment & Plan Note (Signed)
Ordered iron panel and ferritin pending results as well as CBC ?

## 2021-09-27 NOTE — Telephone Encounter (Signed)
CALLED PATIENT NO ANSWER LEFT VOICEMAIL FOR A CALL BACK ? ?

## 2021-09-27 NOTE — Assessment & Plan Note (Signed)
Patient overdue for follow-up with rheumatologist referral placed again as patient new patient medical rheumatology office.  Patient with ongoing polyarthralgia as well as neuralgias.  Patient on Cymbalta 120 which is helping slightly however not completely.  Often with swelling we will do a work-up as well for polyarthralgia ? ?

## 2021-09-28 ENCOUNTER — Telehealth: Payer: Self-pay

## 2021-09-28 NOTE — Progress Notes (Signed)
Left message to return call to our office.  

## 2021-09-28 NOTE — Progress Notes (Signed)
B12 is a bit high. Clarify what dose you are currently taking, it says 50 mcg but likely higher I am guessing.  ? ?Iron ok, on the lower end. Recommend daily iron supplement, slow FE otc is a good brand. ? ?Sed rate barely elevated, acceptable. Will monitor. ? ?Glucose/sugar on lower end, just ensure you eat every 2-3 hours throughout the day to avoid blood sugar drop.

## 2021-09-28 NOTE — Telephone Encounter (Signed)
CALLED PATIENT NO ANSWER LEFT VOICEMAIL FOR A CALL BACK ? ?

## 2021-09-28 NOTE — Telephone Encounter (Signed)
Left message to return call to our office.  

## 2021-09-29 LAB — ANA: Anti Nuclear Antibody (ANA): NEGATIVE

## 2021-09-29 LAB — RHEUMATOID FACTOR: Rheumatoid fact SerPl-aCnc: <14 IU/mL (ref <14)

## 2021-10-02 NOTE — Progress Notes (Signed)
Also,  ?Ana and RF negative.

## 2021-10-03 ENCOUNTER — Telehealth: Payer: Self-pay

## 2021-10-03 NOTE — Telephone Encounter (Signed)
Called patient reviewed all information and repeated back to me. Will call if any questions.  ? ?

## 2021-10-24 ENCOUNTER — Telehealth: Payer: Self-pay | Admitting: Family

## 2021-10-24 NOTE — Telephone Encounter (Signed)
Pt called to tell Ruqayya about her dosage for GABAPENTIN PO (300 mg). 3 times a day. ?

## 2021-10-24 NOTE — Telephone Encounter (Signed)
Pt needs a refill. Does she need an appt? Also informed pt about doses and how many to take a day. ?

## 2021-10-25 NOTE — Telephone Encounter (Signed)
Left message to return call to our office.  

## 2021-11-02 ENCOUNTER — Telehealth: Payer: Self-pay | Admitting: Family

## 2021-11-02 NOTE — Telephone Encounter (Signed)
Pt is returning [facility] call. Does not remember the reason of the call. Please advise  ? ?Callback Number: [phone] ?

## 2021-11-03 ENCOUNTER — Other Ambulatory Visit: Payer: Self-pay | Admitting: Family

## 2021-11-03 DIAGNOSIS — G5623 Lesion of ulnar nerve, bilateral upper limbs: Secondary | ICD-10-CM

## 2021-11-03 MED ORDER — GABAPENTIN 300 MG PO CAPS: 300.0000 mg | ORAL_CAPSULE | Freq: Three times a day (TID) | ORAL | 1 refills | Status: DC

## 2021-11-03 NOTE — Telephone Encounter (Signed)
Noted  

## 2021-11-15 DIAGNOSIS — K635 Polyp of colon: Secondary | ICD-10-CM | POA: Diagnosis not present

## 2021-11-15 DIAGNOSIS — Z1211 Encounter for screening for malignant neoplasm of colon: Secondary | ICD-10-CM | POA: Diagnosis not present

## 2021-11-15 LAB — HM COLONOSCOPY

## 2021-11-17 DIAGNOSIS — K635 Polyp of colon: Secondary | ICD-10-CM | POA: Diagnosis not present

## 2021-12-11 NOTE — Progress Notes (Signed)
noted 

## 2021-12-19 ENCOUNTER — Ambulatory Visit (INDEPENDENT_AMBULATORY_CARE_PROVIDER_SITE_OTHER): Payer: Medicare HMO | Admitting: Family

## 2021-12-19 ENCOUNTER — Encounter: Payer: Self-pay | Admitting: Family

## 2021-12-19 VITALS — BP 110/68 | HR 93 | Temp 98.6°F | Resp 16 | Ht 62.0 in | Wt 149.0 lb

## 2021-12-19 DIAGNOSIS — G43109 Migraine with aura, not intractable, without status migrainosus: Secondary | ICD-10-CM

## 2021-12-19 DIAGNOSIS — H6123 Impacted cerumen, bilateral: Secondary | ICD-10-CM | POA: Insufficient documentation

## 2021-12-19 MED ORDER — AMITRIPTYLINE HCL 100 MG PO TABS: 100.0000 mg | ORAL_TABLET | Freq: Every day | ORAL | 1 refills | Status: DC

## 2021-12-19 NOTE — Patient Instructions (Addendum)
A referral was placed today for neurology.  Please let us know if you have not heard back within 2 weeks about the referral.  Get some over the counter debrox for your ears, use for about one week.  Follow up one week for ear irrigation.   Increase elavil to 100 mg daily from 75.  Continue cymbalta.   Reach out to gyn, estradiol patch may worsen the headaches as well.   Due to recent changes in healthcare laws, you may see results of your imaging and/or laboratory studies on MyChart before I have had a chance to review them.  I understand that in some cases there may be results that are confusing or concerning to you. Please understand that not all results are received at the same time and often I may need to interpret multiple results in order to provide you with the best plan of care or course of treatment. Therefore, I ask that you please give me 2 business days to thoroughly review all your results before contacting my office for clarification. Should we see a critical lab result, you will be contacted sooner.   It was a pleasure seeing you today! Please do not hesitate to reach out with any questions and or concerns.  Regards,   Eugenia Pancoast FNP-C

## 2021-12-19 NOTE — Assessment & Plan Note (Signed)
Debrox x one week F/u one week for irrigation

## 2021-12-19 NOTE — Progress Notes (Signed)
Established Patient Office Visit  Subjective:  Patient ID: Sharon Bowman, female    DOB: 10/17/1975  Age: 46 y.o. MRN: [id number]  CC:  Chief Complaint  Patient presents with   Ear Pain    Right ear pain X for a while comes and goes    Migraine    Getting worse    HPI Sharon Bowman is here today for follow up.   Pt is with acute concerns  Migraines are now staying about weekly, but seem to be increasing in intensity.  Find they typically last for a a few hours if she takes excedrin migraine in time, and or else she will have for about 24 hours. Will have nausea/vomiting s well with audio and light sensitivity. Does still have some neck tightness but off and on, not always associated with the migraines.   Right ear pain comes and goes, going on for about the last 6-8 months.  No sinus pressure. No nasal congestion.  No sneezing. Pnd at times.   Last visit was slightly elevated sed rate at 21.   B12 slightly elevated last visit however pt d/c b12 supplements about one month ago now.     Past Medical History:  Diagnosis Date   Allergic asthma 08/2015   Anemia    Anxiety    Arthritis    osetoarthritis   Chronic sinusitis    Depression    DVT (deep venous thrombosis) ([place]) 04/2014   right leg - behind calf, no treated with aspirin daily   Endometriosis    Family history of premature CAD    Fibromyalgia 2017   Former smoker    GAD (generalized anxiety disorder)    Headache    History of PFTs 08/2015   normal   Interstitial cystitis    PCOS (polycystic ovarian syndrome)    Polycystic kidney disease     Past Surgical History:  Procedure Laterality Date   ABDOMINAL HYSTERECTOMY     BLADDER SURGERY     BLADDER STRETCH X 3   carpel radial tunnel  2014   carpel tunnel     left  and right hand    CYSTO WITH HYDRODISTENSION N/A 11/11/2014   Procedure: CYSTOSCOPY/HYDRODISTENSION MARCAINE AND PYRIDIUM AND Esmeralda Links;  Surgeon: Carolan Clines, MD;  Location: [facility];  Service: Urology;  Laterality: N/A;   CYSTOSCOPY N/A 11/11/2014   Procedure: CYSTOSCOPY;  Surgeon: Aloha Gell, MD;  Location: [facility];  Service: Gynecology;  Laterality: N/A;   CYSTOSCOPY WITH RETROGRADE PYELOGRAM, URETEROSCOPY AND STENT PLACEMENT Bilateral 11/11/2014   Procedure:  RETROGRADE PYELOGRAM with BILATERAL URETERAL CATHETHERS;  Surgeon: Carolan Clines, MD;  Location: [facility];  Service: Urology;  Laterality: Bilateral;   IUD REMOVAL N/A 11/11/2014   Procedure: INTRAUTERINE DEVICE (IUD) REMOVAL;  Surgeon: Aloha Gell, MD;  Location: [facility];  Service: Gynecology;  Laterality: N/A;   LAPAROSCOPY     X 2 ENDOMETRIOSIS.   LEG SURGERY  04/2014   right knee - tumor - benighn   ROBOTIC ASSISTED LAPAROSCOPIC LYSIS OF ADHESION N/A 11/11/2014   Procedure: ROBOTIC ASSISTED LAPAROSCOPIC LYSIS OF ADHESION;  Surgeon: Aloha Gell, MD;  Location: [facility];  Service: Gynecology;  Laterality: N/A;   ROBOTIC ASSISTED TOTAL HYSTERECTOMY WITH BILATERAL SALPINGO OOPHERECTOMY Bilateral 11/11/2014   Procedure: ROBOTIC ASSISTED TOTAL HYSTERECTOMY WITH BILATERAL SALPINGO OOPHORECTOMY;  Surgeon: Aloha Gell, MD;  Location: [facility];  Service: Gynecology;  Laterality: Bilateral;   TOOTH EXTRACTION      Family History  Problem Relation Age of Onset   Diabetes Mother    Macular degeneration Mother    Hyperlipidemia Mother    Hypertension Mother    Polycystic kidney disease Father    Heart disease Father 26       MI, CABG   Diabetes Father    Hepatitis Father        C   Hypothyroidism Father    Hypertension Father    Gout Father    Cancer Maternal Grandfather        bone   Heart disease Paternal Grandmother    Heart disease Paternal Grandfather     Social History   Socioeconomic History   Marital status: Significant Other    Spouse name: Not on file   Number of children: 0   Years of education: Not on file   Highest education level: Not on file  Occupational History    Occupation: bartender  Tobacco Use   Smoking status: Former    Packs/day: 1.00    Years: 18.00    Total pack years: 18.00    Types: Cigarettes    Quit date: 07/02/2014    Years since quitting: 7.4   Smokeless tobacco: Never  Vaping Use   Vaping Use: Never used  Substance and Sexual Activity   Alcohol use: Not Currently   Drug use: Yes    Types: Marijuana    Comment: marijuana use  - last use 2 yrs ago   Sexual activity: Never    Partners: Male    Birth control/protection: Surgical  Other Topics Concern   Not on file  Social History Narrative   Not on file   Social Determinants of Health   Financial Resource Strain: Not on file  Food Insecurity: Not on file  Transportation Needs: Not on file  Physical Activity: Not on file  Stress: Not on file  Social Connections: Not on file  Intimate Partner Violence: Not on file    Outpatient Medications Prior to Visit  Medication Sig Dispense Refill   aspirin EC 81 MG tablet Take 81 mg by mouth daily. Swallow whole.     aspirin-acetaminophen-caffeine (EXCEDRIN MIGRAINE) 250-250-65 MG per tablet Take 1 tablet by mouth every 6 (six) hours as needed for headache or migraine. \     Calcium Carbonate-Vit D-Min (CALCIUM 1200 PO) Take by mouth.     cholecalciferol (VITAMIN D3) 25 MCG (1000 UNIT) tablet Take 1,000 Units by mouth daily.     Cyanocobalamin (B-12) 50 MCG TABS Take 50 mcg by mouth daily.      DULoxetine (CYMBALTA) 60 MG capsule Take 120 mg by mouth daily.     estradiol (VIVELLE-DOT) 0.1 MG/24HR patch 1 patch 2 (two) times a week.     ferrous sulfate 325 (65 FE) MG tablet Take 325 mg by mouth daily with breakfast.     gabapentin (NEURONTIN) 300 MG capsule Take 1 capsule (300 mg total) by mouth 3 (three) times daily. 270 capsule 1   hydrOXYzine (VISTARIL) 25 MG capsule TAKE 1 TO 2 CAPSULES BY MOUTH 1 HOUR BEFORE SLEEP     Multiple Vitamin (MULTI-DAY VITAMINS PO) Take 1 tablet by mouth daily.     vitamin C (ASCORBIC ACID) 500 MG  tablet Take 500 mg by mouth daily.     amitriptyline (ELAVIL) 75 MG tablet Take 75 mg by mouth at bedtime.     diclofenac sodium (VOLTAREN) 1 % GEL Apply 2 g topically 4 (four) times daily. (Patient taking differently: Apply 2  g topically daily as needed (For pain).) 100 g 1   No facility-administered medications prior to visit.    Allergies  Allergen Reactions   Latex Itching and Rash          Objective:    Physical Exam Constitutional:      General: She is not in acute distress.    Appearance: Normal appearance. She is not ill-appearing.  HENT:     Right Ear: Tympanic membrane normal. There is impacted cerumen.     Left Ear: Tympanic membrane normal. There is impacted cerumen.     Nose: Nose normal. No congestion or rhinorrhea.     Right Turbinates: Not enlarged or swollen.     Left Turbinates: Not enlarged or swollen.     Right Sinus: No maxillary sinus tenderness or frontal sinus tenderness.     Left Sinus: No maxillary sinus tenderness or frontal sinus tenderness.     Mouth/Throat:     Mouth: Mucous membranes are moist.     Pharynx: No pharyngeal swelling, oropharyngeal exudate or posterior oropharyngeal erythema.     Tonsils: No tonsillar exudate.  Eyes:     Extraocular Movements: Extraocular movements intact.     Conjunctiva/sclera: Conjunctivae normal.     Pupils: Pupils are equal, round, and reactive to light.  Neck:     Thyroid: No thyroid mass.  Cardiovascular:     Rate and Rhythm: Normal rate and regular rhythm.  Pulmonary:     Effort: Pulmonary effort is normal.     Breath sounds: Normal breath sounds.  Lymphadenopathy:     Cervical:     Right cervical: No superficial cervical adenopathy.    Left cervical: No superficial cervical adenopathy.  Neurological:     Mental Status: She is alert.     Cranial Nerves: Cranial nerves 2-12 are intact. No cranial nerve deficit or facial asymmetry.     Coordination: Coordination is intact.     Gait: Gait is  intact.       BP 110/68   Pulse 93   Temp 98.6 F (37 C)   Resp 16   Ht '5\' 2"'$  (1.575 m)   Wt 149 lb (67.6 kg)   LMP 11/01/2014 (Approximate)   SpO2 98%   BMI 27.25 kg/m  Wt Readings from Last 3 Encounters:  12/19/21 149 lb (67.6 kg)  09/27/21 142 lb 2 oz (64.5 kg)  07/28/20 132 lb 6.4 oz (60.1 kg)     Health Maintenance Due  Topic Date Due   COVID-19 Vaccine (3 - Pfizer series) 12/07/2019    There are no preventive care reminders to display for this patient.  Lab Results  Component Value Date   TSH 1.07 09/29/2015   Lab Results  Component Value Date   WBC 8.3 09/27/2021   HGB 12.2 09/27/2021   HCT 36.1 09/27/2021   MCV 89.8 09/27/2021   PLT 323.0 09/27/2021   Lab Results  Component Value Date   NA 134 (L) 09/27/2021   K 4.3 09/27/2021   CO2 30 09/27/2021   GLUCOSE 69 (L) 09/27/2021   BUN 9 09/27/2021   CREATININE 0.80 09/27/2021   BILITOT 0.2 09/27/2021   ALKPHOS 75 09/27/2021   AST 16 09/27/2021   ALT 16 09/27/2021   PROT 6.3 09/27/2021   ALBUMIN 4.1 09/27/2021   CALCIUM 9.3 09/27/2021   ANIONGAP 4 (L) 11/12/2014   GFR 88.83 09/27/2021   Lab Results  Component Value Date   CHOL 245 (H) 09/27/2021  Lab Results  Component Value Date   HDL 65.50 09/27/2021   Lab Results  Component Value Date   LDLCALC 104 (H) 07/28/2020   Lab Results  Component Value Date   TRIG 202.0 (H) 09/27/2021   Lab Results  Component Value Date   CHOLHDL 4 09/27/2021   No results found for: "HGBA1C"    Assessment & Plan:   Problem List Items Addressed This Visit       Cardiovascular and Mediastinum   Migraine with aura and without status migrainosus, not intractable - Primary    Increase elavil to 100 mg qhs  Avoid triggers Increase water intake  Referral to neurology  Also let gyn know that you are on estradiol (pt advised)      Relevant Medications   amitriptyline (ELAVIL) 100 MG tablet   Other Relevant Orders   Ambulatory referral to  Neurology     Nervous and Auditory   Bilateral impacted cerumen    Debrox x one week F/u one week for irrigation       Meds ordered this encounter  Medications   amitriptyline (ELAVIL) 100 MG tablet    Sig: Take 1 tablet (100 mg total) by mouth at bedtime.    Dispense:  30 tablet    Refill:  1    Order Specific Question:   Supervising Provider    Answer:   Diona Browner, AMY E [[id number]]    Follow-up: Return in about 1 week (around 12/26/2021) for ear irrigatio .    Eugenia Pancoast, FNP

## 2021-12-19 NOTE — Assessment & Plan Note (Signed)
Increase elavil to 100 mg qhs  Avoid triggers Increase water intake  Referral to neurology  Also let gyn know that you are on estradiol (pt advised)

## 2021-12-26 ENCOUNTER — Ambulatory Visit (INDEPENDENT_AMBULATORY_CARE_PROVIDER_SITE_OTHER): Payer: Medicare HMO | Admitting: Family

## 2021-12-26 ENCOUNTER — Encounter: Payer: Self-pay | Admitting: Family

## 2021-12-26 ENCOUNTER — Encounter: Payer: Self-pay | Admitting: Neurology

## 2021-12-26 VITALS — BP 108/74 | HR 94 | Temp 98.7°F | Resp 16 | Ht 62.0 in | Wt 148.5 lb

## 2021-12-26 DIAGNOSIS — H6121 Impacted cerumen, right ear: Secondary | ICD-10-CM

## 2021-12-26 DIAGNOSIS — H6123 Impacted cerumen, bilateral: Secondary | ICD-10-CM

## 2021-12-26 NOTE — Assessment & Plan Note (Signed)
Ceruminosis is noted.  Obtained verbal patient consent prior to procedure, possible risks of procedure discussed with pt prior, and then Wax was removed by syringing/irrigation and manual debridement was performed by me with curette. Instructions for home care to prevent wax buildup are given and handout provided to pt .pt tolerated procedure well.   

## 2022-02-14 ENCOUNTER — Other Ambulatory Visit: Payer: Self-pay | Admitting: Family

## 2022-02-14 DIAGNOSIS — G43109 Migraine with aura, not intractable, without status migrainosus: Secondary | ICD-10-CM

## 2022-02-16 ENCOUNTER — Telehealth: Payer: Self-pay | Admitting: Family

## 2022-02-16 NOTE — Telephone Encounter (Signed)
Patient amitriptyline (ELAVIL) 100 MG tablet was increased and she received a text from pharmacy to contact us regarding this. Please advise. Thank you!

## 2022-03-30 ENCOUNTER — Encounter: Payer: Self-pay | Admitting: Family

## 2022-03-30 ENCOUNTER — Ambulatory Visit (INDEPENDENT_AMBULATORY_CARE_PROVIDER_SITE_OTHER): Payer: Medicare HMO | Admitting: Family

## 2022-03-30 VITALS — BP 108/76 | HR 93 | Temp 98.6°F | Resp 16 | Ht 62.0 in | Wt 153.1 lb

## 2022-03-30 DIAGNOSIS — Z23 Encounter for immunization: Secondary | ICD-10-CM | POA: Diagnosis not present

## 2022-03-30 DIAGNOSIS — G43109 Migraine with aura, not intractable, without status migrainosus: Secondary | ICD-10-CM

## 2022-03-30 DIAGNOSIS — G5623 Lesion of ulnar nerve, bilateral upper limbs: Secondary | ICD-10-CM

## 2022-03-30 DIAGNOSIS — D509 Iron deficiency anemia, unspecified: Secondary | ICD-10-CM | POA: Diagnosis not present

## 2022-03-30 DIAGNOSIS — F411 Generalized anxiety disorder: Secondary | ICD-10-CM

## 2022-03-30 DIAGNOSIS — E782 Mixed hyperlipidemia: Secondary | ICD-10-CM | POA: Diagnosis not present

## 2022-03-30 MED ORDER — GABAPENTIN 300 MG PO CAPS: 300.0000 mg | ORAL_CAPSULE | Freq: Three times a day (TID) | ORAL | 1 refills | Status: DC

## 2022-03-30 NOTE — Progress Notes (Signed)
Established Patient Office Visit  Subjective:  Patient ID: Sharon Bowman, female    DOB: 02/11/1976  Age: 46 y.o. MRN: [id number]  CC:  Chief Complaint  Patient presents with  . Medication Refill    HPI KINNLEY PAULSON is here today for follow up.   Pt is with acute concerns.  Migraines, intensity has decreased with increase in elavil to 100 mg however pt states that still occurring at least once a week. Still with n/v/light and audio sensitivity when they occur. Still with ongoing neck tightness but there has been some improvement. Stays stressed so often very tight in the neck on a regular occasion. Working on neck exercises. Has appt with neurology but not until 05/2022.   Still taking estradiol patch, twice a week. Did have h/o DVT 2015 post op knee surgery. She takes daily baby aspirin.   IDA: iron was on lower end, did not start taking daily iron. She forgot to start. She denies sob or chest pain/ or palpitations. Does feel fatigued, but states this is typical for her with fibromyalgia.   Polycystic kidney disease, sees nephrologist every six months, stable per pt.    Past Medical History:  Diagnosis Date  . Allergic asthma 08/2015  . Anemia   . Anxiety   . Arthritis    osetoarthritis  . Chronic sinusitis   . Depression   . DVT (deep venous thrombosis) ([place]) 04/2014   right leg - behind calf, no treated with aspirin daily  . Endometriosis   . Family history of premature CAD   . Fibromyalgia 2017  . Former smoker   . GAD (generalized anxiety disorder)   . Headache   . History of PFTs 08/2015   normal  . Interstitial cystitis   . PCOS (polycystic ovarian syndrome)   . Polycystic kidney disease     Past Surgical History:  Procedure Laterality Date  . ABDOMINAL HYSTERECTOMY    . BLADDER SURGERY     BLADDER STRETCH X 3  . carpel radial tunnel  2014  . carpel tunnel     left  and right hand   . CYSTO WITH HYDRODISTENSION N/A 11/11/2014   Procedure:  CYSTOSCOPY/HYDRODISTENSION MARCAINE AND PYRIDIUM AND Esmeralda Links;  Surgeon: Carolan Clines, MD;  Location: [facility];  Service: Urology;  Laterality: N/A;  . CYSTOSCOPY N/A 11/11/2014   Procedure: CYSTOSCOPY;  Surgeon: Aloha Gell, MD;  Location: [facility];  Service: Gynecology;  Laterality: N/A;  . CYSTOSCOPY WITH RETROGRADE PYELOGRAM, URETEROSCOPY AND STENT PLACEMENT Bilateral 11/11/2014   Procedure:  RETROGRADE PYELOGRAM with BILATERAL URETERAL CATHETHERS;  Surgeon: Carolan Clines, MD;  Location: [facility];  Service: Urology;  Laterality: Bilateral;  . IUD REMOVAL N/A 11/11/2014   Procedure: INTRAUTERINE DEVICE (IUD) REMOVAL;  Surgeon: Aloha Gell, MD;  Location: [facility];  Service: Gynecology;  Laterality: N/A;  . LAPAROSCOPY     X 2 ENDOMETRIOSIS.  Marland Kitchen LEG SURGERY  04/2014   right knee - tumor - benighn  . ROBOTIC ASSISTED LAPAROSCOPIC LYSIS OF ADHESION N/A 11/11/2014   Procedure: ROBOTIC ASSISTED LAPAROSCOPIC LYSIS OF ADHESION;  Surgeon: Aloha Gell, MD;  Location: [facility];  Service: Gynecology;  Laterality: N/A;  . ROBOTIC ASSISTED TOTAL HYSTERECTOMY WITH BILATERAL SALPINGO OOPHERECTOMY Bilateral 11/11/2014   Procedure: ROBOTIC ASSISTED TOTAL HYSTERECTOMY WITH BILATERAL SALPINGO OOPHORECTOMY;  Surgeon: Aloha Gell, MD;  Location: [facility];  Service: Gynecology;  Laterality: Bilateral;  . TOOTH EXTRACTION      Family History  Problem Relation Age of Onset  .  Diabetes Mother   . Macular degeneration Mother   . Hyperlipidemia Mother   . Hypertension Mother   . Polycystic kidney disease Father   . Heart disease Father 45       MI, CABG  . Diabetes Father   . Hepatitis Father        C  . Hypothyroidism Father   . Hypertension Father   . Gout Father   . Cancer Maternal Grandfather        bone  . Heart disease Paternal Grandmother   . Heart disease Paternal Grandfather     Social History   Socioeconomic History  . Marital status: Single    Spouse name: Not on file  .  Number of children: 0  . Years of education: Not on file  . Highest education level: Not on file  Occupational History  . Occupation: bartender  Tobacco Use  . Smoking status: Former    Packs/day: 1.00    Years: 18.00    Total pack years: 18.00    Types: Cigarettes    Quit date: 07/02/2014    Years since quitting: 7.7  . Smokeless tobacco: Never  Vaping Use  . Vaping Use: Never used  Substance and Sexual Activity  . Alcohol use: Not Currently  . Drug use: Yes    Types: Marijuana    Comment: marijuana use  - last use 2 yrs ago  . Sexual activity: Never    Partners: Male    Birth control/protection: Surgical  Other Topics Concern  . Not on file  Social History Narrative  . Not on file   Social Determinants of Health   Financial Resource Strain: Not on file  Food Insecurity: Not on file  Transportation Needs: Not on file  Physical Activity: Not on file  Stress: Not on file  Social Connections: Not on file  Intimate Partner Violence: Not on file    Outpatient Medications Prior to Visit  Medication Sig Dispense Refill  . amitriptyline (ELAVIL) 100 MG tablet TAKE 1 TABLET BY MOUTH EVERYDAY AT BEDTIME 90 tablet 1  . aspirin EC 81 MG tablet Take 81 mg by mouth daily. Swallow whole.    Marland Kitchen aspirin-acetaminophen-caffeine (EXCEDRIN MIGRAINE) 250-250-65 MG per tablet Take 1 tablet by mouth every 6 (six) hours as needed for headache or migraine. \    . Calcium Carbonate-Vit D-Min (CALCIUM 1200 PO) Take by mouth.    . cholecalciferol (VITAMIN D3) 25 MCG (1000 UNIT) tablet Take 1,000 Units by mouth daily.    . Cyanocobalamin (B-12) 50 MCG TABS Take 50 mcg by mouth daily.     . DULoxetine (CYMBALTA) 60 MG capsule Take 120 mg by mouth daily.    Marland Kitchen estradiol (VIVELLE-DOT) 0.1 MG/24HR patch 1 patch 2 (two) times a week.    . ferrous sulfate 325 (65 FE) MG tablet Take 325 mg by mouth daily with breakfast.    . gabapentin (NEURONTIN) 300 MG capsule Take 1 capsule (300 mg total) by mouth 3  (three) times daily. 270 capsule 1  . hydrOXYzine (VISTARIL) 25 MG capsule TAKE 1 TO 2 CAPSULES BY MOUTH 1 HOUR BEFORE SLEEP    . Multiple Vitamin (MULTI-DAY VITAMINS PO) Take 1 tablet by mouth daily.    . vitamin C (ASCORBIC ACID) 500 MG tablet Take 500 mg by mouth daily.     No facility-administered medications prior to visit.    Allergies  Allergen Reactions  . Latex Itching and Rash      Review  of Systems  Respiratory:  Negative for shortness of breath.   Cardiovascular:  Negative for chest pain and palpitations.  Gastrointestinal:  Negative for constipation and diarrhea.  Genitourinary:  Negative for dysuria, frequency and urgency.  Musculoskeletal:  Negative for myalgias.  Psychiatric/Behavioral:  Negative for depression and suicidal ideas.   All other systems reviewed and are negative.    Objective:    Physical Exam  Gen: NAD, resting comfortably HEENT: TMs normal bilaterally. OP clear. No thyromegaly noted.  CV: RRR with no murmurs appreciated Pulm: NWOB, CTAB with no crackles, wheezes, or rhonchi GI: Normal bowel sounds present. Soft, Nontender, Nondistended. MSK: no edema, cyanosis, or clubbing noted Skin: warm, dry Psych: Normal affect and thought content  BP 108/76   Pulse 93   Temp 98.6 F (37 C)   Resp 16   Ht '5\' 2"'$  (1.575 m)   Wt 153 lb 2 oz (69.5 kg)   LMP 11/01/2014 (Approximate)   SpO2 94%   BMI 28.01 kg/m  Wt Readings from Last 3 Encounters:  03/30/22 153 lb 2 oz (69.5 kg)  12/26/21 148 lb 8 oz (67.4 kg)  12/19/21 149 lb (67.6 kg)     Health Maintenance Due  Topic Date Due  . COVID-19 Vaccine (3 - Pfizer series) 12/07/2019  . INFLUENZA VACCINE  01/30/2022    There are no preventive care reminders to display for this patient.  Lab Results  Component Value Date   TSH 1.07 09/29/2015   Lab Results  Component Value Date   WBC 8.3 09/27/2021   HGB 12.2 09/27/2021   HCT 36.1 09/27/2021   MCV 89.8 09/27/2021   PLT 323.0 09/27/2021    Lab Results  Component Value Date   NA 134 (L) 09/27/2021   K 4.3 09/27/2021   CO2 30 09/27/2021   GLUCOSE 69 (L) 09/27/2021   BUN 9 09/27/2021   CREATININE 0.80 09/27/2021   BILITOT 0.2 09/27/2021   ALKPHOS 75 09/27/2021   AST 16 09/27/2021   ALT 16 09/27/2021   PROT 6.3 09/27/2021   ALBUMIN 4.1 09/27/2021   CALCIUM 9.3 09/27/2021   ANIONGAP 4 (L) 11/12/2014   GFR 88.83 09/27/2021   Lab Results  Component Value Date   CHOL 245 (H) 09/27/2021   Lab Results  Component Value Date   HDL 65.50 09/27/2021   Lab Results  Component Value Date   LDLCALC 104 (H) 07/28/2020   Lab Results  Component Value Date   TRIG 202.0 (H) 09/27/2021   Lab Results  Component Value Date   CHOLHDL 4 09/27/2021   No results found for: "HGBA1C"    Assessment & Plan:   Problem List Items Addressed This Visit       Nervous and Auditory   Cubital tunnel syndrome of both upper extremities    No orders of the defined types were placed in this encounter.   Follow-up: No follow-ups on file.    Eugenia Pancoast, FNP

## 2022-03-30 NOTE — Patient Instructions (Addendum)
  Start nightly zyrtec to see if this helps with ear sensation that comes and goes.

## 2022-04-01 DIAGNOSIS — F411 Generalized anxiety disorder: Secondary | ICD-10-CM | POA: Insufficient documentation

## 2022-04-01 NOTE — Assessment & Plan Note (Signed)
Advised to start iron daily as this may help with fatigue.  Ordering cbc ibc ferritin pending results

## 2022-04-01 NOTE — Assessment & Plan Note (Signed)
Worsening  Order tsh pending results Continue elavil 100 mg  Focus on water intake at 64 oz daily  D/w pt on estradiol, would like to titrate down to d/c as pt with increased migraines and also a smoker with history of DVT , advised pt to f/u with Gyn in regards to this as well

## 2022-04-01 NOTE — Assessment & Plan Note (Signed)
Ref placed for therapy  Work on anxiety reducing techniques

## 2022-04-03 DIAGNOSIS — N3946 Mixed incontinence: Secondary | ICD-10-CM | POA: Diagnosis not present

## 2022-04-03 DIAGNOSIS — N3010 Interstitial cystitis (chronic) without hematuria: Secondary | ICD-10-CM | POA: Diagnosis not present

## 2022-04-19 ENCOUNTER — Telehealth: Payer: Self-pay | Admitting: Family

## 2022-04-19 DIAGNOSIS — N181 Chronic kidney disease, stage 1: Secondary | ICD-10-CM | POA: Diagnosis not present

## 2022-04-19 DIAGNOSIS — Q613 Polycystic kidney, unspecified: Secondary | ICD-10-CM | POA: Diagnosis not present

## 2022-04-19 DIAGNOSIS — H524 Presbyopia: Secondary | ICD-10-CM | POA: Diagnosis not present

## 2022-04-19 DIAGNOSIS — E611 Iron deficiency: Secondary | ICD-10-CM | POA: Diagnosis not present

## 2022-04-19 DIAGNOSIS — E7800 Pure hypercholesterolemia, unspecified: Secondary | ICD-10-CM | POA: Diagnosis not present

## 2022-04-19 DIAGNOSIS — R7303 Prediabetes: Secondary | ICD-10-CM | POA: Diagnosis not present

## 2022-04-19 DIAGNOSIS — N3010 Interstitial cystitis (chronic) without hematuria: Secondary | ICD-10-CM | POA: Diagnosis not present

## 2022-04-19 NOTE — Telephone Encounter (Signed)
Left message for patient to call back and schedule Medicare Annual Wellness Visit (AWV) either virtually or phone  Left  my jabber number [phone]   Last AWV 04/04/20    45 min for awv-i and in office appointments 30 min for awv-s  phone/virtual appointments

## 2022-04-20 ENCOUNTER — Encounter: Payer: Self-pay | Admitting: Nephrology

## 2022-04-24 ENCOUNTER — Encounter: Payer: Self-pay | Admitting: Nephrology

## 2022-05-08 ENCOUNTER — Other Ambulatory Visit (HOSPITAL_COMMUNITY): Payer: Self-pay | Admitting: Nephrology

## 2022-05-08 DIAGNOSIS — Q613 Polycystic kidney, unspecified: Secondary | ICD-10-CM

## 2022-05-22 ENCOUNTER — Ambulatory Visit (HOSPITAL_COMMUNITY): Admission: RE | Admit: 2022-05-22 | Payer: Medicare HMO | Source: Ambulatory Visit

## 2022-05-22 ENCOUNTER — Encounter (HOSPITAL_COMMUNITY): Payer: Self-pay

## 2022-05-22 NOTE — Progress Notes (Deleted)
NEUROLOGY CONSULTATION NOTE  Sharon Bowman MRN: [id number] DOB: Mar 30, 1976  Referring provider: Eugenia Pancoast, FNP Primary care provider: Eugenia Pancoast, FNP  Reason for consult:  migraines  Assessment/Plan:   ***   Subjective:  Sharon Bowman is a 46 year old ***-handed female with endometriosis, fibromyalgia, depression, anxiety, and history of DVT in right calf who presents for migraines.  History supplemented by referring provider's note.  Onset:  *** Location:  *** Quality:  *** Intensity:  ***.  *** denies new headache, thunderclap headache or severe headache that wakes *** from sleep. Aura:  *** Prodrome:  *** Postdrome:  *** Associated symptoms:  ***.  *** denies associated unilateral numbness or weakness. Duration:  *** Frequency:  *** Frequency of abortive medication: *** Triggers:  *** Relieving factors:  *** Activity:  ***  Past NSAIDS/analgesics:  tramadol Past abortive triptans:  *** Past abortive ergotamine:  *** Past muscle relaxants:  cylclobenzaprine Past anti-emetic:  *** Past antihypertensive medications:  *** Past antidepressant medications:  sertraline Past anticonvulsant medications:  *** Past anti-CGRP:  *** Past vitamins/Herbal/Supplements:  *** Past antihistamines/decongestants:  *** Other past therapies:  ***  Current NSAIDS/analgesics:  ASA '81mg'$  daily, Excedrin Migraine Current triptans:  *** Current ergotamine:  *** Current anti-emetic:  *** Current muscle relaxants:  *** Current Antihypertensive medications:  *** Current Antidepressant medications:  amitriptyline '100mg'$  QHS, duloxetine '120mg'$  daily Current Anticonvulsant medications:  gabapentin '300mg'$  TID Current anti-CGRP:  *** Current Vitamins/Herbal/Supplements:  B12, Ca-D, MVI, C, ferrous sulfate Current Antihistamines/Decongestants:  *** Other therapy:  *** Other medications:  hydroxyzine   Caffeine:  *** Alcohol:  *** Smoker:  *** Diet:  *** Exercise:   *** Depression:  ***; Anxiety:  *** Other pain:  *** Sleep hygiene:  *** Family history of headache:  ***      PAST MEDICAL HISTORY: Past Medical History:  Diagnosis Date   Allergic asthma 08/2015   Anemia    Anxiety    Arthritis    osetoarthritis   Chronic sinusitis    Depression    DVT (deep venous thrombosis) ([place]) 04/2014   right leg - behind calf, no treated with aspirin daily   Endometriosis    Family history of premature CAD    Fibromyalgia 2017   Former smoker    GAD (generalized anxiety disorder)    Headache    History of PFTs 08/2015   normal   Interstitial cystitis    PCOS (polycystic ovarian syndrome)    Polycystic kidney disease     PAST SURGICAL HISTORY: Past Surgical History:  Procedure Laterality Date   ABDOMINAL HYSTERECTOMY     BLADDER SURGERY     BLADDER STRETCH X 3   carpel radial tunnel  2014   carpel tunnel     left  and right hand    CYSTO WITH HYDRODISTENSION N/A 11/11/2014   Procedure: CYSTOSCOPY/HYDRODISTENSION MARCAINE AND PYRIDIUM AND Esmeralda Links;  Surgeon: Carolan Clines, MD;  Location: [facility];  Service: Urology;  Laterality: N/A;   CYSTOSCOPY N/A 11/11/2014   Procedure: CYSTOSCOPY;  Surgeon: Aloha Gell, MD;  Location: [facility];  Service: Gynecology;  Laterality: N/A;   CYSTOSCOPY WITH RETROGRADE PYELOGRAM, URETEROSCOPY AND STENT PLACEMENT Bilateral 11/11/2014   Procedure:  RETROGRADE PYELOGRAM with BILATERAL URETERAL CATHETHERS;  Surgeon: Carolan Clines, MD;  Location: [facility];  Service: Urology;  Laterality: Bilateral;   IUD REMOVAL N/A 11/11/2014   Procedure: INTRAUTERINE DEVICE (IUD) REMOVAL;  Surgeon: Aloha Gell, MD;  Location: [facility];  Service: Gynecology;  Laterality: N/A;   LAPAROSCOPY     X 2 ENDOMETRIOSIS.   LEG SURGERY  04/2014   right knee - tumor - benighn   ROBOTIC ASSISTED LAPAROSCOPIC LYSIS OF ADHESION N/A 11/11/2014   Procedure: ROBOTIC ASSISTED LAPAROSCOPIC LYSIS OF ADHESION;  Surgeon: Aloha Gell, MD;   Location: [facility];  Service: Gynecology;  Laterality: N/A;   ROBOTIC ASSISTED TOTAL HYSTERECTOMY WITH BILATERAL SALPINGO OOPHERECTOMY Bilateral 11/11/2014   Procedure: ROBOTIC ASSISTED TOTAL HYSTERECTOMY WITH BILATERAL SALPINGO OOPHORECTOMY;  Surgeon: Aloha Gell, MD;  Location: [facility];  Service: Gynecology;  Laterality: Bilateral;   TOOTH EXTRACTION      MEDICATIONS: Current Outpatient Medications on File Prior to Visit  Medication Sig Dispense Refill   amitriptyline (ELAVIL) 100 MG tablet TAKE 1 TABLET BY MOUTH EVERYDAY AT BEDTIME 90 tablet 1   aspirin EC 81 MG tablet Take 81 mg by mouth daily. Swallow whole.     aspirin-acetaminophen-caffeine (EXCEDRIN MIGRAINE) 250-250-65 MG per tablet Take 1 tablet by mouth every 6 (six) hours as needed for headache or migraine. \     Calcium Carbonate-Vit D-Min (CALCIUM 1200 PO) Take by mouth.     cholecalciferol (VITAMIN D3) 25 MCG (1000 UNIT) tablet Take 1,000 Units by mouth daily.     Cyanocobalamin (B-12) 50 MCG TABS Take 50 mcg by mouth daily.      DULoxetine (CYMBALTA) 60 MG capsule Take 120 mg by mouth daily.     estradiol (VIVELLE-DOT) 0.1 MG/24HR patch 1 patch 2 (two) times a week.     ferrous sulfate 325 (65 FE) MG tablet Take 325 mg by mouth daily with breakfast.     gabapentin (NEURONTIN) 300 MG capsule Take 1 capsule (300 mg total) by mouth 3 (three) times daily. 270 capsule 1   hydrOXYzine (VISTARIL) 25 MG capsule TAKE 1 TO 2 CAPSULES BY MOUTH 1 HOUR BEFORE SLEEP     Multiple Vitamin (MULTI-DAY VITAMINS PO) Take 1 tablet by mouth daily.     vitamin C (ASCORBIC ACID) 500 MG tablet Take 500 mg by mouth daily.     No current facility-administered medications on file prior to visit.    ALLERGIES: Allergies  Allergen Reactions   Latex Itching and Rash    FAMILY HISTORY: Family History  Problem Relation Age of Onset   Diabetes Mother    Macular degeneration Mother    Hyperlipidemia Mother    Hypertension Mother    Polycystic  kidney disease Father    Heart disease Father 58       MI, CABG   Diabetes Father    Hepatitis Father        C   Hypothyroidism Father    Hypertension Father    Gout Father    Cancer Maternal Grandfather        bone   Heart disease Paternal Grandmother    Heart disease Paternal Grandfather     Objective:  *** General: No acute distress.  Patient appears well-groomed.   Head:  Normocephalic/atraumatic Eyes:  fundi examined but not visualized Neck: supple, no paraspinal tenderness, full range of motion Back: No paraspinal tenderness Heart: regular rate and rhythm Lungs: Clear to auscultation bilaterally. Vascular: No carotid bruits. Neurological Exam: Mental status: alert and oriented to person, place, and time, speech fluent and not dysarthric, language intact. Cranial nerves: CN I: not tested CN II: pupils equal, round and reactive to light, visual fields intact CN III, IV, VI:  full range of motion, no nystagmus, no ptosis CN V:  facial sensation intact. CN VII: upper and lower face symmetric CN VIII: hearing intact CN IX, X: gag intact, uvula midline CN XI: sternocleidomastoid and trapezius muscles intact CN XII: tongue midline Bulk & Tone: normal, no fasciculations. Motor:  muscle strength 5/5 throughout Sensation:  Pinprick, temperature and vibratory sensation intact. Deep Tendon Reflexes:  2+ throughout,  toes downgoing.   Finger to nose testing:  Without dysmetria.   Heel to shin:  Without dysmetria.   Gait:  Normal station and stride.  Romberg negative.    Thank you for allowing me to take part in the care of this patient.  Metta Clines, DO  CC: ***

## 2022-05-27 ENCOUNTER — Ambulatory Visit (HOSPITAL_COMMUNITY): Payer: Medicare HMO

## 2022-05-29 ENCOUNTER — Ambulatory Visit: Payer: Medicare HMO | Admitting: Neurology

## 2022-06-05 DIAGNOSIS — Z6828 Body mass index (BMI) 28.0-28.9, adult: Secondary | ICD-10-CM | POA: Diagnosis not present

## 2022-06-05 DIAGNOSIS — Z113 Encounter for screening for infections with a predominantly sexual mode of transmission: Secondary | ICD-10-CM | POA: Diagnosis not present

## 2022-06-05 DIAGNOSIS — E8941 Symptomatic postprocedural ovarian failure: Secondary | ICD-10-CM | POA: Diagnosis not present

## 2022-06-05 DIAGNOSIS — Z1231 Encounter for screening mammogram for malignant neoplasm of breast: Secondary | ICD-10-CM | POA: Diagnosis not present

## 2022-06-05 DIAGNOSIS — Z01411 Encounter for gynecological examination (general) (routine) with abnormal findings: Secondary | ICD-10-CM | POA: Diagnosis not present

## 2022-06-05 DIAGNOSIS — Z124 Encounter for screening for malignant neoplasm of cervix: Secondary | ICD-10-CM | POA: Diagnosis not present

## 2022-06-05 DIAGNOSIS — Z90711 Acquired absence of uterus with remaining cervical stump: Secondary | ICD-10-CM | POA: Diagnosis not present

## 2022-06-05 DIAGNOSIS — Z0142 Encounter for cervical smear to confirm findings of recent normal smear following initial abnormal smear: Secondary | ICD-10-CM | POA: Diagnosis not present

## 2022-06-05 DIAGNOSIS — Z01419 Encounter for gynecological examination (general) (routine) without abnormal findings: Secondary | ICD-10-CM | POA: Diagnosis not present

## 2022-06-05 LAB — HM MAMMOGRAPHY

## 2022-06-07 ENCOUNTER — Encounter: Payer: Self-pay | Admitting: Family

## 2022-07-17 ENCOUNTER — Telehealth: Payer: Self-pay | Admitting: Family

## 2022-07-17 NOTE — Telephone Encounter (Signed)
LVM for pt to rtn my call to schedule AWV with [facility] call back # [phone] 

## 2022-10-02 DIAGNOSIS — M25461 Effusion, right knee: Secondary | ICD-10-CM | POA: Diagnosis not present

## 2022-10-02 DIAGNOSIS — M25562 Pain in left knee: Secondary | ICD-10-CM | POA: Diagnosis not present

## 2022-11-15 ENCOUNTER — Other Ambulatory Visit: Payer: Self-pay | Admitting: Family

## 2022-11-15 DIAGNOSIS — M1712 Unilateral primary osteoarthritis, left knee: Secondary | ICD-10-CM | POA: Diagnosis not present

## 2022-11-15 DIAGNOSIS — G5623 Lesion of ulnar nerve, bilateral upper limbs: Secondary | ICD-10-CM

## 2022-11-16 NOTE — Telephone Encounter (Signed)
gabapentin (NEURONTIN) 300 MG capsule   LR- 03/30/22 ( 270 caps/ 1 refill) LV- 03/30/22 NV- Not Scheduled

## 2022-11-29 ENCOUNTER — Emergency Department (HOSPITAL_COMMUNITY): Payer: Medicare HMO

## 2022-11-29 ENCOUNTER — Emergency Department (HOSPITAL_COMMUNITY)
Admission: EM | Admit: 2022-11-29 | Discharge: 2022-11-30 | Disposition: A | Discharge status: Other Acute Inpatient Hospital | Payer: Medicare HMO | Attending: Emergency Medicine | Admitting: Emergency Medicine

## 2022-11-29 DIAGNOSIS — R0902 Hypoxemia: Secondary | ICD-10-CM | POA: Diagnosis not present

## 2022-11-29 DIAGNOSIS — F332 Major depressive disorder, recurrent severe without psychotic features: Secondary | ICD-10-CM | POA: Insufficient documentation

## 2022-11-29 DIAGNOSIS — M5136 Other intervertebral disc degeneration, lumbar region: Secondary | ICD-10-CM | POA: Insufficient documentation

## 2022-11-29 DIAGNOSIS — I878 Other specified disorders of veins: Secondary | ICD-10-CM | POA: Diagnosis not present

## 2022-11-29 DIAGNOSIS — K3189 Other diseases of stomach and duodenum: Secondary | ICD-10-CM | POA: Diagnosis not present

## 2022-11-29 DIAGNOSIS — M79671 Pain in right foot: Secondary | ICD-10-CM | POA: Diagnosis not present

## 2022-11-29 DIAGNOSIS — R45851 Suicidal ideations: Secondary | ICD-10-CM

## 2022-11-29 DIAGNOSIS — S80211A Abrasion, right knee, initial encounter: Secondary | ICD-10-CM | POA: Insufficient documentation

## 2022-11-29 DIAGNOSIS — F411 Generalized anxiety disorder: Secondary | ICD-10-CM | POA: Diagnosis present

## 2022-11-29 DIAGNOSIS — T1490XA Injury, unspecified, initial encounter: Secondary | ICD-10-CM | POA: Diagnosis not present

## 2022-11-29 DIAGNOSIS — S9031XA Contusion of right foot, initial encounter: Secondary | ICD-10-CM | POA: Insufficient documentation

## 2022-11-29 DIAGNOSIS — Z7982 Long term (current) use of aspirin: Secondary | ICD-10-CM | POA: Insufficient documentation

## 2022-11-29 DIAGNOSIS — Y92410 Unspecified street and highway as the place of occurrence of the external cause: Secondary | ICD-10-CM | POA: Diagnosis not present

## 2022-11-29 DIAGNOSIS — R4182 Altered mental status, unspecified: Secondary | ICD-10-CM | POA: Diagnosis not present

## 2022-11-29 DIAGNOSIS — Q613 Polycystic kidney, unspecified: Secondary | ICD-10-CM | POA: Diagnosis not present

## 2022-11-29 DIAGNOSIS — J9811 Atelectasis: Secondary | ICD-10-CM | POA: Insufficient documentation

## 2022-11-29 DIAGNOSIS — M7989 Other specified soft tissue disorders: Secondary | ICD-10-CM | POA: Diagnosis not present

## 2022-11-29 DIAGNOSIS — S9032XA Contusion of left foot, initial encounter: Secondary | ICD-10-CM | POA: Diagnosis not present

## 2022-11-29 DIAGNOSIS — D72829 Elevated white blood cell count, unspecified: Secondary | ICD-10-CM | POA: Diagnosis not present

## 2022-11-29 DIAGNOSIS — M797 Fibromyalgia: Secondary | ICD-10-CM | POA: Diagnosis present

## 2022-11-29 DIAGNOSIS — R000 Tachycardia, unspecified: Secondary | ICD-10-CM | POA: Diagnosis not present

## 2022-11-29 DIAGNOSIS — I959 Hypotension, unspecified: Secondary | ICD-10-CM | POA: Diagnosis not present

## 2022-11-29 DIAGNOSIS — R739 Hyperglycemia, unspecified: Secondary | ICD-10-CM | POA: Insufficient documentation

## 2022-11-29 DIAGNOSIS — I700 Atherosclerosis of aorta: Secondary | ICD-10-CM | POA: Insufficient documentation

## 2022-11-29 DIAGNOSIS — Z91040 Latex allergy status: Secondary | ICD-10-CM | POA: Insufficient documentation

## 2022-11-29 DIAGNOSIS — Z041 Encounter for examination and observation following transport accident: Secondary | ICD-10-CM | POA: Diagnosis not present

## 2022-11-29 DIAGNOSIS — R456 Violent behavior: Secondary | ICD-10-CM | POA: Diagnosis not present

## 2022-11-29 LAB — URINALYSIS, ROUTINE W REFLEX MICROSCOPIC
Bilirubin Urine: NEGATIVE
Glucose, UA: NEGATIVE mg/dL
Hgb urine dipstick: NEGATIVE
Ketones, ur: NEGATIVE mg/dL
Leukocytes,Ua: NEGATIVE
Nitrite: POSITIVE — AB
Protein, ur: NEGATIVE mg/dL
Specific Gravity, Urine: 1.012 (ref 1.005–1.030)
pH: 6 (ref 5.0–8.0)

## 2022-11-29 LAB — RAPID URINE DRUG SCREEN, HOSP PERFORMED
Amphetamines: NOT DETECTED
Barbiturates: NOT DETECTED
Benzodiazepines: POSITIVE — AB
Cocaine: NOT DETECTED
Opiates: NOT DETECTED
Tetrahydrocannabinol: POSITIVE — AB

## 2022-11-29 LAB — COMPREHENSIVE METABOLIC PANEL
ALT: 24 U/L (ref 0–44)
AST: 23 U/L (ref 15–41)
Albumin: 3.7 g/dL (ref 3.5–5.0)
Alkaline Phosphatase: 58 U/L (ref 38–126)
Anion gap: 11 (ref 5–15)
BUN: 13 mg/dL (ref 6–20)
CO2: 24 mmol/L (ref 22–32)
Calcium: 9.2 mg/dL (ref 8.9–10.3)
Chloride: 101 mmol/L (ref 98–111)
Creatinine, Ser: 1.11 mg/dL — ABNORMAL HIGH (ref 0.44–1.00)
GFR, Estimated: >60 mL/min (ref >60)
Glucose, Bld: 178 mg/dL — ABNORMAL HIGH (ref 70–99)
Potassium: 4.2 mmol/L (ref 3.5–5.1)
Sodium: 136 mmol/L (ref 135–145)
Total Bilirubin: 0.6 mg/dL (ref 0.3–1.2)
Total Protein: 6.4 g/dL — ABNORMAL LOW (ref 6.5–8.1)

## 2022-11-29 LAB — CBC
HCT: 32.6 % — ABNORMAL LOW (ref 36.0–46.0)
Hemoglobin: 10.7 g/dL — ABNORMAL LOW (ref 12.0–15.0)
MCH: 29 pg (ref 26.0–34.0)
MCHC: 32.8 g/dL (ref 30.0–36.0)
MCV: 88.3 fL (ref 80.0–100.0)
Platelets: 334 10*3/uL (ref 150–400)
RBC: 3.69 MIL/uL — ABNORMAL LOW (ref 3.87–5.11)
RDW: 17.6 % — ABNORMAL HIGH (ref 11.5–15.5)
WBC: 19.9 10*3/uL — ABNORMAL HIGH (ref 4.0–10.5)
nRBC: 0 % (ref 0.0–0.2)

## 2022-11-29 LAB — I-STAT CHEM 8, ED
BUN: 14 mg/dL (ref 6–20)
Calcium, Ion: 1.2 mmol/L (ref 1.15–1.40)
Chloride: 101 mmol/L (ref 98–111)
Creatinine, Ser: 0.9 mg/dL (ref 0.44–1.00)
Glucose, Bld: 167 mg/dL — ABNORMAL HIGH (ref 70–99)
HCT: 34 % — ABNORMAL LOW (ref 36.0–46.0)
Hemoglobin: 11.6 g/dL — ABNORMAL LOW (ref 12.0–15.0)
Potassium: 4.2 mmol/L (ref 3.5–5.1)
Sodium: 136 mmol/L (ref 135–145)
TCO2: 25 mmol/L (ref 22–32)

## 2022-11-29 LAB — PROTIME-INR
INR: 1 (ref 0.8–1.2)
Prothrombin Time: 13.7 seconds (ref 11.4–15.2)

## 2022-11-29 LAB — SAMPLE TO BLOOD BANK

## 2022-11-29 LAB — I-STAT BETA HCG BLOOD, ED (MC, WL, AP ONLY): I-stat hCG, quantitative: <5 m[IU]/mL (ref <5)

## 2022-11-29 LAB — SALICYLATE LEVEL: Salicylate Lvl: <7 mg/dL — ABNORMAL LOW (ref 7.0–30.0)

## 2022-11-29 LAB — LACTIC ACID, PLASMA: Lactic Acid, Venous: 1.5 mmol/L (ref 0.5–1.9)

## 2022-11-29 LAB — ACETAMINOPHEN LEVEL: Acetaminophen (Tylenol), Serum: <10 ug/mL — ABNORMAL LOW (ref 10–30)

## 2022-11-29 LAB — ETHANOL: Alcohol, Ethyl (B): <10 mg/dL (ref <10)

## 2022-11-29 MED ORDER — SODIUM CHLORIDE 0.9 % IV BOLUS
500.0000 mL | Freq: Once | INTRAVENOUS | Status: AC
  Administered 2022-11-29: 500 mL via INTRAVENOUS

## 2022-11-29 MED ORDER — SODIUM CHLORIDE 0.9 % IV SOLN
1.0000 g | Freq: Once | INTRAVENOUS | Status: AC
  Administered 2022-11-29: 1 g via INTRAVENOUS
  Filled 2022-11-29: qty 10

## 2022-11-29 MED ORDER — IOHEXOL 350 MG/ML SOLN
75.0000 mL | Freq: Once | INTRAVENOUS | Status: AC | PRN
  Administered 2022-11-29: 75 mL via INTRAVENOUS

## 2022-11-29 MED ORDER — CEPHALEXIN 250 MG PO CAPS
500.0000 mg | ORAL_CAPSULE | Freq: Three times a day (TID) | ORAL | Status: DC
  Administered 2022-11-29 – 2022-11-30 (×3): 500 mg via ORAL
  Filled 2022-11-29 (×3): qty 2

## 2022-11-29 NOTE — ED Notes (Signed)
Family going home at this time.  Mother/sister wishes to speak with Psychiatry during assessment.
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
## 2022-11-29 NOTE — ED Notes (Signed)
Family updated as to patient's status and present at bedside.

## 2022-11-29 NOTE — ED Notes (Signed)
Pat(mother) requesting a call back for update [phone]
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
## 2022-11-30 ENCOUNTER — Encounter (HOSPITAL_COMMUNITY): Payer: Self-pay | Admitting: Adult Health

## 2022-11-30 ENCOUNTER — Inpatient Hospital Stay (HOSPITAL_COMMUNITY)
Admission: AD | Admit: 2022-11-30 | Discharge: 2022-12-08 | DRG: 885 | Disposition: A | Discharge status: Home / Self Care | Payer: Medicare HMO | Source: Intra-hospital | Attending: Psychiatry | Admitting: Psychiatry

## 2022-11-30 ENCOUNTER — Other Ambulatory Visit: Payer: Self-pay

## 2022-11-30 DIAGNOSIS — G8929 Other chronic pain: Secondary | ICD-10-CM | POA: Diagnosis present

## 2022-11-30 DIAGNOSIS — Z9151 Personal history of suicidal behavior: Secondary | ICD-10-CM | POA: Diagnosis not present

## 2022-11-30 DIAGNOSIS — S9031XA Contusion of right foot, initial encounter: Secondary | ICD-10-CM | POA: Diagnosis not present

## 2022-11-30 DIAGNOSIS — S9032XA Contusion of left foot, initial encounter: Secondary | ICD-10-CM | POA: Diagnosis not present

## 2022-11-30 DIAGNOSIS — R45851 Suicidal ideations: Secondary | ICD-10-CM | POA: Diagnosis present

## 2022-11-30 DIAGNOSIS — F332 Major depressive disorder, recurrent severe without psychotic features: Principal | ICD-10-CM | POA: Diagnosis present

## 2022-11-30 DIAGNOSIS — M797 Fibromyalgia: Secondary | ICD-10-CM | POA: Diagnosis not present

## 2022-11-30 DIAGNOSIS — S80211A Abrasion, right knee, initial encounter: Secondary | ICD-10-CM | POA: Diagnosis not present

## 2022-11-30 DIAGNOSIS — R739 Hyperglycemia, unspecified: Secondary | ICD-10-CM | POA: Diagnosis not present

## 2022-11-30 DIAGNOSIS — F1290 Cannabis use, unspecified, uncomplicated: Secondary | ICD-10-CM | POA: Diagnosis not present

## 2022-11-30 DIAGNOSIS — R4182 Altered mental status, unspecified: Secondary | ICD-10-CM | POA: Diagnosis not present

## 2022-11-30 DIAGNOSIS — J9811 Atelectasis: Secondary | ICD-10-CM | POA: Diagnosis not present

## 2022-11-30 DIAGNOSIS — D72829 Elevated white blood cell count, unspecified: Secondary | ICD-10-CM | POA: Diagnosis not present

## 2022-11-30 DIAGNOSIS — Z91040 Latex allergy status: Secondary | ICD-10-CM | POA: Diagnosis not present

## 2022-11-30 DIAGNOSIS — Z7982 Long term (current) use of aspirin: Secondary | ICD-10-CM | POA: Diagnosis not present

## 2022-11-30 DIAGNOSIS — Z87891 Personal history of nicotine dependence: Secondary | ICD-10-CM | POA: Diagnosis not present

## 2022-11-30 DIAGNOSIS — Z86718 Personal history of other venous thrombosis and embolism: Secondary | ICD-10-CM | POA: Diagnosis not present

## 2022-11-30 HISTORY — DX: Endometriosis, unspecified: N80.9

## 2022-11-30 LAB — URINE CULTURE

## 2022-11-30 MED ORDER — VITAMIN C 500 MG PO TABS
500.0000 mg | ORAL_TABLET | Freq: Every day | ORAL | Status: DC
  Administered 2022-11-30: 500 mg via ORAL
  Filled 2022-11-30: qty 1

## 2022-11-30 MED ORDER — ADULT MULTIVITAMIN W/MINERALS CH
1.0000 | ORAL_TABLET | Freq: Every day | ORAL | Status: DC
  Administered 2022-11-30: 1 via ORAL
  Filled 2022-11-30: qty 1

## 2022-11-30 MED ORDER — HALOPERIDOL LACTATE 5 MG/ML IJ SOLN: 5.0000 mg | Freq: Three times a day (TID) | INTRAMUSCULAR | Status: DC | PRN

## 2022-11-30 MED ORDER — DULOXETINE HCL 60 MG PO CPEP
60.0000 mg | ORAL_CAPSULE | Freq: Every day | ORAL | Status: DC
  Administered 2022-12-01: 60 mg via ORAL
  Filled 2022-11-30 (×4): qty 1

## 2022-11-30 MED ORDER — ACETAMINOPHEN 325 MG PO TABS
650.0000 mg | ORAL_TABLET | Freq: Four times a day (QID) | ORAL | Status: DC | PRN
  Administered 2022-12-01 – 2022-12-05 (×7): 650 mg via ORAL
  Filled 2022-11-30 (×7): qty 2

## 2022-11-30 MED ORDER — ACETAMINOPHEN 325 MG PO TABS
650.0000 mg | ORAL_TABLET | Freq: Four times a day (QID) | ORAL | Status: DC | PRN
Start: 2022-11-30 — End: 2022-11-30

## 2022-11-30 MED ORDER — HYDROXYZINE HCL 25 MG PO TABS: 25.0000 mg | ORAL_TABLET | Freq: Every evening | ORAL | Status: DC | PRN

## 2022-11-30 MED ORDER — ESTRADIOL 0.1 MG/24HR TD PTTW: 0.1000 mg | MEDICATED_PATCH | TRANSDERMAL | Status: DC

## 2022-11-30 MED ORDER — FERROUS SULFATE 325 (65 FE) MG PO TABS
325.0000 mg | ORAL_TABLET | Freq: Every day | ORAL | Status: DC
  Administered 2022-11-30: 325 mg via ORAL
  Filled 2022-11-30: qty 1

## 2022-11-30 MED ORDER — ASPIRIN 81 MG PO TBEC
81.0000 mg | DELAYED_RELEASE_TABLET | Freq: Every day | ORAL | Status: DC
  Administered 2022-11-30: 81 mg via ORAL
  Filled 2022-11-30: qty 1

## 2022-11-30 MED ORDER — DIPHENHYDRAMINE HCL 25 MG PO CAPS: 50.0000 mg | ORAL_CAPSULE | Freq: Three times a day (TID) | ORAL | Status: DC | PRN

## 2022-11-30 MED ORDER — DIPHENHYDRAMINE HCL 50 MG/ML IJ SOLN: 50.0000 mg | Freq: Three times a day (TID) | INTRAMUSCULAR | Status: DC | PRN

## 2022-11-30 MED ORDER — VITAMIN B-12 100 MCG PO TABS
50.0000 ug | ORAL_TABLET | Freq: Every day | ORAL | Status: DC
  Administered 2022-12-01 – 2022-12-08 (×8): 50 ug via ORAL
  Filled 2022-11-30 (×11): qty 1

## 2022-11-30 MED ORDER — LORAZEPAM 2 MG/ML IJ SOLN: 2.0000 mg | Freq: Three times a day (TID) | INTRAMUSCULAR | Status: DC | PRN

## 2022-11-30 MED ORDER — ACETAMINOPHEN 325 MG PO TABS: 650.0000 mg | ORAL_TABLET | Freq: Four times a day (QID) | ORAL | Status: DC | PRN

## 2022-11-30 MED ORDER — MAGNESIUM HYDROXIDE 400 MG/5ML PO SUSP: 30.0000 mL | Freq: Every day | ORAL | Status: DC | PRN

## 2022-11-30 MED ORDER — ASCORBIC ACID 500 MG PO TABS
500.0000 mg | ORAL_TABLET | Freq: Every day | ORAL | Status: DC
  Administered 2022-12-01 – 2022-12-08 (×8): 500 mg via ORAL
  Filled 2022-11-30 (×10): qty 1

## 2022-11-30 MED ORDER — HYDROXYZINE HCL 25 MG PO TABS
25.0000 mg | ORAL_TABLET | Freq: Every evening | ORAL | Status: DC | PRN
  Administered 2022-11-30: 25 mg via ORAL
  Filled 2022-11-30: qty 1

## 2022-11-30 MED ORDER — FERROUS SULFATE 325 (65 FE) MG PO TABS
325.0000 mg | ORAL_TABLET | Freq: Every day | ORAL | Status: DC
  Administered 2022-12-01 – 2022-12-08 (×8): 325 mg via ORAL
  Filled 2022-11-30 (×11): qty 1

## 2022-11-30 MED ORDER — ALUM & MAG HYDROXIDE-SIMETH 200-200-20 MG/5ML PO SUSP: 30.0000 mL | ORAL | Status: DC | PRN

## 2022-11-30 MED ORDER — ADULT MULTIVITAMIN W/MINERALS CH
1.0000 | ORAL_TABLET | Freq: Every day | ORAL | Status: DC
  Administered 2022-12-01 – 2022-12-08 (×8): 1 via ORAL
  Filled 2022-11-30 (×10): qty 1

## 2022-11-30 MED ORDER — GABAPENTIN 300 MG PO CAPS
300.0000 mg | ORAL_CAPSULE | Freq: Three times a day (TID) | ORAL | Status: DC
  Administered 2022-11-30: 300 mg via ORAL
  Filled 2022-11-30: qty 1

## 2022-11-30 MED ORDER — LORAZEPAM 1 MG PO TABS: 2.0000 mg | ORAL_TABLET | Freq: Three times a day (TID) | ORAL | Status: DC | PRN

## 2022-11-30 MED ORDER — VITAMIN B-12 100 MCG PO TABS
50.0000 ug | ORAL_TABLET | Freq: Every day | ORAL | Status: DC
  Filled 2022-11-30: qty 1

## 2022-11-30 MED ORDER — GABAPENTIN 300 MG PO CAPS
300.0000 mg | ORAL_CAPSULE | Freq: Three times a day (TID) | ORAL | Status: DC
  Administered 2022-11-30 – 2022-12-01 (×3): 300 mg via ORAL
  Filled 2022-11-30 (×9): qty 1

## 2022-11-30 MED ORDER — ASPIRIN-ACETAMINOPHEN-CAFFEINE 250-250-65 MG PO TABS: 1.0000 | ORAL_TABLET | Freq: Four times a day (QID) | ORAL | Status: DC | PRN

## 2022-11-30 MED ORDER — ASPIRIN 81 MG PO TBEC
81.0000 mg | DELAYED_RELEASE_TABLET | Freq: Every day | ORAL | Status: DC
  Administered 2022-12-01 – 2022-12-08 (×8): 81 mg via ORAL
  Filled 2022-11-30 (×10): qty 1

## 2022-11-30 MED ORDER — DULOXETINE HCL 60 MG PO CPEP
60.0000 mg | ORAL_CAPSULE | Freq: Every day | ORAL | Status: DC
  Administered 2022-11-30: 60 mg via ORAL
  Filled 2022-11-30: qty 1

## 2022-11-30 MED ORDER — HALOPERIDOL 5 MG PO TABS: 5.0000 mg | ORAL_TABLET | Freq: Three times a day (TID) | ORAL | Status: DC | PRN

## 2022-11-30 MED ORDER — CEPHALEXIN 500 MG PO CAPS
500.0000 mg | ORAL_CAPSULE | Freq: Three times a day (TID) | ORAL | Status: AC
  Administered 2022-11-30 – 2022-12-06 (×19): 500 mg via ORAL
  Filled 2022-11-30 (×22): qty 1

## 2022-11-30 NOTE — Plan of Care (Signed)
  Problem: Education: Goal: Verbalization of understanding the information provided will improve Outcome: Progressing   Problem: Activity: Goal: Interest or engagement in activities will improve Outcome: Progressing Goal: Sleeping patterns will improve Outcome: Progressing   Problem: Coping: Goal: Ability to verbalize frustrations and anger appropriately will improve Outcome: Progressing Goal: Ability to demonstrate self-control will improve Outcome: Progressing   Problem: Health Behavior/Discharge Planning: Goal: Identification of resources available to assist in meeting health care needs will improve Outcome: Progressing Goal: Compliance with treatment plan for underlying cause of condition will improve Outcome: Progressing   Problem: Physical Regulation: Goal: Ability to maintain clinical measurements within normal limits will improve Outcome: Progressing   Problem: Safety: Goal: Periods of time without injury will increase Outcome: Progressing   Problem: Education: Goal: Utilization of techniques to improve thought processes will improve Outcome: Progressing Goal: Knowledge of the prescribed therapeutic regimen will improve Outcome: Progressing   Problem: Activity: Goal: Interest or engagement in leisure activities will improve Outcome: Progressing Goal: Imbalance in normal sleep/wake cycle will improve Outcome: Progressing   Problem: Coping: Goal: Coping ability will improve Outcome: Progressing

## 2022-11-30 NOTE — Progress Notes (Signed)
   11/30/22 1530  Psych Admission Type (Psych Patients Only)  Admission Status Voluntary  Psychosocial Assessment  Patient Complaints Depression;Hopelessness;Irritability;Loneliness;Nervousness;Restlessness;Sadness;Self-harm thoughts;Shakiness  Eye Contact Fair  Facial Expression Sad  Affect Sad  Speech Logical/coherent  Interaction Assertive  Motor Activity Slow  Appearance/Hygiene Disheveled  Behavior Characteristics Cooperative  Mood Depressed  Thought Process  Coherency WDL  Content WDL  Delusions None reported or observed  Perception WDL  Hallucination None reported or observed  Judgment Poor  Confusion None  Danger to Self  Current suicidal ideation? Passive  Self-Injurious Behavior Some self-injurious ideation observed or expressed.  No lethal plan expressed   Agreement Not to Harm Self Yes  Description of Agreement verbal  Danger to Others  Danger to Others None reported or observed

## 2022-11-30 NOTE — ED Notes (Signed)
Updated pt's mother per pt's request. Updated on plan of care and requirements during visitation. Pt's mother to bring pt's glasses which this RN approved.

## 2022-11-30 NOTE — Tx Team (Signed)
Initial Treatment Plan 11/30/2022 5:53 PM AHMYRA WOODLE ZOX:[id number]    PATIENT STRESSORS: Financial difficulties   Other: breakup with x fiance     PATIENT STRENGTHS: General fund of knowledge  Motivation for treatment/growth  Supportive family/friends    PATIENT IDENTIFIED PROBLEMS: MDD                     DISCHARGE CRITERIA:  Improved stabilization in mood, thinking, and/or behavior Motivation to continue treatment in a less acute level of care Need for constant or close observation no longer present Verbal commitment to aftercare and medication compliance  PRELIMINARY DISCHARGE PLAN: Outpatient therapy Return to previous living arrangement  PATIENT/FAMILY INVOLVEMENT: This treatment plan has been presented to and reviewed with the patient, SITLALI FAZEL.  The patient and family have been given the opportunity to ask questions and make suggestions.  Laurance Flatten, RN 11/30/2022, 5:53 PM
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
## 2022-11-30 NOTE — Progress Notes (Signed)
Pt was accepted to [facility] TODAY 11/30/2022. Bed assignment: 403-1  Pt meets inpatient criteria per Ophelia Shoulder, NP  Attending Physician will be Phineas Inches, MD  Report can be called to: - Adult unit: [phone]  Pt can arrive after 3 PM  Care Team Notified: [facility] [place], RN, Ophelia Shoulder, NP, and [place] Nickerson, RN  [place], [place]  11/30/2022 12:32 PM

## 2022-11-30 NOTE — Progress Notes (Signed)
   11/30/22 2045  Psych Admission Type (Psych Patients Only)  Admission Status Voluntary  Psychosocial Assessment  Patient Complaints Depression;Anxiety  Eye Contact Fair  Facial Expression Sad  Affect Apprehensive  Speech Logical/coherent  Interaction Assertive  Motor Activity Slow  Appearance/Hygiene Poor hygiene  Behavior Characteristics Cooperative;Appropriate to situation  Mood Depressed  Thought Process  Coherency WDL  Content WDL  Delusions None reported or observed  Perception WDL  Hallucination None reported or observed  Judgment Poor  Confusion None  Danger to Self  Current suicidal ideation? Passive  Self-Injurious Behavior No self-injurious ideation or behavior indicators observed or expressed   Agreement Not to Harm Self Yes  Description of Agreement pt verbally contrated for safety  Danger to Others  Danger to Others None reported or observed

## 2022-11-30 NOTE — Progress Notes (Signed)
Pt meets inpatient behavioral health placement per Ophelia Shoulder, NP. CSW has requested [facility] Kasandra Knudsen, RN to review. 1st shift CSW to follow up.  Maryjean Ka, MSW, LCSWA 11/30/2022 1:06 AM

## 2022-12-01 DIAGNOSIS — Z87891 Personal history of nicotine dependence: Secondary | ICD-10-CM | POA: Insufficient documentation

## 2022-12-01 DIAGNOSIS — G4700 Insomnia, unspecified: Secondary | ICD-10-CM | POA: Insufficient documentation

## 2022-12-01 DIAGNOSIS — F332 Major depressive disorder, recurrent severe without psychotic features: Secondary | ICD-10-CM | POA: Diagnosis not present

## 2022-12-01 DIAGNOSIS — R4589 Other symptoms and signs involving emotional state: Secondary | ICD-10-CM | POA: Insufficient documentation

## 2022-12-01 DIAGNOSIS — Z79899 Other long term (current) drug therapy: Secondary | ICD-10-CM | POA: Insufficient documentation

## 2022-12-01 DIAGNOSIS — F411 Generalized anxiety disorder: Secondary | ICD-10-CM | POA: Insufficient documentation

## 2022-12-01 LAB — URINE CULTURE: Culture: >=100000 — AB

## 2022-12-01 MED ORDER — BACITRACIN-NEOMYCIN-POLYMYXIN OINTMENT TUBE
TOPICAL_OINTMENT | CUTANEOUS | Status: DC | PRN
  Administered 2022-12-04: 1 via TOPICAL
  Filled 2022-12-01: qty 14.17

## 2022-12-01 MED ORDER — DULOXETINE HCL 30 MG PO CPEP
90.0000 mg | ORAL_CAPSULE | Freq: Every day | ORAL | Status: DC
  Administered 2022-12-02 – 2022-12-08 (×7): 90 mg via ORAL
  Filled 2022-12-01 (×8): qty 3

## 2022-12-01 MED ORDER — ARIPIPRAZOLE 2 MG PO TABS
2.0000 mg | ORAL_TABLET | Freq: Every day | ORAL | Status: DC
  Administered 2022-12-01 – 2022-12-08 (×8): 2 mg via ORAL
  Filled 2022-12-01 (×9): qty 1

## 2022-12-01 MED ORDER — TRAZODONE HCL 50 MG PO TABS
50.0000 mg | ORAL_TABLET | Freq: Every day | ORAL | Status: DC
  Administered 2022-12-01: 50 mg via ORAL
  Filled 2022-12-01 (×2): qty 1

## 2022-12-01 MED ORDER — HYDROXYZINE HCL 25 MG PO TABS
25.0000 mg | ORAL_TABLET | Freq: Four times a day (QID) | ORAL | Status: DC | PRN
  Administered 2022-12-04 – 2022-12-07 (×3): 25 mg via ORAL
  Filled 2022-12-01 (×3): qty 1

## 2022-12-01 MED ORDER — GABAPENTIN 400 MG PO CAPS
400.0000 mg | ORAL_CAPSULE | Freq: Three times a day (TID) | ORAL | Status: DC
  Administered 2022-12-01 – 2022-12-08 (×20): 400 mg via ORAL
  Filled 2022-12-01 (×24): qty 1

## 2022-12-01 NOTE — Group Note (Signed)
LCSW Group Therapy Note  12/01/2022   10:30-11:30am   Topic:  Anger and its Underlying Emotions  Participation Level:  Did Not Attend  Description of Group:   In this group, patients identified the last situation that provoked them to anger.   We then talked about the Anger Iceberg and the way many underlying feelings lead to feelings of anger.  Focus was placed on how helpful it is to recognize the underlying emotions to anger in order to address these for more permanent resolution.  Because the group was supposed to be on social wellness, CSW continually made the connection between our ability to recognize our feelings and our ability to convey our emotional needs to those around [place] in a more helpful manner.    Therapeutic Goals: Patients will share emotions that commonly incite their anger and how they typically respond Patients will identify how their coping skills work for them and/or against them Patients will explore possible alternative thoughts to their automatic ones Patients will learn that anger itself is normal and that healthier reactions can assist with resolving conflict rather than worsening situations  Summary of Patient Progress:  Patient was invited to group, did not attend.  She did arrive for the last 5 minutes of group and listened attentively.  Therapeutic Modalities:   Cognitive Behavioral Therapy Processing  Lynnell Chad, MSW, LCSW
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
## 2022-12-01 NOTE — [facility] Group Notes (Signed)
[facility] Group Notes:  (Nursing/MHT/Case Management/Adjunct)  Date:  12/01/2022  Time:  2000  Type of Therapy:   wrap up group  Participation Level:  Active  Participation Quality:  Appropriate, Attentive, Sharing, and Supportive  Affect:  Depressed  Cognitive:  Alert  Insight:  Improving  Engagement in Group:  Engaged  Modes of Intervention:  Clarification, Education, and Socialization  Summary of Progress/Problems: Positive thinking and self-care were discussed.   [name] 12/01/2022, 9:28 PM

## 2022-12-02 ENCOUNTER — Telehealth (HOSPITAL_BASED_OUTPATIENT_CLINIC_OR_DEPARTMENT_OTHER): Payer: Self-pay | Admitting: *Deleted

## 2022-12-02 LAB — LIPID PANEL
Cholesterol: 208 mg/dL — ABNORMAL HIGH (ref 0–200)
HDL: 58 mg/dL (ref >40)
LDL Cholesterol: 118 mg/dL — ABNORMAL HIGH (ref 0–99)
Total CHOL/HDL Ratio: 3.6 RATIO
Triglycerides: 160 mg/dL — ABNORMAL HIGH (ref <150)
VLDL: 32 mg/dL (ref 0–40)

## 2022-12-02 MED ORDER — TRAZODONE HCL 100 MG PO TABS
100.0000 mg | ORAL_TABLET | Freq: Every day | ORAL | Status: DC
  Administered 2022-12-02 – 2022-12-07 (×6): 100 mg via ORAL
  Filled 2022-12-02 (×10): qty 1

## 2022-12-02 MED ORDER — ESTRADIOL 0.1 MG/24HR TD PTWK
0.1000 mg | MEDICATED_PATCH | TRANSDERMAL | Status: DC
  Administered 2022-12-02: 0.1 mg via TRANSDERMAL
  Filled 2022-12-02: qty 1

## 2022-12-02 NOTE — Group Note (Signed)
Date:  12/02/2022 Time:  3:55 PM  Group Topic/Focus:  Goals Group:   The focus of this group is to help patients establish daily goals to achieve during treatment and discuss how the patient can incorporate goal setting into their daily lives to aide in recovery. Orientation:   The focus of this group is to educate the patient on the purpose and policies of crisis stabilization and provide a format to answer questions about their admission.  The group details unit policies and expectations of patients while admitted.    Participation Level:  Active  Participation Quality:  Appropriate  Affect:  Appropriate  Cognitive:  Appropriate  Insight: Appropriate  Engagement in Group:  Engaged  Modes of Intervention:  Discussion, Orientation, and Support  Additional Comments:   t attended and participated in the Orientation/Goals group. Pt personal goal is to be happy.  Sharon Bowman 12/02/2022, 3:55 PM
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
## 2022-12-02 NOTE — Progress Notes (Signed)
PT Cancellation Note  Patient Details Name: Sharon Bowman MRN: [id number] DOB: [birth year]   Cancelled Treatment:     PT order received. Our OT who covers BH will see and assess pt. If any further PT needs, he will let us know. If he can address all issues then PT will sign off.   Thank you     Elya Tarquinio 12/02/2022, 6:45 AM

## 2022-12-02 NOTE — Group Note (Signed)
Date:  12/02/2022 Time:  5:10 PM  Group Topic/Focus:  Building Self Esteem:   The Focus of this group is helping patients become aware of the effects of self-esteem on their lives, the things they and others do that enhance or undermine their self-esteem, seeing the relationship between their level of self-esteem and the choices they make and learning ways to enhance self-esteem. Dimensions of Wellness:   The focus of this group is to introduce the topic of wellness and discuss the role each dimension of wellness plays in total health. Emotional Education:   The focus of this group is to discuss what feelings/emotions are, and how they are experienced.    Participation Level:  Active  Participation Quality:  Appropriate  Affect:  Appropriate  Cognitive:  Appropriate  Insight: Appropriate  Engagement in Group:  Engaged  Modes of Intervention:  Activity, Education, and Support  Additional Comments:   Pt attended and participated in the Emotional Wellness group. Pt completed the self-esteem and self- love building activity.  Sharon Bowman 12/02/2022, 5:10 PM

## 2022-12-02 NOTE — Progress Notes (Signed)
   12/02/22 1308  15 Minute Checks  Location Bedroom  Visual Appearance Calm  Behavior Sleeping  Sleep (Behavioral Health Patients Only)  Calculate sleep? (Click Yes once per 24 hr at 0600 safety check) Yes  Documented sleep last 24 hours 7

## 2022-12-02 NOTE — Progress Notes (Signed)
Sharon Bowman rates sleep as "Good". She denies SI/HI/AVH. Wound on right knee was assessed, cleansed, and dressed. Pt was given a walker to ambulate and was encourage to be more mobile. Pt appears brighter on approach, was joking with Clinical research associate. Pt took her meds, no new c/o's. Pt remains safe.
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
## 2022-12-02 NOTE — Plan of Care (Signed)
  Problem: Education: Goal: Emotional status will improve Outcome: Progressing   Problem: Education: Goal: Utilization of techniques to improve thought processes will improve Outcome: Progressing   Problem: Education: Goal: Knowledge of the prescribed therapeutic regimen will improve Outcome: Progressing   Problem: Coping: Goal: Coping ability will improve Outcome: Progressing

## 2022-12-02 NOTE — Telephone Encounter (Signed)
Post ED Visit - Positive Culture Follow-up  Culture report reviewed by antimicrobial stewardship pharmacist: [facility] []  Enzo Bi, Pharm.D. []  Celedonio Miyamoto, Pharm.D., BCPS AQ-ID []  Garvin Fila, Pharm.D., BCPS []  Georgina Pillion, Pharm.D., BCPS []  [place], [place].D., BCPS, AAHIVP []  Estella Husk, Pharm.D., BCPS, AAHIVP []  Lysle Pearl, PharmD, BCPS []  Phillips Climes, PharmD, BCPS []  Agapito Games, PharmD, BCPS []  Verlan Friends, PharmD []  Mervyn Gay, PharmD, BCPS [x]  Riccardo Dubin, PharmD  [facility] []  Len Childs, PharmD []  Greer Pickerel, PharmD []  Adalberto Cole, PharmD []  Perlie Gold, Rph []  Lonell Face) Jean Rosenthal, PharmD []  Earl Many, PharmD []  Junita Push, PharmD []  Dorna Leitz, PharmD []  Terrilee Files, PharmD []  Lynann Beaver, PharmD []  Keturah Barre, PharmD []  Loralee Pacas, PharmD []  Bernadene Person, PharmD   Positive urine culture Treated with Keflex, organism sensitive to the same and no further patient follow-up is required at this time.  Sharon Bowman 12/02/2022, 12:14 PM

## 2022-12-02 NOTE — [facility] Group Notes (Signed)
[facility] Group Notes:  (Nursing)  Date:  12/02/2022  Time:  1415  Type of Therapy:  Psychoeducational Skills  Participation Level:  Active  Participation Quality:  Attentive  Affect:  Appropriate  Cognitive:  Alert and Appropriate  Insight:  Appropriate  Engagement in Group:  Engaged  Modes of Intervention:  Activity, Rapport Building, Socialization, and Support  Summary of Progress/Problems:  Shela Nevin 12/02/2022, 3:55 PM

## 2022-12-02 NOTE — Progress Notes (Signed)
   12/01/22 2130  Psych Admission Type (Psych Patients Only)  Admission Status Voluntary  Psychosocial Assessment  Patient Complaints Anxiety;Depression  Eye Contact Fair  Facial Expression Sad  Affect Depressed  Speech Logical/coherent  Interaction Assertive  Motor Activity Slow  Appearance/Hygiene Disheveled  Behavior Characteristics Cooperative;Appropriate to situation  Mood Depressed  Thought Process  Coherency WDL  Content WDL  Delusions None reported or observed  Perception WDL  Hallucination None reported or observed  Judgment Limited  Confusion None  Danger to Self  Current suicidal ideation? Passive  Self-Injurious Behavior No self-injurious ideation or behavior indicators observed or expressed   Agreement Not to Harm Self Yes  Description of Agreement Verbal  Danger to Others  Danger to Others None reported or observed

## 2022-12-02 NOTE — Plan of Care (Signed)
  Problem: Education: Goal: Emotional status will improve Outcome: Progressing   Problem: Education: Goal: Verbalization of understanding the information provided will improve Outcome: Progressing   Problem: Activity: Goal: Sleeping patterns will improve Outcome: Progressing   Problem: Safety: Goal: Periods of time without injury will increase Outcome: Progressing   Problem: Coping: Goal: Coping ability will improve Outcome: Progressing

## 2022-12-03 ENCOUNTER — Encounter (HOSPITAL_COMMUNITY): Payer: Self-pay

## 2022-12-03 LAB — HEMOGLOBIN A1C
Hgb A1c MFr Bld: 6.1 % — ABNORMAL HIGH (ref 4.8–5.6)
Mean Plasma Glucose: 128 mg/dL
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
## 2022-12-03 NOTE — Progress Notes (Signed)
Adult Psychoeducational Group Note  Date:  12/03/2022 Time:  1:11 AM  Group Topic/Focus:  Wrap-Up Group:   The focus of this group is to help patients review their daily goal of treatment and discuss progress on daily workbooks.  Participation Level:  Active  Participation Quality:  Appropriate  Affect:  Appropriate  Cognitive:  Appropriate  Insight: Appropriate  Engagement in Group:  Engaged  Modes of Intervention:  Discussion and Support  Additional Comments:  Pt states goal today, was to try to be happy. Pt rates day a 10/10. Pt states something positive that happened today was talking to sister. Pt wants to work on getting her life back on track.  Mareli Antunes Katrinka Blazing 12/03/2022, 1:11 AM

## 2022-12-03 NOTE — Progress Notes (Signed)
Psychoeducational Group Note  Date:  12/03/2022 Time:  2148  Group Topic/Focus:  Relapse Prevention Planning:   The focus of this group is to define relapse and discuss the need for planning to combat relapse.  Participation Level: Did Not Attend  Participation Quality:  Not Applicable  Affect:  Not Applicable  Cognitive:  Not Applicable  Insight:  Not Applicable  Engagement in Group: Not Applicable  Additional Comments:  The patient did not attend group this evening.   Hazle Coca S 12/03/2022, 9:49 PM
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
## 2022-12-03 NOTE — Group Note (Signed)
Occupational Therapy Group Note  Group Topic: Sleep Hygiene  Group Date: 12/03/2022 Start Time: 1430 End Time: 1505 Facilitators: Ted Mcalpine, OT   Group Description: Group encouraged increased participation and engagement through topic focused on sleep hygiene. Patients reflected on the quality of sleep they typically receive and identified areas that need improvement. Group was given background information on sleep and sleep hygiene, including common sleep disorders. Group members also received information on how to improve one's sleep and introduced a sleep diary as a tool that can be utilized to track sleep quality over a length of time. Group session ended with patients identifying one or more strategies they could utilize or implement into their sleep routine in order to improve overall sleep quality.        Therapeutic Goal(s):  Identify one or more strategies to improve overall sleep hygiene  Identify one or more areas of sleep that are negatively impacted (sleep too much, too little, etc)     Participation Level: Engaged   Participation Quality: Independent   Behavior: Appropriate   Speech/Thought Process: Relevant   Affect/Mood: Appropriate   Insight: Good   Judgement: Good      Modes of Intervention: Education  Patient Response to Interventions:  Attentive   Plan: Continue to engage patient in OT groups 2 - 3x/week.  12/03/2022  Ted Mcalpine, OT   Kerrin Champagne, OT

## 2022-12-03 NOTE — Group Note (Signed)
Recreation Therapy Group Note   Group Topic:Team Building  Group Date: 12/03/2022 Start Time: 0930 End Time: 0957 Facilitators: Jhania Etherington-McCall, LRT,CTRS Location: [address] Day[address]   Goal Area(s) Addresses:  Patient will effectively work with peer towards shared goal.  Patient will identify skills used to make activity successful.  Patient will identify how skills used during activity can be applied to reach post d/c goals.   Group Description: [employer]. In teams of 5-6, patients were given 11 craft pipe cleaners. Using the materials provided, patients were instructed to compete again the opposing team(s) to build the tallest free-standing structure from floor level. The activity was timed; difficulty increased by Clinical research associate as Production designer, theatre/television/film continued.  Systematically resources were removed with additional directions for example, placing one arm behind their back, working in silence, and shape stipulations. LRT facilitated post-activity discussion reviewing team processes and necessary communication skills involved in completion. Patients were encouraged to reflect how the skills utilized, or not utilized, in this activity can be incorporated to positively impact support systems post discharge.   Affect/Mood: Appropriate   Participation Level: Engaged   Participation Quality: Independent   Behavior: Appropriate   Speech/Thought Process: Focused   Insight: Good   Judgement: Good   Modes of Intervention: STEM Activity   Patient Response to Interventions:  Engaged   Education Outcome:  Acknowledges education   Clinical Observations/Individualized Feedback: Pt attended and participated in group session.     Plan: Continue to engage patient in RT group sessions 2-3x/week.   Froylan Hobby-McCall, LRT,CTRS 12/03/2022 12:55 PM

## 2022-12-03 NOTE — BH IP Treatment Plan (Signed)
Interdisciplinary Treatment and Diagnostic Plan Update  12/03/2022 Time of Session: 10:30am, update Sharon Bowman MRN: [id number]  Principal Diagnosis: MDD (major depressive disorder), recurrent severe, without psychosis (HCC)  Secondary Diagnoses: Principal Problem:   MDD (major depressive disorder), recurrent severe, without psychosis (HCC)   Current Medications:  Current Facility-Administered Medications  Medication Dose Route Frequency Provider Last Rate Last Admin   acetaminophen (TYLENOL) tablet 650 mg  650 mg Oral Q6H PRN Phineas Inches, MD   650 mg at 12/03/22 0929   alum & mag hydroxide-simeth (MAALOX/MYLANTA) 200-200-20 MG/5ML suspension 30 mL  30 mL Oral Q4H PRN Massengill, Harrold Donath, MD       ARIPiprazole (ABILIFY) tablet 2 mg  2 mg Oral Daily Abbott Pao, Nadir, MD   2 mg at 12/03/22 0802   ascorbic acid (VITAMIN C) tablet 500 mg  500 mg Oral Daily Massengill, Harrold Donath, MD   500 mg at 12/03/22 4098   aspirin EC tablet 81 mg  81 mg Oral Daily Massengill, Harrold Donath, MD   81 mg at 12/03/22 0801   aspirin-acetaminophen-caffeine (EXCEDRIN MIGRAINE) per tablet 1 tablet  1 tablet Oral Q6H PRN Massengill, Harrold Donath, MD       cephALEXin (KEFLEX) capsule 500 mg  500 mg Oral Q8H Massengill, Harrold Donath, MD   500 mg at 12/03/22 1405   diphenhydrAMINE (BENADRYL) capsule 50 mg  50 mg Oral TID PRN Phineas Inches, MD       Or   diphenhydrAMINE (BENADRYL) injection 50 mg  50 mg Intramuscular TID PRN Massengill, Harrold Donath, MD       DULoxetine (CYMBALTA) DR capsule 90 mg  90 mg Oral Daily Abbott Pao, Nadir, MD   90 mg at 12/03/22 0800   estradiol (CLIMARA - Dosed in mg/24 hr) patch 0.1 mg  0.1 mg Transdermal Weekly Abbott Pao, Nadir, MD   0.1 mg at 12/02/22 1252   ferrous sulfate tablet 325 mg  325 mg Oral Q breakfast Massengill, Harrold Donath, MD   325 mg at 12/03/22 0802   gabapentin (NEURONTIN) capsule 400 mg  400 mg Oral TID Sarita Bottom, MD   400 mg at 12/03/22 1202   haloperidol (HALDOL) tablet 5 mg  5 mg Oral TID  PRN Phineas Inches, MD       Or   haloperidol lactate (HALDOL) injection 5 mg  5 mg Intramuscular TID PRN Massengill, Harrold Donath, MD       hydrOXYzine (ATARAX) tablet 25 mg  25 mg Oral Q6H PRN Abbott Pao, Nadir, MD       LORazepam (ATIVAN) tablet 2 mg  2 mg Oral TID PRN Massengill, Harrold Donath, MD       Or   LORazepam (ATIVAN) injection 2 mg  2 mg Intramuscular TID PRN Massengill, Harrold Donath, MD       magnesium hydroxide (MILK OF MAGNESIA) suspension 30 mL  30 mL Oral Daily PRN Massengill, Nathan, MD       multivitamin with minerals tablet 1 tablet  1 tablet Oral Daily Massengill, Nathan, MD   1 tablet at 12/03/22 0801   neomycin-bacitracin-polymyxin (NEOSPORIN) ointment   Topical PRN Sarita Bottom, MD   Given at 12/03/22 0934   traZODone (DESYREL) tablet 100 mg  100 mg Oral QHS Attiah, Nadir, MD   100 mg at 12/02/22 2125   vitamin B-12 (CYANOCOBALAMIN) tablet 50 mcg  50 mcg Oral Daily Massengill, Harrold Donath, MD   50 mcg at 12/03/22 0801   PTA Medications: Medications Prior to Admission  Medication Sig Dispense Refill Last Dose   acetaminophen (TYLENOL) 500 MG  tablet Take 1,000 mg by mouth every 6 (six) hours as needed for moderate pain.      amitriptyline (ELAVIL) 100 MG tablet TAKE 1 TABLET BY MOUTH EVERYDAY AT BEDTIME (Patient taking differently: Take 100 mg by mouth at bedtime.) 90 tablet 1    aspirin EC 81 MG tablet Take 81 mg by mouth daily. Swallow whole.      aspirin-acetaminophen-caffeine (EXCEDRIN MIGRAINE) 250-250-65 MG per tablet Take 1 tablet by mouth every 6 (six) hours as needed for headache or migraine. \      Cyanocobalamin (B-12) 50 MCG TABS Take 50 mcg by mouth daily.       DULoxetine (CYMBALTA) 60 MG capsule Take 60 mg by mouth daily.      estradiol (VIVELLE-DOT) 0.1 MG/24HR patch Place 1 patch onto the skin 2 (two) times a week. Change on Wednesday and Sundays      ferrous sulfate 325 (65 FE) MG tablet Take 325 mg by mouth daily with breakfast.      gabapentin (NEURONTIN) 300 MG capsule  TAKE 1 CAPSULE BY MOUTH THREE TIMES A DAY (Patient taking differently: Take 300 mg by mouth 3 (three) times daily.) 270 capsule 1    hydrOXYzine (VISTARIL) 25 MG capsule Take 25-50 mg by mouth at bedtime.      Multiple Vitamin (MULTI-DAY VITAMINS PO) Take 1 tablet by mouth daily.      vitamin C (ASCORBIC ACID) 500 MG tablet Take 500 mg by mouth daily.       Patient Stressors: Financial difficulties   Other: breakup with x fiance    Patient Strengths: Automotive engineer for treatment/growth  Supportive family/friends   Treatment Modalities: Medication Management, Group therapy, Case management,  1 to 1 session with clinician, Psychoeducation, Recreational therapy.   Physician Treatment Plan for Primary Diagnosis: MDD (major depressive disorder), recurrent severe, without psychosis (HCC) Long Term Goal(s): Improvement in symptoms so as ready for discharge   Short Term Goals: Ability to identify changes in lifestyle to reduce recurrence of condition will improve Ability to verbalize feelings will improve Ability to disclose and discuss suicidal ideas Ability to demonstrate self-control will improve Ability to identify and develop effective coping behaviors will improve  Medication Management: Evaluate patient's response, side effects, and tolerance of medication regimen.  Therapeutic Interventions: 1 to 1 sessions, Unit Group sessions and Medication administration.  Evaluation of Outcomes: Progressing  Physician Treatment Plan for Secondary Diagnosis: Principal Problem:   MDD (major depressive disorder), recurrent severe, without psychosis (HCC)  Long Term Goal(s): Improvement in symptoms so as ready for discharge   Short Term Goals: Ability to identify changes in lifestyle to reduce recurrence of condition will improve Ability to verbalize feelings will improve Ability to disclose and discuss suicidal ideas Ability to demonstrate self-control will  improve Ability to identify and develop effective coping behaviors will improve     Medication Management: Evaluate patient's response, side effects, and tolerance of medication regimen.  Therapeutic Interventions: 1 to 1 sessions, Unit Group sessions and Medication administration.  Evaluation of Outcomes: Progressing   RN Treatment Plan for Primary Diagnosis: MDD (major depressive disorder), recurrent severe, without psychosis (HCC) Long Term Goal(s): Knowledge of disease and therapeutic regimen to maintain health will improve  Short Term Goals: Ability to remain free from injury will improve, Ability to verbalize frustration and anger appropriately will improve, Ability to demonstrate self-control, Ability to participate in decision making will improve, Ability to verbalize feelings will improve, Ability to disclose and  discuss suicidal ideas, Ability to identify and develop effective coping behaviors will improve, and Compliance with prescribed medications will improve  Medication Management: RN will administer medications as ordered by provider, will assess and evaluate patient's response and provide education to patient for prescribed medication. RN will report any adverse and/or side effects to prescribing provider.  Therapeutic Interventions: 1 on 1 counseling sessions, Psychoeducation, Medication administration, Evaluate responses to treatment, Monitor vital signs and CBGs as ordered, Perform/monitor CIWA, COWS, AIMS and Fall Risk screenings as ordered, Perform wound care treatments as ordered.  Evaluation of Outcomes: Progressing   LCSW Treatment Plan for Primary Diagnosis: MDD (major depressive disorder), recurrent severe, without psychosis (HCC) Long Term Goal(s): Safe transition to appropriate next level of care at discharge, Engage patient in therapeutic group addressing interpersonal concerns.  Short Term Goals: Engage patient in aftercare planning with referrals and resources,  Increase social support, Increase ability to appropriately verbalize feelings, Increase emotional regulation, Facilitate acceptance of mental health diagnosis and concerns, Facilitate patient progression through stages of change regarding substance use diagnoses and concerns, Identify triggers associated with mental health/substance abuse issues, and Increase skills for wellness and recovery  Therapeutic Interventions: Assess for all discharge needs, 1 to 1 time with Social worker, Explore available resources and support systems, Assess for adequacy in community support network, Educate family and significant other(s) on suicide prevention, Complete Psychosocial Assessment, Interpersonal group therapy.  Evaluation of Outcomes: Progressing   Progress in Treatment: Attending groups: Yes. Participating in groups: Yes. Taking medication as prescribed: Yes. Toleration medication: Yes. Family/Significant other contact made: No, will contact:  Jackey Loge mo. [phone]  Kristie Coffer sis. [phone] Patient understands diagnosis: Yes. Discussing patient identified problems/goals with staff: Yes. Medical problems stabilized or resolved: Yes. Denies suicidal/homicidal ideation: Yes. Issues/concerns per patient self-inventory: No.   New problem(s) identified: No, Describe:  none reported   New Short Term/Long Term Goal(s):   medication stabilization, elimination of SI thoughts, development of comprehensive mental wellness plan.    Patient Goals:  Pt states, "I want to start recovering from my past"   Discharge Plan or Barriers: Patient has past traumatic experiences from prior relationship. Patient recently admitted. CSW will continue to follow and assess for appropriate referrals and possible discharge planning.    Reason for Continuation of Hospitalization: Anxiety Depression Medication stabilization Suicidal ideation  Estimated Length of Stay: 5-7 days  Last 3 [place] Suicide  Severity Risk Score: Flowsheet Row Ad[facility] (Current) [facility] from 09/05/2021 in [facility]  C-SSRS RISK CATEGORY Low Risk No Risk       Last PHQ 2/9 Scores:    09/27/2021    9:34 AM 04/04/2020   11:07 AM 03/25/2019   11:13 AM  Depression screen PHQ 2/9  Decreased Interest 1 0 0  Down, Depressed, Hopeless 3 0 0  PHQ - 2 Score 4 0 0  Altered sleeping 0    Tired, decreased energy 3    Change in appetite 0    Feeling bad or failure about yourself  0    Trouble concentrating 3    Moving slowly or fidgety/restless 0    Suicidal thoughts 0    PHQ-9 Score 10    Difficult doing work/chores Very difficult      Scribe for Treatment Team: Beatris Si, LCSW 12/03/2022 3:42 PM

## 2022-12-03 NOTE — Progress Notes (Signed)
D: Patient is alert, oriented, pleasant, and cooperative. Denies SI, HI, AVH, and verbally contracts for safety.    A: Scheduled medications administered per MD order. PRN tylenol and neosporin administered. Support provided. Patient educated on safety on the unit and medications. Routine safety checks every 15 minutes. Patient stated understanding to tell nurse about any new physical symptoms. Patient understands to tell staff of any needs.     R: No adverse drug reactions noted. Patient verbally contracts for safety. Patient remains safe at this time and will continue to monitor.    12/03/22 0900  Psych Admission Type (Psych Patients Only)  Admission Status Voluntary  Psychosocial Assessment  Patient Complaints None  Eye Contact Fair  Facial Expression Animated  Affect Anxious  Speech Logical/coherent  Interaction Assertive  Motor Activity Slow  Appearance/Hygiene Unremarkable  Behavior Characteristics Cooperative  Mood Pleasant  Thought Process  Coherency WDL  Content WDL  Delusions None reported or observed  Perception WDL  Hallucination None reported or observed  Judgment Limited  Confusion None  Danger to Self  Current suicidal ideation? Denies  Agreement Not to Harm Self Yes  Description of Agreement verbal  Danger to Others  Danger to Others None reported or observed

## 2022-12-04 NOTE — Progress Notes (Signed)
   12/04/22 2300  Psych Admission Type (Psych Patients Only)  Admission Status Voluntary  Psychosocial Assessment  Patient Complaints None  Eye Contact Fair  Facial Expression Animated  Affect Appropriate to circumstance  Speech Logical/coherent  Interaction Assertive  Motor Activity Slow  Appearance/Hygiene Unremarkable  Behavior Characteristics Appropriate to situation  Mood Pleasant  Thought Process  Coherency WDL  Content WDL  Delusions None reported or observed  Perception WDL  Hallucination None reported or observed  Judgment Limited  Confusion None  Danger to Self  Current suicidal ideation? Denies  Agreement Not to Harm Self Yes  Description of Agreement Pt verbally agreed to contract for safety.  Danger to Others  Danger to Others None reported or observed

## 2022-12-04 NOTE — Plan of Care (Signed)
  Problem: Education: Goal: Verbalization of understanding the information provided will improve Outcome: Progressing   Problem: Coping: Goal: Ability to verbalize frustrations and anger appropriately will improve Outcome: Progressing   Problem: Health Behavior/Discharge Planning: Goal: Compliance with treatment plan for underlying cause of condition will improve Outcome: Progressing   Problem: Physical Regulation: Goal: Ability to maintain clinical measurements within normal limits will improve Outcome: Progressing   Problem: Safety: Goal: Periods of time without injury will increase Outcome: Progressing   Problem: Coping: Goal: Coping ability will improve Outcome: Progressing Goal: Will verbalize feelings Outcome: Progressing   Problem: Health Behavior/Discharge Planning: Goal: Ability to make decisions will improve Outcome: Progressing

## 2022-12-04 NOTE — Group Note (Signed)
Date:  12/04/2022 Time:  2:16 PM  Group Topic/Focus:  Emotional Education:   The focus of this group is to discuss what feelings/emotions are, and how they are experienced.    Participation Level:  Active  Participation Quality:  Appropriate  Affect:  Appropriate  Cognitive:  Appropriate  Insight: Appropriate  Engagement in Group:  Engaged  Modes of Intervention:  Discussion and Support  Additional Comments:    Memory Dance Neasia Fleeman 12/04/2022, 2:16 PM

## 2022-12-04 NOTE — [facility] Group Notes (Addendum)
Spiritual care group on grief and loss facilitated by Chaplain Dyanne Carrel, [facility]  Group Goal: Support / Education around grief and loss  Members engage in facilitated group support and psycho-social education.  Group Description:  Following introductions and group rules, group members engaged in facilitated group dialogue and support around topic of loss, with particular support around experiences of loss in their lives. Group Identified types of loss (relationships / self / things) and identified patterns, circumstances, and changes that precipitate losses. Reflected on thoughts / feelings around loss, normalized grief responses, and recognized variety in grief experience. Group encouraged individual reflection on safe space and on the coping skills that they are already utilizing.  Group drew on Adlerian / Rogerian and narrative framework  Patient Progress: Sharon Bowman attended group and actively engaged and participated in group conversation and activities.  Her comments demonstrated good insight and she was supportive of her peers.

## 2022-12-04 NOTE — Care Management Important Message (Signed)
Medicare IM printed and given to Social Work to give to the patient.  

## 2022-12-04 NOTE — Group Note (Signed)
Recreation Therapy Group Note   Group Topic:Animal Assisted Therapy   Group Date: 12/04/2022 Start Time: 0945 End Time: 1030 Facilitators: Dorrell Mitcheltree-McCall, LRT,CTRS Location: [address] Dayroom   Animal-Assisted Activity (AAA) Program Checklist/Progress Notes Patient Eligibility Criteria Checklist & Daily Group note for Rec Tx Intervention  AAA/T Program Assumption of Risk Form signed by Patient/ or Parent Legal Guardian Yes  Patient is free of allergies or severe asthma Yes  Patient reports no fear of animals Yes  Patient reports no history of cruelty to animals Yes  Patient understands his/her participation is voluntary Yes  Patient washes hands before animal contact Yes  Patient washes hands after animal contact Yes   Affect/Mood: Appropriate   Participation Level: Engaged   Participation Quality: Independent   Behavior: Appropriate   Speech/Thought Process: Focused    Clinical Observations/Individualized Feedback:  Patient attended session and interacted appropriately with therapy dog and peers. Patient asked appropriate questions about therapy dog and his training. Patient shared stories about their pets at home with group.    Plan: Continue to engage patient in RT group sessions 2-3x/week.   Sofya Moustafa-McCall, LRT,CTRS  12/04/2022 12:39 PM
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
## 2022-12-04 NOTE — Progress Notes (Signed)
   12/04/22 0856  Psych Admission Type (Psych Patients Only)  Admission Status Voluntary  Psychosocial Assessment  Patient Complaints None  Eye Contact Fair  Facial Expression Animated  Affect Appropriate to circumstance  Speech Logical/coherent  Interaction Assertive  Motor Activity Slow  Appearance/Hygiene Unremarkable  Behavior Characteristics Appropriate to situation;Cooperative  Mood Pleasant  Thought Process  Coherency WDL  Content WDL  Delusions None reported or observed  Perception WDL  Hallucination None reported or observed  Judgment Limited  Confusion None  Danger to Self  Current suicidal ideation? Denies  Agreement Not to Harm Self Yes  Description of Agreement verbal  Danger to Others  Danger to Others None reported or observed
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
## 2022-12-05 NOTE — Group Note (Signed)
Date:  12/05/2022 Time:  11:07 AM  Group Topic/Focus:  Goals Group:   The focus of this group is to help patients establish daily goals to achieve during treatment and discuss how the patient can incorporate goal setting into their daily lives to aide in recovery. Orientation:   The focus of this group is to educate the patient on the purpose and policies of crisis stabilization and provide a format to answer questions about their admission.  The group details unit policies and expectations of patients while admitted.    Participation Level:  Active  Participation Quality:  Appropriate  Affect:  Appropriate  Cognitive:  Appropriate  Insight: Appropriate  Engagement in Group:  Engaged  Modes of Intervention:  Discussion  Additional Comments:    Jaquita Rector 12/05/2022, 11:07 AM

## 2022-12-05 NOTE — Group Note (Unsigned)
Date:  12/05/2022 Time:  6:22 PM  Group Topic/Focus:  Wellness Toolbox:   The focus of this group is to discuss various aspects of wellness, balancing those aspects and exploring ways to increase the ability to experience wellness.  Patients will create a wellness toolbox for use upon discharge.     Participation Level:  {BHH PARTICIPATION LEVEL:22264}  Participation Quality:  {BHH PARTICIPATION QUALITY:22265}  Affect:  {BHH AFFECT:22266}  Cognitive:  {BHH COGNITIVE:22267}  Insight: {BHH Insight2:20797}  Engagement in Group:  {BHH ENGAGEMENT IN GROUP:22268}  Modes of Intervention:  {BHH MODES OF INTERVENTION:22269}  Additional Comments:  ***  Sharon Bowman Sharon Bowman Sharon Bowman 12/05/2022, 6:22 PM  

## 2022-12-05 NOTE — Progress Notes (Signed)
   12/05/22 2100  Psych Admission Type (Psych Patients Only)  Admission Status Voluntary  Psychosocial Assessment  Patient Complaints None  Eye Contact Fair  Facial Expression Animated  Affect Appropriate to circumstance  Speech Logical/coherent  Interaction Assertive  Motor Activity Slow  Appearance/Hygiene Unremarkable  Behavior Characteristics Appropriate to situation  Mood Pleasant  Thought Process  Coherency WDL  Content WDL  Delusions None reported or observed  Perception WDL  Hallucination None reported or observed  Judgment Impaired  Confusion None  Danger to Self  Current suicidal ideation? Denies  Agreement Not to Harm Self Yes  Description of Agreement verbal  Danger to Others  Danger to Others None reported or observed   Alert/oriented. Makes needs/concerns known to staff. Pleasant cooperative with staff. Denies SI/HI/A/V hallucinations. Patient states went to group.  Will encourage  compliance and progression towards goals. Verbally contracted for safety. Will continue to monitor.

## 2022-12-05 NOTE — Progress Notes (Signed)
   12/05/22 0710  15 Minute Checks  Location [facility]  Visual Appearance Calm  Behavior Composed  Sleep (Behavioral Health Patients Only)  Calculate sleep? (Click Yes once per 24 hr at 0600 safety check) Yes  Documented sleep last 24 hours 7

## 2022-12-05 NOTE — Plan of Care (Signed)
  Problem: Safety: Goal: Periods of time without injury will increase Outcome: Progressing   

## 2022-12-05 NOTE — Group Note (Signed)
Date:  12/05/2022 Time:  6:15 PM  Group Topic/Focus:  Wellness Toolbox:   The focus of this group is to discuss various aspects of wellness, balancing those aspects and exploring ways to increase the ability to experience wellness.  Patients will create a wellness toolbox for use upon discharge.    Participation Level:  Active  Participation Quality:  Appropriate  Affect:  Appropriate  Cognitive:  Appropriate  Insight: Appropriate  Engagement in Group:  Engaged  Modes of Intervention:  Discussion  Additional Comments:    Jazzie Trampe Lashawn Addalyn Speedy 12/05/2022, 6:15 PM  

## 2022-12-05 NOTE — Progress Notes (Signed)
Sharon Bowman went inside [address] room to get bags belongs alone with Public relations account executive explain under no circumstances patients are not allow to go into each other room. RN nitify

## 2022-12-05 NOTE — [facility] Group Notes (Signed)
[facility] Group Notes:  (Nursing/MHT/Case Management/Adjunct)  Date:  12/05/2022  Time:  8:10 PM  Type of Therapy:   NA Group  Participation Level:  Active  Participation Quality:  Appropriate  Affect:  Appropriate  Cognitive:  Appropriate  Insight:  Appropriate  Engagement in Group:  Engaged  Modes of Intervention:  Education  Summary of Progress/Problems: Attended NA meeting.  Sharon Bowman 12/05/2022, 8:10 PM

## 2022-12-05 NOTE — Group Note (Signed)
Recreation Therapy Group Note   Group Topic:Problem Solving  Group Date: 12/05/2022 Start Time: 0932 End Time: 1012 Facilitators: Mayjor Ager-McCall, LRT,CTRS Location: [address] Dayroom   Goal Area(s) Addresses:  Patient will effectively work with peer towards shared goal.  Patient will identify skills used to make activity successful.  Patient will share challenges and verbalize solution-driven approaches used. Patient will identify how skills used during activity can be used to reach post d/c goals.   Group Description:  [employer]. Patients were provided the following materials: 4 drinking straws, 5 rubber bands, 5 paper clips, 2 index cards and 2 drinking cups. Using the provided materials patients were asked to build a launching mechanism to launch a ping pong ball across the room, approximately 10 feet. Patients were divided into teams of 3-5. Instructions required all materials be incorporated into the device, functionality of items left to the peer group's discretion.   Affect/Mood: Appropriate   Participation Level: Engaged   Participation Quality: Independent   Behavior: Appropriate   Speech/Thought Process: Focused   Insight: Good   Judgement: Good   Modes of Intervention: STEM Activity   Patient Response to Interventions:  Engaged   Education Outcome:  Acknowledges education   Clinical Observations/Individualized Feedback: Pt was attentive and engaged in trying to complete the activity.     Plan: Continue to engage patient in RT group sessions 2-3x/week.   Maysel Mccolm-McCall, LRT,CTRS 12/05/2022 12:45 PM

## 2022-12-05 NOTE — [facility] Group Notes (Signed)
[facility] Group Notes:  (Nursing/MHT/Case Management/Adjunct)  Date:  12/05/2022  Time:  2:04 AM  Type of Therapy:   Wrap-up group  Participation Level:  Active  Participation Quality:  Appropriate  Affect:  Appropriate  Cognitive:  Appropriate  Insight:  Appropriate  Engagement in Group:  Engaged  Modes of Intervention:  Education  Summary of Progress/Problems: Pt goal to find old happier self. Day 6/10.  Sharon Bowman 12/05/2022, 2:04 AM

## 2022-12-06 NOTE — Progress Notes (Signed)
Pt denied SI/HI/AVH this morning. Pt rated her depression a 5/10 and her anxiety a 10/10. Pt has been pleasant, calm, and cooperative throughout the shift. RN provided support and encouragement to patient. Pt given scheduled medications as prescribed. Q15 min checks verified for safety. Patient verbally contracts for safety. Patient compliant with medications and treatment plan. Patient is interacting well on the unit. Pt is safe on the unit.   12/06/22 0900  Psych Admission Type (Psych Patients Only)  Admission Status Voluntary  Psychosocial Assessment  Patient Complaints Anxiety;Depression  Eye Contact Fair  Facial Expression Animated  Affect Appropriate to circumstance  Speech Logical/coherent  Interaction Assertive  Motor Activity Slow  Appearance/Hygiene Unremarkable  Behavior Characteristics Appropriate to situation  Mood Pleasant  Thought Process  Coherency WDL  Content WDL  Delusions None reported or observed  Perception WDL  Hallucination None reported or observed  Judgment Impaired  Confusion None  Danger to Self  Current suicidal ideation? Denies  Self-Injurious Behavior No self-injurious ideation or behavior indicators observed or expressed   Agreement Not to Harm Self Yes  Description of Agreement Pt verbally contracts for safety  Danger to Others  Danger to Others None reported or observed

## 2022-12-06 NOTE — TOC Progression Note (Signed)
Transition of Care [facility]) - Progression Note    Patient Details  Name: Sharon Bowman MRN: [id number] Date of Birth: 1976/05/02  [facility]) CM/SW Contact  Johnell Comings Phone Number: [phone] 12/06/2022, 4:53 PM   [facility] Appeal Detailed Notice of Discharge letter created and saved: Yes Lafonda Mosses completed.) Detailed Notice of Discharge Document Given to Pateint: Yes Jacob Moores, LCSWA delivered to patient.) [employer] Document Created: Yes [employer] appeal documents uploaded to [employer] stite: Yes (Confirmation [id number])    Case ID: [id number]

## 2022-12-06 NOTE — [facility] Group Notes (Signed)
Spiritual care group facilitated by Chaplain Dyanne Carrel, [facility]  Group focused on topic of strength. Group members reflected on what thoughts and feelings emerge when they hear this topic. They then engaged in facilitated dialog around how strength is present in their lives. This dialog focused on representing what strength had been to them in their lives (images and patterns given) and what they saw as helpful in their life now (what they needed / wanted).  Activity drew on narrative framework.  Patient Progress: Sharon Bowman attended group and engaged and participated in group conversation.  She left partway through to meet with provider. Her comments were limited, but demonstrated good insight into the topic.

## 2022-12-06 NOTE — [facility] Group Notes (Signed)
[facility] Group Notes:  (Nursing/MHT/Case Management/Adjunct)  Date:  12/06/2022  Time:  8:49 PM  Type of Therapy:   Wrap-up group  Participation Level:  Active  Participation Quality:  Appropriate  Affect:  Appropriate  Cognitive:  Appropriate  Insight:  Appropriate  Engagement in Group:  Engaged  Modes of Intervention:  Education  Summary of Progress/Problems: Pt goal to focus on self care. Day 7/10.  Noah Delaine 12/06/2022, 8:49 PM

## 2022-12-06 NOTE — Progress Notes (Signed)
[facility] LCSW Progress Note   12/06/2022    9:51 AM  Landry Dyke Angello      Type of Note: Medicare Notice Given   Patient informed of right to appeal discharge, provided phone number to [facility]. Patient expressed no interest in appealing discharge at this time. CSW will continue to monitor situation.     Signed:   Jacob Moores, MSW, [facility] 12/06/2022 9:51 AM

## 2022-12-06 NOTE — Progress Notes (Signed)
   12/06/22 2010  Psych Admission Type (Psych Patients Only)  Admission Status Voluntary  Psychosocial Assessment  Patient Complaints Anxiety  Eye Contact Fair  Facial Expression Animated  Affect Appropriate to circumstance  Speech Logical/coherent  Interaction Assertive  Motor Activity Slow  Appearance/Hygiene Unremarkable  Behavior Characteristics Appropriate to situation  Mood Pleasant  Thought Process  Coherency WDL  Content WDL  Delusions None reported or observed  Perception WDL  Hallucination None reported or observed  Judgment Impaired  Confusion None  Danger to Self  Current suicidal ideation? Denies  Agreement Not to Harm Self Yes  Description of Agreement verbal  Danger to Others  Danger to Others None reported or observed   Alert/oriented. Makes needs/concerns known to staff. Pleasant cooperative with staff. Denies SI/HI/A/V hallucinations. Patient states went to group. Will encourage compliance and progression towards goals. Verbally contracted for safety. Will continue to monitor.
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
## 2022-12-06 NOTE — Plan of Care (Signed)
  Problem: Safety: Goal: Periods of time without injury will increase Outcome: Progressing   

## 2022-12-07 MED ORDER — ARIPIPRAZOLE 2 MG PO TABS: 2.0000 mg | ORAL_TABLET | Freq: Every day | ORAL | 0 refills | Status: DC

## 2022-12-07 MED ORDER — DULOXETINE HCL 30 MG PO CPEP: 90.0000 mg | ORAL_CAPSULE | Freq: Every day | ORAL | 0 refills | Status: DC

## 2022-12-07 MED ORDER — GABAPENTIN 400 MG PO CAPS: 400.0000 mg | ORAL_CAPSULE | Freq: Three times a day (TID) | ORAL | 0 refills | Status: DC

## 2022-12-07 MED ORDER — TRAZODONE HCL 100 MG PO TABS: 100.0000 mg | ORAL_TABLET | Freq: Every day | ORAL | 0 refills | Status: DC

## 2022-12-07 NOTE — Progress Notes (Signed)
Pt did attend AA group and actively participated.
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
## 2022-12-07 NOTE — Group Note (Signed)
Date:  12/07/2022 Time:  6:48 PM  Group Topic/Focus:  Support and Check-in- Open Discussion/Journaling    Participation Level:  Active  Participation Quality:  Appropriate  Affect:  Appropriate  Cognitive:  Appropriate  Insight: Appropriate  Engagement in Group:  Engaged  Modes of Intervention:  Activity, Discussion, and Support  Additional Comments:   Pt  attended and actively participated in the support check in, journaling and open discussion group.  [name] 12/07/2022, 6:48 PM

## 2022-12-07 NOTE — Group Note (Signed)
Recreation Therapy Group Note   Group Topic:Other  Group Date: 12/07/2022 Start Time: 1300 End Time: 1400 Facilitators: Sonnie Bias-McCall, LRT,CTRS Location: [address] Dayroom   Activity Description/Intervention: Therapeutic Drumming. Patients with peers and staff were given the opportunity to engage in a leader facilitated [facility] Group Empowerment Drumming Circle with staff from the [employer], in partnership with [employer]. Teaching laboratory technician and trained [employer], Theodoro Doing leading with LRT observing and documenting intervention and pt response. This evidenced-based practice targets 7 areas of health and wellbeing in the human experience including: stress-reduction, exercise, self-expression, camaraderie/support, nurturing, spirituality, and music-making (leisure).   Goal Area(s) Addresses:  Patient will engage in pro-social way in music group.  Patient will follow directions of drum leader on the first prompt. Patient will demonstrate no behavioral issues during group.  Patient will identify if a reduction in stress level occurs as a result of participation in therapeutic drum circle.    Affect/Mood: Appropriate   Participation Level: Engaged   Participation Quality: Independent   Behavior: Appropriate   Speech/Thought Process: Focused   Insight: Good   Judgement: Good   Modes of Intervention: Teaching laboratory technician   Patient Response to Interventions:  Engaged   Education Outcome:  Acknowledges education   Clinical Observations/Individualized Feedback: Joneisha actively engaged in therapeutic drumming exercise and discussions. Pt was appropriate with peers, staff, and musical equipment for duration of programming.  Pt identified "calm" as their feeling after participation in music-based programming. Pt affect congruent/incongruent with verbalized emotion.   Plan: Continue to engage patient in RT group sessions  2-3x/week.   Breea Loncar-McCall, LRT,CTRS 12/07/2022 2:34 PM
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
## 2022-12-07 NOTE — Progress Notes (Addendum)
Pt denied SI/HI/AVH this morning. Pt rated her depression a 7/10 and her anxiety a 10/10. PRN Hydroxyzine administered per patient request for anxiety. Patient reports that she did not sleep well last night. Pt has been pleasant, calm, and cooperative throughout the shift. RN provided support and encouragement to patient. Pt given scheduled medications as prescribed. Q15 min checks verified for safety. Patient verbally contracts for safety. Patient compliant with medications and treatment plan. Patient is interacting well on the unit. Pt is safe on the unit.   12/07/22 0851  Psych Admission Type (Psych Patients Only)  Admission Status Voluntary  Psychosocial Assessment  Patient Complaints Anxiety;Depression  Eye Contact Fair  Facial Expression Animated  Affect Appropriate to circumstance  Speech Logical/coherent  Interaction Assertive  Motor Activity Slow  Appearance/Hygiene Unremarkable  Behavior Characteristics Appropriate to situation  Mood Pleasant  Thought Process  Coherency WDL  Content WDL  Delusions None reported or observed  Perception WDL  Hallucination None reported or observed  Judgment Impaired  Confusion None  Danger to Self  Current suicidal ideation? Denies  Self-Injurious Behavior No self-injurious ideation or behavior indicators observed or expressed   Agreement Not to Harm Self Yes  Description of Agreement Pt verbally contracts for safety  Danger to Others  Danger to Others None reported or observed

## 2022-12-07 NOTE — [facility] Counselor (Signed)
[facility] LCSW Progress Note   12/07/2022    3:46 PM  Sharon Bowman      Type of Note: [facility] Notice    CSW provided patient with a copy of her noticed and explained that she would need to DC tomorrow before noon because they denied appeal for her to stay longer. Patient said she understood and will contact her mom. CSW also made the doctor aware via secure chat.     Signed:   Jacob Moores, MSW, [facility] 12/07/2022 3:46 PM
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
## 2022-12-07 NOTE — Plan of Care (Signed)
  Problem: Education: Goal: Knowledge of [facility] Education information/materials will improve Outcome: Progressing   Problem: Education: Goal: Emotional status will improve Outcome: Progressing   Problem: Education: Goal: Knowledge of the prescribed therapeutic regimen will improve Outcome: Progressing

## 2022-12-07 NOTE — Progress Notes (Signed)
   12/07/22 2100  Psych Admission Type (Psych Patients Only)  Admission Status Voluntary  Psychosocial Assessment  Patient Complaints Anxiety;Depression  Eye Contact Fair  Facial Expression Animated  Affect Appropriate to circumstance  Speech Logical/coherent  Interaction Assertive  Motor Activity Slow  Appearance/Hygiene Unremarkable  Behavior Characteristics Appropriate to situation  Mood Pleasant  Thought Process  Coherency WDL  Content WDL  Delusions None reported or observed  Perception WDL  Hallucination None reported or observed  Judgment Impaired  Confusion None  Danger to Self  Current suicidal ideation? Denies  Self-Injurious Behavior No self-injurious ideation or behavior indicators observed or expressed   Agreement Not to Harm Self Yes  Description of Agreement Verbal contract  Danger to Others  Danger to Others None reported or observed

## 2022-12-07 NOTE — Group Note (Signed)
Date:  12/07/2022 Time:  4:22 PM  Group Topic/Focus:  Goals Group:   The focus of this group is to help patients establish daily goals to achieve during treatment and discuss how the patient can incorporate goal setting into their daily lives to aide in recovery. Orientation:   The focus of this group is to educate the patient on the purpose and policies of crisis stabilization and provide a format to answer questions about their admission.  The group details unit policies and expectations of patients while admitted.    Participation Level:  Active  Participation Quality:  Appropriate  Affect:  Appropriate  Cognitive:  Appropriate  Insight: Appropriate  Engagement in Group:  Engaged  Modes of Intervention:  Activity, Discussion, and Orientation  Additional Comments:   Pt attended and participated in the Orientation/Goals group.  Edmund Hilda Aleyah Balik 12/07/2022, 4:22 PM

## 2022-12-08 NOTE — Progress Notes (Signed)
Pt discharged to lobby. Pt was stable and appreciative at that time. All papers and electronic prescriptions were given and valuables returned. Suicide safety plan completed and copy given to patient Verbal understanding expressed. Denies SI/HI and A/VH. Pt given opportunity to express concerns and ask questions.

## 2022-12-08 NOTE — Progress Notes (Signed)
  [facility] Adult Case Management Discharge Plan :  Will you be returning to the same living situation after discharge:  Yes,  Patient to return home with her mother At discharge, do you have transportation home?: Yes,  patient's mother to provide transport home Do you have the ability to pay for your medications: Yes,  Patient has insurance to cover medication costs  Release of information consent forms completed and in the chart;  Patient's signature needed at discharge.  Patient to Follow up at:  Follow-up Information     [facility]. Go on 12/20/2022.   Specialty: Behavioral Health Why: You have an appointment for medication management services on 12/20/22 at 10:45 am, in person.   You have an appointment for therapy services on 6/25 at 10:45 with Bynum Bellows.   * Please give 24 hour notice if you cannot attend your appts as there is a $50 no show fee. Contact information: [address] [place] [address] [phone]        [facility]GRAM Follow up.   Specialty: Behavioral Health Why: Referral made Contact information: [address] [place] [address] [phone]                Next level of care provider has access to [facility] Link:yes  Safety Planning and Suicide Prevention discussed: Yes,  Completed with patient's mother     Has patient been referred to the Quitline?: Patient refused referral for treatment  Patient has been referred for addiction treatment: Patient refused referral for treatment.  [address] [place], [place] 12/08/2022, 9:45 AM

## 2022-12-08 NOTE — Progress Notes (Signed)
   12/08/22 0626  15 Minute Checks  Location Dayroom  Visual Appearance Calm  Behavior Composed  Sleep (Behavioral Health Patients Only)  Calculate sleep? (Click Yes once per 24 hr at 0600 safety check) Yes  Documented sleep last 24 hours 7.5

## 2022-12-08 NOTE — Progress Notes (Signed)
   12/08/22 0800  Psych Admission Type (Psych Patients Only)  Admission Status Voluntary  Psychosocial Assessment  Patient Complaints Sleep disturbance  Eye Contact Fair  Facial Expression Animated  Affect Appropriate to circumstance  Speech Logical/coherent  Interaction Assertive  Motor Activity Slow  Appearance/Hygiene Unremarkable  Behavior Characteristics Cooperative;Appropriate to situation  Mood Pleasant  Thought Process  Coherency WDL  Content WDL  Delusions None reported or observed  Perception WDL  Hallucination None reported or observed  Judgment Impaired  Confusion None  Danger to Self  Current suicidal ideation? Denies  Self-Injurious Behavior No self-injurious ideation or behavior indicators observed or expressed   Agreement Not to Harm Self Yes  Description of Agreement verbal contract  Danger to Others  Danger to Others None reported or observed

## 2022-12-08 NOTE — [facility] Suicide Risk Assessment (Signed)
[facility] Discharge Suicide Risk Assessment   Principal Problem: MDD (major depressive disorder), recurrent severe, without psychosis (HCC) Discharge Diagnoses: Principal Problem:   MDD (major depressive disorder), recurrent severe, without psychosis (HCC)   Total Time spent with patient: 30 minutes  Musculoskeletal: Strength & Muscle Tone: within normal limits Gait & Station: normal Patient leans: N/A  Psychiatric Specialty Exam  Presentation  General Appearance:  Casual  Eye Contact: Fair  Speech: Clear and Coherent  Speech Volume: Decreased  Handedness: Right   Mood and Affect  Mood: Anxious  Duration of Depression Symptoms: No data recorded Affect: Appropriate   Thought Process  Thought Processes: Coherent  Descriptions of Associations:Intact  Orientation:Full (Time, Place and Person)  Thought Content:Logical  History of Schizophrenia/Schizoaffective disorder:No data recorded Duration of Psychotic Symptoms:No data recorded Hallucinations:Hallucinations: None  Ideas of Reference:None  Suicidal Thoughts:Suicidal Thoughts: No  Homicidal Thoughts:Homicidal Thoughts: No   Sensorium  Memory: Immediate Fair; Remote Fair; Recent Fair  Judgment: Fair  Insight: Good   Executive Functions  Concentration: Good  Attention Span: Good  Recall: Good  Fund of Knowledge: Good  Language: Good   Psychomotor Activity  Psychomotor Activity: Psychomotor Activity: Normal   Assets  Assets: Communication Skills; Desire for Improvement; Housing   Sleep  Sleep: Sleep: Good   Physical Exam: Physical Exam Constitutional:      Appearance: Normal appearance.  Neurological:     Mental Status: She is alert.  Psychiatric:        Mood and Affect: Mood normal.        Behavior: Behavior normal.        Thought Content: Thought content normal.        Judgment: Judgment normal.    ROS Blood pressure 131/83, pulse 98, temperature 98.5 F  (36.9 C), temperature source Oral, resp. rate 16, height 5\' 1"  (1.549 m), weight 63.5 kg, last menstrual period 11/01/2014, SpO2 99 %. Body mass index is 26.45 kg/m.  Mental Status Per Nursing Assessment::   On Admission:  Suicidal ideation indicated by patient  Demographic Factors:  Divorced or widowed, Caucasian, and Living alone  Loss Factors: Decrease in vocational status and Legal issues  Historical Factors: Impulsivity  Risk Reduction Factors:   Living with another person, especially a relative and Positive social support  Continued Clinical Symptoms:  Depression:   Impulsivity  Cognitive Features That Contribute To Risk:  Thought constriction (tunnel vision)    Suicide Risk:  Mild:  Suicidal ideation of limited frequency, intensity, duration, and specificity.  There are no identifiable plans, no associated intent, mild dysphoria and related symptoms, good self-control (both objective and subjective assessment), few other risk factors, and identifiable protective factors, including available and accessible social support.   Follow-up Information     [facility]. Go on 12/20/2022.   Specialty: Behavioral Health Why: You have an appointment for medication management services on 12/20/22 at 10:45 am, in person.   You have an appointment for therapy services on 6/25 at 10:45 with Bynum Bellows.   * Please give 24 hour notice if you cannot attend your appts as there is a $50 no show fee. Contact information: [address] [place] [address] [phone]        BEHAVIORAL HEALTH PARTIAL HOSPITALIZATION PROGRAM Follow up.   Specialty: Behavioral Health Why: Referral made Contact information: [address] [place] [address] [place] [address] [phone]  Plan Of Care/Follow-up recommendations:  Activity:  As Tolerated  Rex Kras, MD 12/08/2022, 9:59  AM

## 2022-12-10 ENCOUNTER — Telehealth (HOSPITAL_COMMUNITY): Payer: Self-pay | Admitting: Professional

## 2022-12-11 ENCOUNTER — Telehealth (HOSPITAL_COMMUNITY): Payer: Self-pay | Admitting: Licensed Clinical Social Worker

## 2022-12-11 ENCOUNTER — Telehealth: Payer: Self-pay | Admitting: Family

## 2022-12-11 ENCOUNTER — Other Ambulatory Visit (HOSPITAL_COMMUNITY): Payer: Medicare HMO | Attending: Psychiatry | Admitting: Professional

## 2022-12-11 DIAGNOSIS — F332 Major depressive disorder, recurrent severe without psychotic features: Secondary | ICD-10-CM

## 2022-12-11 NOTE — Telephone Encounter (Signed)
Patient would like to be scheduled for a hospital f/u visit and to get a place on her leg looked at. She has been booked heavily until July 7,2024,with OT so we are having a hard time getting her scheduled,due to a hard stop not allowing [place] to because of the many appointments that she already has. Is there anyway around her many OT appointments where we can get he scheduled to see Tabitha? Please advise.

## 2022-12-12 ENCOUNTER — Other Ambulatory Visit (HOSPITAL_COMMUNITY): Payer: Medicare HMO

## 2022-12-12 NOTE — Telephone Encounter (Signed)
I am not sure how to answer this, if she is not available then I would say we have to schedule her on her schedule.

## 2022-12-13 ENCOUNTER — Other Ambulatory Visit (HOSPITAL_COMMUNITY): Payer: Medicare HMO | Admitting: Licensed Clinical Social Worker

## 2022-12-13 ENCOUNTER — Other Ambulatory Visit (HOSPITAL_COMMUNITY): Payer: Medicare HMO

## 2022-12-13 DIAGNOSIS — F411 Generalized anxiety disorder: Secondary | ICD-10-CM | POA: Diagnosis not present

## 2022-12-13 DIAGNOSIS — R4589 Other symptoms and signs involving emotional state: Secondary | ICD-10-CM | POA: Diagnosis not present

## 2022-12-13 DIAGNOSIS — Z79899 Other long term (current) drug therapy: Secondary | ICD-10-CM | POA: Diagnosis not present

## 2022-12-13 DIAGNOSIS — F332 Major depressive disorder, recurrent severe without psychotic features: Secondary | ICD-10-CM | POA: Diagnosis not present

## 2022-12-13 DIAGNOSIS — Z87891 Personal history of nicotine dependence: Secondary | ICD-10-CM | POA: Diagnosis not present

## 2022-12-13 DIAGNOSIS — G4700 Insomnia, unspecified: Secondary | ICD-10-CM | POA: Diagnosis not present

## 2022-12-13 MED ORDER — PROPRANOLOL HCL 10 MG PO TABS
10.0000 mg | ORAL_TABLET | Freq: Every day | ORAL | 0 refills | Status: DC
Start: 2022-12-13 — End: 2022-12-31
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
## 2022-12-13 NOTE — Psych (Signed)
Active     OP Depression     LTG: Reduce frequency, intensity, and duration of depression symptoms so that daily functioning is improved (Initial)     Start:  12/13/22    Expected End:  03/15/23         LTG: Increase coping skills to manage depression and improve ability to perform daily activities (Initial)     Start:  12/13/22    Expected End:  03/15/23         STG: Sharon Bowman will attend at least 80% of scheduled PHP sessions    (Initial)     Start:  12/13/22    Expected End:  03/15/23         STG: Sharon Bowman will complete at least 80% of assigned homework  (Initial)     Start:  12/13/22    Expected End:  03/15/23         STG: Sharon Bowman will identify cognitive patterns and beliefs that support depression (Initial)     Start:  12/13/22    Expected End:  03/15/23           Sharon Bowman verbally agrees to treatment plan.

## 2022-12-14 ENCOUNTER — Other Ambulatory Visit (HOSPITAL_COMMUNITY): Payer: Medicare HMO

## 2022-12-14 ENCOUNTER — Other Ambulatory Visit (HOSPITAL_COMMUNITY): Payer: Medicare HMO | Admitting: Licensed Clinical Social Worker

## 2022-12-14 DIAGNOSIS — G4700 Insomnia, unspecified: Secondary | ICD-10-CM | POA: Diagnosis not present

## 2022-12-14 DIAGNOSIS — F332 Major depressive disorder, recurrent severe without psychotic features: Secondary | ICD-10-CM

## 2022-12-14 DIAGNOSIS — F411 Generalized anxiety disorder: Secondary | ICD-10-CM | POA: Diagnosis not present

## 2022-12-14 DIAGNOSIS — Z79899 Other long term (current) drug therapy: Secondary | ICD-10-CM | POA: Diagnosis not present

## 2022-12-14 DIAGNOSIS — Z87891 Personal history of nicotine dependence: Secondary | ICD-10-CM | POA: Diagnosis not present

## 2022-12-14 DIAGNOSIS — R4589 Other symptoms and signs involving emotional state: Secondary | ICD-10-CM | POA: Diagnosis not present

## 2022-12-17 ENCOUNTER — Other Ambulatory Visit (HOSPITAL_COMMUNITY): Payer: Medicare HMO | Admitting: Licensed Clinical Social Worker

## 2022-12-17 ENCOUNTER — Encounter (HOSPITAL_COMMUNITY): Payer: Self-pay

## 2022-12-17 ENCOUNTER — Other Ambulatory Visit (HOSPITAL_COMMUNITY): Payer: Medicare HMO

## 2022-12-17 DIAGNOSIS — Z87891 Personal history of nicotine dependence: Secondary | ICD-10-CM | POA: Diagnosis not present

## 2022-12-17 DIAGNOSIS — F332 Major depressive disorder, recurrent severe without psychotic features: Secondary | ICD-10-CM

## 2022-12-17 DIAGNOSIS — G4700 Insomnia, unspecified: Secondary | ICD-10-CM | POA: Diagnosis not present

## 2022-12-17 DIAGNOSIS — R4589 Other symptoms and signs involving emotional state: Secondary | ICD-10-CM

## 2022-12-17 DIAGNOSIS — Z79899 Other long term (current) drug therapy: Secondary | ICD-10-CM | POA: Diagnosis not present

## 2022-12-17 DIAGNOSIS — F411 Generalized anxiety disorder: Secondary | ICD-10-CM | POA: Diagnosis not present
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
## 2022-12-17 NOTE — Therapy (Signed)
[facility] PARTIAL HOSPITALIZATION PROGRAM [address] [place], [place], [address] Phone: [phone]   Fax:  [phone]  Occupational Therapy Evaluation Virtual Visit via Video Note  I connected with Sharon Bowman on 12/17/22 at  8:00 AM EDT by a video enabled telemedicine application and verified that I am speaking with the correct person using two identifiers.  Location: Patient: home Provider: office   I discussed the limitations of evaluation and management by telemedicine and the availability of in person appointments. The patient expressed understanding and agreed to proceed.    The patient was advised to call back or seek an in-person evaluation if the symptoms worsen or if the condition fails to improve as anticipated.  I provided 85 minutes of non-face-to-face time during this encounter.   Patient Details  Name: Sharon Bowman MRN: [id number] Date of Birth: [birth year] No data recorded  Encounter Date: 12/14/2022   OT End of Session - 12/17/22 1823     Visit Number 1    Number of Visits 20    Date for OT Re-Evaluation 01/06/23    OT Start Time 0930   Eval: 30; Tx: 55   OT Stop Time 1255    OT Time Calculation (min) 205 min    Behavior During Therapy WFL for tasks assessed/performed             Past Medical History:  Diagnosis Date   Allergic asthma 08/2015   Anemia    Anxiety    Arthritis    osetoarthritis   Chronic sinusitis    Depression    DVT (deep venous thrombosis) (HCC) 04/2014   right leg - behind calf, no treated with aspirin daily   DVT (deep venous thrombosis) (HCC)    Pt states hx DVT in R knee and R upper thigh in 2015   Endometriosis    Endometriosis    Family history of premature CAD    Fibromyalgia 2017   Former smoker    GAD (generalized anxiety disorder)    Headache    History of PFTs 08/2015   normal   Interstitial cystitis    Interstitial cystitis    PCOS (polycystic ovarian syndrome)     Polycystic kidney disease     Past Surgical History:  Procedure Laterality Date   ABDOMINAL HYSTERECTOMY     BLADDER SURGERY     BLADDER STRETCH X 3   carpel radial tunnel  2014   carpel tunnel     left  and right hand    CYSTO WITH HYDRODISTENSION N/A 11/11/2014   Procedure: CYSTOSCOPY/HYDRODISTENSION MARCAINE AND PYRIDIUM AND Doreatha Lew;  Surgeon: Jethro Bolus, MD;  Location: [facility];  Service: Urology;  Laterality: N/A;   CYSTOSCOPY N/A 11/11/2014   Procedure: CYSTOSCOPY;  Surgeon: Noland Fordyce, MD;  Location: [facility];  Service: Gynecology;  Laterality: N/A;   CYSTOSCOPY WITH RETROGRADE PYELOGRAM, URETEROSCOPY AND STENT PLACEMENT Bilateral 11/11/2014   Procedure:  RETROGRADE PYELOGRAM with BILATERAL URETERAL CATHETHERS;  Surgeon: Jethro Bolus, MD;  Location: [facility];  Service: Urology;  Laterality: Bilateral;   IUD REMOVAL N/A 11/11/2014   Procedure: INTRAUTERINE DEVICE (IUD) REMOVAL;  Surgeon: Noland Fordyce, MD;  Location: [facility];  Service: Gynecology;  Laterality: N/A;   LAPAROSCOPY     X 2 ENDOMETRIOSIS.   LEG SURGERY  04/2014   right knee - tumor - benighn   ROBOTIC ASSISTED LAPAROSCOPIC LYSIS OF ADHESION N/A 11/11/2014   Procedure: ROBOTIC ASSISTED LAPAROSCOPIC LYSIS OF ADHESION;  Surgeon: Noland Fordyce, MD;  Location: [facility];  Service: Gynecology;  Laterality: N/A;   ROBOTIC ASSISTED TOTAL HYSTERECTOMY WITH BILATERAL SALPINGO OOPHERECTOMY Bilateral 11/11/2014   Procedure: ROBOTIC ASSISTED TOTAL HYSTERECTOMY WITH BILATERAL SALPINGO OOPHORECTOMY;  Surgeon: Noland Fordyce, MD;  Location: [facility];  Service: Gynecology;  Laterality: Bilateral;   TOOTH EXTRACTION      There were no vitals filed for this visit.   Subjective Assessment - 12/17/22 1819     Subjective  I'm hoping to learn new coping skills while I am in this PHP.    Pertinent History MDD, anxiety    Patient Stated Goals improve goals aherence, motivation, routines, communication, coping skills.     Currently in Pain? No/denies    Pain Score 0-No pain    Multiple Pain Sites No                 OT Assessment  Diagnosis: MDD Past medical history/referral information: depression / anxiety Living situation: parents ADLs: independent Work: NA Leisure: inhibited Social support:  fair Struggles: coping / communication / routines / goals and habits OT goal: Pt will improve inhibited psychosocial domains in order to allow for improved social participation and improved occupational performance.   OCAIRS Mental Health Interview Summary of Client Scores:  Facilitates participation in occupation Allows participation in occupation Inhibits participation in occupation Restricts participation in occupation Comments:  Roles   X    Habits   X    Personal Causation   X    Values  X     Interests   X    Skills   X    Short-Term Goals   X    Long-term Goals    X   Interpretation of Past Experiences  X     Physical Environment  X     Social Environment   X    Readiness for Change   X      Need for Occupational Therapy:  4 Shows positive occupational participation, no need for OT.   3 Need for minimal intervention/consultative participation   2 Need for OT intervention indicated to restore/improve participation   1 Need for extensive OT intervention indicated to improve participation.  Referral for follow up services also recommended.   Assessment:  Patient demonstrates behavior that INHIBITS participation in occupation.  Patient will benefit from occupational therapy intervention in order to improve time management, financial management, stress management, job readiness skills, social skills, and health management skills in preparation to return to full time community living and to be a productive community member.    Plan:  Patient will participate in skilled occupational therapy sessions individually or in a group setting to improve coping skills, psychosocial skills, and emotional  skills required to return to prior level of function. Treatment will be 4-5 times per week for 4 weeks.      Group Session:  O: In this communication group therapy session, facilitated by an occupational therapist, participants explored several key subtopics aimed at enhancing their interpersonal skills. The session began with a discussion on the use of "I" and "AND" statements, emphasizing personal responsibility and constructive language in expressing feelings and needs. Participants then practiced active listening techniques to improve their ability to fully understand and respond to others. The group also delved into assertive communication, learning how to express themselves confidently and respectfully. Emotional regulation skills were addressed, providing strategies for managing emotions during interactions. Social skills training included role-playing scenarios to build confidence in various  social contexts. Feedback and reflection were integral parts of the session, with participants offering and receiving constructive feedback to foster personal growth. The group identified common barriers to effective communication, such as anxiety, fear of judgment, and past negative experiences, and discussed strategies to overcome these challenges, including mindfulness practices and building a supportive network.   A: The patient actively participated in the group therapy session, demonstrating a high level of engagement and enthusiasm. They contributed to discussions on "I" and "AND" statements by sharing personal examples and insights. During the active listening exercises, the patient was attentive and provided thoughtful feedback to peers. They effectively practiced assertive communication techniques and showed a clear understanding of emotional regulation strategies. In social skills role-playing, the patient was confident and responsive, indicating a good grasp of the concepts. The patient also engaged  in the feedback and reflection segment, offering constructive comments and showing receptivity to feedback from others. Their proactive approach and willingness to explore personal barriers to communication suggest significant progress and motivation to improve interpersonal skills.                    OT Education - 12/17/22 1822     Education Details OCAIRS / Therapist, nutritional) Educated Patient    Methods Explanation;Handout    Comprehension Verbalized understanding              OT Short Term Goals - 12/17/22 1827       OT SHORT TERM GOAL #1   Title Client will develop and utilize a personalized coping toolbox containing at least five coping strategies to manage challenging situations, demonstrating their use in real-life scenarios by the end of therapy.    Time 4    Period Weeks    Status On-going    Target Date 01/06/23      OT SHORT TERM GOAL #2   Title Client will independently identify and modify three areas of the current routine that contribute to increased stress or dysfunction by the end of therapy.    Time 4    Period Weeks    Status On-going    Target Date 01/06/23      OT SHORT TERM GOAL #3   Title By the time of discharge, client will independently set, track, and make progress towards a long-term goal, demonstrating resilience in overcoming obstacles and seeking support when needed.    Status On-going                      Plan - 12/17/22 1825     Clinical Impression Statement Pt presents w/ deficits across multiple psychosocial domains that inhibit occupational performance in daily living.    OT Occupational Profile and History Problem Focused Assessment - Including review of records relating to presenting problem    Occupational performance deficits (Please refer to evaluation for details): IADL's;Rest and Sleep;Education;Leisure;Social Participation;Work    Environmental health practitioner;Interpersonal  Interaction;Routines and Behaviors;Habits    Rehab Potential Good    Clinical Decision Making Limited treatment options, no task modification necessary    Comorbidities Affecting Occupational Performance: None    Modification or Assistance to Complete Evaluation  No modification of tasks or assist necessary to complete eval    OT Frequency 5x / week    OT Duration 4 weeks    OT Treatment/Interventions Psychosocial skills training;Coping strategies training    Consulted and Agree with Plan of Care Patient  Patient will benefit from skilled therapeutic intervention in order to improve the following deficits and impairments:       Psychosocial Skills: Coping Strategies, Interpersonal Interaction, Routines and Behaviors, Habits   Visit Diagnosis: Difficulty coping    Problem List Patient Active Problem List   Diagnosis Date Noted   MDD (major depressive disorder), recurrent severe, without psychosis (HCC) 11/29/2022   Anxiety state 04/01/2022   Migraine with aura and without status migrainosus, not intractable 12/19/2021   Cubital tunnel syndrome of both upper extremities 11/03/2021   Polyarthralgia 09/27/2021   Iron deficiency anemia 09/27/2021   Mixed hyperlipidemia 09/27/2021   Surgical menopause 08/06/2016   Vitamin D deficiency 08/06/2016   Anxiety and depression 09/29/2015   History of DVT (deep vein thrombosis) 09/29/2015   Fibromyalgia 08/01/2015   Insomnia 08/01/2015   IC (interstitial cystitis) 03/21/2012   Polycystic kidney disease 10/09/2011    Ted Mcalpine, OT 12/17/2022, 6:29 PM  Kerrin Champagne, OT  [facility] HOSPITALIZATION PROGRAM [address] [place], [place], [address] Phone: [phone]   Fax:  [phone]  Name: Sharon Bowman MRN: [id number] Date of Birth: April 04, 1976

## 2022-12-17 NOTE — Telephone Encounter (Signed)
I don't think this would be appropriate as it was a psychiatric inpatient hold. Does she need me to fit her in somewhere?

## 2022-12-18 ENCOUNTER — Other Ambulatory Visit (HOSPITAL_COMMUNITY): Payer: Medicare HMO | Admitting: Licensed Clinical Social Worker

## 2022-12-18 ENCOUNTER — Encounter (HOSPITAL_COMMUNITY): Payer: Self-pay

## 2022-12-18 ENCOUNTER — Other Ambulatory Visit (HOSPITAL_COMMUNITY): Payer: Medicare HMO

## 2022-12-18 DIAGNOSIS — Z79899 Other long term (current) drug therapy: Secondary | ICD-10-CM | POA: Diagnosis not present

## 2022-12-18 DIAGNOSIS — F411 Generalized anxiety disorder: Secondary | ICD-10-CM

## 2022-12-18 DIAGNOSIS — G4700 Insomnia, unspecified: Secondary | ICD-10-CM | POA: Diagnosis not present

## 2022-12-18 DIAGNOSIS — F332 Major depressive disorder, recurrent severe without psychotic features: Secondary | ICD-10-CM | POA: Diagnosis not present

## 2022-12-18 DIAGNOSIS — R4589 Other symptoms and signs involving emotional state: Secondary | ICD-10-CM | POA: Diagnosis not present

## 2022-12-18 DIAGNOSIS — Z87891 Personal history of nicotine dependence: Secondary | ICD-10-CM | POA: Diagnosis not present

## 2022-12-18 NOTE — Therapy (Signed)
[facility] PARTIAL HOSPITALIZATION PROGRAM [address] [place], [place], [address] Phone: [phone]   Fax:  [phone]  Occupational Therapy Treatment Virtual Visit via Video Note  I connected with Sharon Bowman on 12/18/22 at  8:00 AM EDT by a video enabled telemedicine application and verified that I am speaking with the correct person using two identifiers.  Location: Patient: home Provider: office   I discussed the limitations of evaluation and management by telemedicine and the availability of in person appointments. The patient expressed understanding and agreed to proceed.    The patient was advised to call back or seek an in-person evaluation if the symptoms worsen or if the condition fails to improve as anticipated.  I provided 55 minutes of non-face-to-face time during this encounter.   Patient Details  Name: Sharon Bowman MRN: [id number] Date of Birth: 10/29/1975 No data recorded  Encounter Date: 12/17/2022   OT End of Session - 12/18/22 1120     Visit Number 2    Number of Visits 20    Date for OT Re-Evaluation 01/06/23    OT Start Time 1200    OT Stop Time 1255    OT Time Calculation (min) 55 min             Past Medical History:  Diagnosis Date   Allergic asthma 08/2015   Anemia    Anxiety    Arthritis    osetoarthritis   Chronic sinusitis    Depression    DVT (deep venous thrombosis) (HCC) 04/2014   right leg - behind calf, no treated with aspirin daily   DVT (deep venous thrombosis) (HCC)    Pt states hx DVT in R knee and R upper thigh in 2015   Endometriosis    Endometriosis    Family history of premature CAD    Fibromyalgia 2017   Former smoker    GAD (generalized anxiety disorder)    Headache    History of PFTs 08/2015   normal   Interstitial cystitis    Interstitial cystitis    PCOS (polycystic ovarian syndrome)    Polycystic kidney disease     Past Surgical History:  Procedure Laterality  Date   ABDOMINAL HYSTERECTOMY     BLADDER SURGERY     BLADDER STRETCH X 3   carpel radial tunnel  2014   carpel tunnel     left  and right hand    CYSTO WITH HYDRODISTENSION N/A 11/11/2014   Procedure: CYSTOSCOPY/HYDRODISTENSION MARCAINE AND PYRIDIUM AND Doreatha Lew;  Surgeon: Jethro Bolus, MD;  Location: [facility];  Service: Urology;  Laterality: N/A;   CYSTOSCOPY N/A 11/11/2014   Procedure: CYSTOSCOPY;  Surgeon: Noland Fordyce, MD;  Location: [facility];  Service: Gynecology;  Laterality: N/A;   CYSTOSCOPY WITH RETROGRADE PYELOGRAM, URETEROSCOPY AND STENT PLACEMENT Bilateral 11/11/2014   Procedure:  RETROGRADE PYELOGRAM with BILATERAL URETERAL CATHETHERS;  Surgeon: Jethro Bolus, MD;  Location: [facility];  Service: Urology;  Laterality: Bilateral;   IUD REMOVAL N/A 11/11/2014   Procedure: INTRAUTERINE DEVICE (IUD) REMOVAL;  Surgeon: Noland Fordyce, MD;  Location: [facility];  Service: Gynecology;  Laterality: N/A;   LAPAROSCOPY     X 2 ENDOMETRIOSIS.   LEG SURGERY  04/2014   right knee - tumor - benighn   ROBOTIC ASSISTED LAPAROSCOPIC LYSIS OF ADHESION N/A 11/11/2014   Procedure: ROBOTIC ASSISTED LAPAROSCOPIC LYSIS OF ADHESION;  Surgeon: Noland Fordyce, MD;  Location: [facility];  Service: Gynecology;  Laterality: N/A;  ROBOTIC ASSISTED TOTAL HYSTERECTOMY WITH BILATERAL SALPINGO OOPHERECTOMY Bilateral 11/11/2014   Procedure: ROBOTIC ASSISTED TOTAL HYSTERECTOMY WITH BILATERAL SALPINGO OOPHORECTOMY;  Surgeon: Noland Fordyce, MD;  Location: [facility];  Service: Gynecology;  Laterality: Bilateral;   TOOTH EXTRACTION      There were no vitals filed for this visit.   Subjective Assessment - 12/18/22 1120     Currently in Pain? No/denies    Pain Score 0-No pain               Group Session:  S: Doing better today I think.   O: In this communication group therapy session, facilitated by an occupational therapist, participants explored several key subtopics aimed at enhancing their  interpersonal skills. The session began with a discussion on the use of "I" and "AND" statements, emphasizing personal responsibility and constructive language in expressing feelings and needs. Participants then practiced active listening techniques to improve their ability to fully understand and respond to others. The group also delved into assertive communication, learning how to express themselves confidently and respectfully. Emotional regulation skills were addressed, providing strategies for managing emotions during interactions. Social skills training included role-playing scenarios to build confidence in various social contexts. Feedback and reflection were integral parts of the session, with participants offering and receiving constructive feedback to foster personal growth. The group identified common barriers to effective communication, such as anxiety, fear of judgment, and past negative experiences, and discussed strategies to overcome these challenges, including mindfulness practices and building a supportive network.   A: The patient actively participated in the group therapy session, demonstrating a high level of engagement and enthusiasm. They contributed to discussions on "I" and "AND" statements by sharing personal examples and insights. During the active listening exercises, the patient was attentive and provided thoughtful feedback to peers. They effectively practiced assertive communication techniques and showed a clear understanding of emotional regulation strategies. In social skills role-playing, the patient was confident and responsive, indicating a good grasp of the concepts. The patient also engaged in the feedback and reflection segment, offering constructive comments and showing receptivity to feedback from others. Their proactive approach and willingness to explore personal barriers to communication suggest significant progress and motivation to improve interpersonal  skills.   P: Continue to attend PHP OT group sessions 5x week for 4 weeks to promote daily structure, social engagement, and opportunities to develop and utilize adaptive strategies to maximize functional performance in preparation for safe transition and integration back into school, work, and the community. Plan to address topic of tbd in next OT group session.                    OT Education - 12/18/22 1120     Education Details Communication              OT Short Term Goals - 12/17/22 1827       OT SHORT TERM GOAL #1   Title Client will develop and utilize a personalized coping toolbox containing at least five coping strategies to manage challenging situations, demonstrating their use in real-life scenarios by the end of therapy.    Time 4    Period Weeks    Status On-going    Target Date 01/06/23      OT SHORT TERM GOAL #2   Title Client will independently identify and modify three areas of the current routine that contribute to increased stress or dysfunction by the end of therapy.    Time 4  Period Weeks    Status On-going    Target Date 01/06/23      OT SHORT TERM GOAL #3   Title By the time of discharge, client will independently set, track, and make progress towards a long-term goal, demonstrating resilience in overcoming obstacles and seeking support when needed.    Status On-going                      Plan - 12/18/22 1120     Psychosocial Skills Coping Strategies;Interpersonal Interaction;Routines and Behaviors;Habits             Patient will benefit from skilled therapeutic intervention in order to improve the following deficits and impairments:       Psychosocial Skills: Coping Strategies, Interpersonal Interaction, Routines and Behaviors, Habits   Visit Diagnosis: Difficulty coping    Problem List Patient Active Problem List   Diagnosis Date Noted   MDD (major depressive disorder), recurrent severe, without  psychosis (HCC) 11/29/2022   Anxiety state 04/01/2022   Migraine with aura and without status migrainosus, not intractable 12/19/2021   Cubital tunnel syndrome of both upper extremities 11/03/2021   Polyarthralgia 09/27/2021   Iron deficiency anemia 09/27/2021   Mixed hyperlipidemia 09/27/2021   Surgical menopause 08/06/2016   Vitamin D deficiency 08/06/2016   Anxiety and depression 09/29/2015   History of DVT (deep vein thrombosis) 09/29/2015   Fibromyalgia 08/01/2015   Insomnia 08/01/2015   IC (interstitial cystitis) 03/21/2012   Polycystic kidney disease 10/09/2011    Ted Mcalpine, OT 12/18/2022, 11:21 AM  Kerrin Champagne, OT   [facility] HOSPITALIZATION PROGRAM [address] [place], [place], [address] Phone: [phone]   Fax:  [phone]  Name: Sharon Bowman MRN: [id number] Date of Birth: [birth year]

## 2022-12-19 ENCOUNTER — Other Ambulatory Visit (HOSPITAL_COMMUNITY): Payer: Medicare HMO

## 2022-12-19 ENCOUNTER — Other Ambulatory Visit (HOSPITAL_COMMUNITY): Payer: Medicare HMO | Admitting: Professional

## 2022-12-19 DIAGNOSIS — R4589 Other symptoms and signs involving emotional state: Secondary | ICD-10-CM

## 2022-12-19 DIAGNOSIS — Z79899 Other long term (current) drug therapy: Secondary | ICD-10-CM | POA: Diagnosis not present

## 2022-12-19 DIAGNOSIS — F332 Major depressive disorder, recurrent severe without psychotic features: Secondary | ICD-10-CM

## 2022-12-19 DIAGNOSIS — Z87891 Personal history of nicotine dependence: Secondary | ICD-10-CM | POA: Diagnosis not present

## 2022-12-19 DIAGNOSIS — G4700 Insomnia, unspecified: Secondary | ICD-10-CM | POA: Diagnosis not present

## 2022-12-19 DIAGNOSIS — F411 Generalized anxiety disorder: Secondary | ICD-10-CM | POA: Diagnosis not present

## 2022-12-19 NOTE — Psych (Signed)
Virtual Visit via Video Note  I connected with [name] on 12/19/22 at  9:00 AM EDT by a video enabled telemedicine application and verified that I am speaking with the correct person using two identifiers.  Location: Patient: patient home Provider: clinical home office   I discussed the limitations of evaluation and management by telemedicine and the availability of in person appointments. The patient expressed understanding and agreed to proceed.  Follow Up Instructions:    I discussed the assessment and treatment plan with the patient. The patient was provided an opportunity to ask questions and all were answered. The patient agreed with the plan and demonstrated an understanding of the instructions.   The patient was advised to call back or seek an in-person evaluation if the symptoms worsen or if the condition fails to improve as anticipated.  I provided 240 minutes of non-face-to-face time during this encounter.   [name], [facility]   [facility] PHP THERAPIST PROGRESS NOTE  TEESHA GUNNERSON [phone]  Session Time: 9-10  Participation Level: Active  Behavioral Response: CasualAlertDepressed  Type of Therapy: Group Therapy  Treatment Goals addressed: Coping  Progress Towards Goals: Progressing  Interventions: CBT, DBT, Solution Focused, Strength-based, Supportive, and Reframing  Summary: MARGURIETE NEGASH is a 47 y.o. female who presents with depression and anxiety symptoms. Clinician led check-in regarding current stressors and situation, and review of patient completed daily inventory. Clinician utilized active listening and empathetic response and validated patient emotions. Clinician facilitated processing group on pertinent issues.?    Therapist Response: Patient arrived within time allowed. Patient rates her mood at a 7 on a scale of 1-10 with 10 being best. Pt states she feels "good." Pt states she slept 5 hours ("the best sleep I've had in a month")  and ate 3x yesterday. Pt rates her depression as a 6 and anxiety as an 8, on a scale of 1-10 with 10 being high. Pt reports she had a good afternoon. She sat outside and listened to [employer], "which always calms me." Pt reports she has been using a hairbow around her wrist to help with distraction from ruminations. Patient able to process. Patient engaged in discussion.            Session Time: 10:00 am - 11:00 am   Participation Level: Active   Behavioral Response: CasualAlertAnxious and Depressed   Type of Therapy: Group Therapy   Treatment Goals addressed: Coping   Progress Towards Goals: Progressing   Interventions: CBT, DBT, Solution Focused, Strength-based, Supportive, and Reframing   Therapist Response: Cln introduced topics of self-forgiveness and positive affirmations. Cln discussed how positive affirmations can help change the way we view self, and how to implement self-affirmations in every day life. Group discussed difficulty to forgive self when allowing grace and space to others, and how to allow for self.    Therapist Response:  Pt engaged in discussion and identifies self-affirmations could be helpful. Pt reports she continues to struggle with giving self as much grace and space she allows others.            Session Time: 11:00 -12:00   Participation Level: Active   Behavioral Response: CasualAlertDepressed   Type of Therapy: Group Therapy, Spiritual Care   Treatment Goals addressed: Coping   Progress Towards Goals: Progressing   Interventions: Supportive, Education   Summary:  Laurell Josephs, Chaplain, led group.   Therapist Response: Pt participated         Session Time: 12:00  pm - 1:00 pm   Participation Level: Active   Behavioral Response: CasualAlertAnxious and Depressed   Type of Therapy: Group Therapy   Treatment Goals addressed: Coping   Progress Towards Goals: Progressing   Interventions: CBT, DBT, Solution Focused, Strength-based,  Supportive, and Reframing   Therapist Response: 12:00 - 12:50 pm: OT group led by cln E. Hollan 12:50 - 1:00 pm: Clinician led check-out. Clinician assessed for immediate needs, medication compliance and efficacy, and safety concerns?   Summary: 12:00 - 12:50 pm: Pt participated 12:50 - 1:00 pm: At check-out, patient reports no immediate concerns.??Patient demonstrates progress as evidenced by continued engagement and responsiveness to treatment. Patient denies SI/HI/self-harm thoughts at the end of group.   Suicidal/Homicidal: Nowithout intent/plan  Plan: Pt will continue in PHP while working to decrease depression and anxiety symptoms, increase emotion regulation, and increase ability to manage symptoms in a healthy manner.   Collaboration of Care: Psychiatrist AEB Eliseo Gum  Patient/Guardian was advised Release of Information must be obtained prior to any record release in order to collaborate their care with an outside provider. Patient/Guardian was advised if they have not already done so to contact the registration department to sign all necessary forms in order for [place] to release information regarding their care.   Consent: Patient/Guardian gives verbal consent for treatment and assignment of benefits for services provided during this visit. Patient/Guardian expressed understanding and agreed to proceed.   Diagnosis: MDD (major depressive disorder), recurrent severe, without psychosis (HCC) [F33.2]    1. MDD (major depressive disorder), recurrent severe, without psychosis (HCC)       [name], [facility] 12/19/2022

## 2022-12-20 ENCOUNTER — Encounter (HOSPITAL_COMMUNITY): Payer: Self-pay

## 2022-12-20 ENCOUNTER — Other Ambulatory Visit (HOSPITAL_COMMUNITY): Payer: Medicare HMO

## 2022-12-20 ENCOUNTER — Telehealth (HOSPITAL_COMMUNITY): Payer: Self-pay | Admitting: Psychiatry

## 2022-12-20 ENCOUNTER — Ambulatory Visit (INDEPENDENT_AMBULATORY_CARE_PROVIDER_SITE_OTHER): Payer: Medicare HMO | Admitting: Psychiatry

## 2022-12-20 ENCOUNTER — Encounter (HOSPITAL_COMMUNITY): Payer: Self-pay | Admitting: Psychiatry

## 2022-12-20 VITALS — BP 128/93 | HR 83 | Ht 61.0 in | Wt 140.0 lb

## 2022-12-20 DIAGNOSIS — F332 Major depressive disorder, recurrent severe without psychotic features: Secondary | ICD-10-CM

## 2022-12-20 DIAGNOSIS — F411 Generalized anxiety disorder: Secondary | ICD-10-CM | POA: Diagnosis not present

## 2022-12-20 DIAGNOSIS — R4589 Other symptoms and signs involving emotional state: Secondary | ICD-10-CM

## 2022-12-20 MED ORDER — QUETIAPINE FUMARATE 25 MG PO TABS
25.0000 mg | ORAL_TABLET | Freq: Every day | ORAL | 0 refills | Status: DC
Start: 2022-12-20 — End: 2023-01-08

## 2022-12-20 NOTE — Telephone Encounter (Signed)
Patient called stating she forgot during her appointment today to ask provider for a refill of:  DULoxetine (CYMBALTA) 30 MG capsule. States she takes 3 capsules daily.   [id number] [place], [place] - [address] [facility] [address] (Ph: [phone])   Last ordered: 12/07/2022 - 30 capsules (by Dr. Enedina Finner)   Last appointment: 12/20/2022  Next appointment: 01/08/2023

## 2022-12-20 NOTE — Therapy (Signed)
[facility] PARTIAL HOSPITALIZATION PROGRAM [address] [place], [place], [address] Phone: [phone]   Fax:  [phone]  Occupational Therapy Treatment Virtual Visit via Video Note  I connected with [name] on 12/20/22 at  8:00 AM EDT by a video enabled telemedicine application and verified that I am speaking with the correct person using two identifiers.  Location: Patient: home Provider: office   I discussed the limitations of evaluation and management by telemedicine and the availability of in person appointments. The patient expressed understanding and agreed to proceed.    The patient was advised to call back or seek an in-person evaluation if the symptoms worsen or if the condition fails to improve as anticipated.  I provided 55 minutes of non-face-to-face time during this encounter.   Patient Details  Name: Sharon Bowman MRN: [id number] Date of Birth: [birth year] No data recorded  Encounter Date: 12/18/2022   OT End of Session - 12/20/22 2327     Visit Number 3    Number of Visits 20    Date for OT Re-Evaluation 01/06/23    OT Start Time 1200    OT Stop Time 1255    OT Time Calculation (min) 55 min             Past Medical History:  Diagnosis Date   Allergic asthma 08/2015   Anemia    Anxiety    Arthritis    osetoarthritis   Chronic sinusitis    Depression    DVT (deep venous thrombosis) (HCC) 04/2014   right leg - behind calf, no treated with aspirin daily   DVT (deep venous thrombosis) (HCC)    Pt states hx DVT in R knee and R upper thigh in 2015   Endometriosis    Endometriosis    Family history of premature CAD    Fibromyalgia 2017   Former smoker    GAD (generalized anxiety disorder)    Headache    History of PFTs 08/2015   normal   Interstitial cystitis    Interstitial cystitis    PCOS (polycystic ovarian syndrome)    Polycystic kidney disease     Past Surgical History:  Procedure Laterality  Date   ABDOMINAL HYSTERECTOMY     BLADDER SURGERY     BLADDER STRETCH X 3   carpel radial tunnel  2014   carpel tunnel     left  and right hand    CYSTO WITH HYDRODISTENSION N/A 11/11/2014   Procedure: CYSTOSCOPY/HYDRODISTENSION MARCAINE AND PYRIDIUM AND Doreatha Lew;  Surgeon: Jethro Bolus, MD;  Location: [facility];  Service: Urology;  Laterality: N/A;   CYSTOSCOPY N/A 11/11/2014   Procedure: CYSTOSCOPY;  Surgeon: Noland Fordyce, MD;  Location: [facility];  Service: Gynecology;  Laterality: N/A;   CYSTOSCOPY WITH RETROGRADE PYELOGRAM, URETEROSCOPY AND STENT PLACEMENT Bilateral 11/11/2014   Procedure:  RETROGRADE PYELOGRAM with BILATERAL URETERAL CATHETHERS;  Surgeon: Jethro Bolus, MD;  Location: [facility];  Service: Urology;  Laterality: Bilateral;   IUD REMOVAL N/A 11/11/2014   Procedure: INTRAUTERINE DEVICE (IUD) REMOVAL;  Surgeon: Noland Fordyce, MD;  Location: [facility];  Service: Gynecology;  Laterality: N/A;   LAPAROSCOPY     X 2 ENDOMETRIOSIS.   LEG SURGERY  04/2014   right knee - tumor - benighn   ROBOTIC ASSISTED LAPAROSCOPIC LYSIS OF ADHESION N/A 11/11/2014   Procedure: ROBOTIC ASSISTED LAPAROSCOPIC LYSIS OF ADHESION;  Surgeon: Noland Fordyce, MD;  Location: [facility];  Service: Gynecology;  Laterality: N/A;  ROBOTIC ASSISTED TOTAL HYSTERECTOMY WITH BILATERAL SALPINGO OOPHERECTOMY Bilateral 11/11/2014   Procedure: ROBOTIC ASSISTED TOTAL HYSTERECTOMY WITH BILATERAL SALPINGO OOPHORECTOMY;  Surgeon: Noland Fordyce, MD;  Location: [facility];  Service: Gynecology;  Laterality: Bilateral;   TOOTH EXTRACTION      There were no vitals filed for this visit.   Subjective Assessment - 12/20/22 2326     Currently in Pain? No/denies    Pain Score 0-No pain                Group Session:  S: Feeling better today.   O:  The objective of the session is to enhance the social interaction skills of adults with depression, anxiety, and other mental health disorders to improve their daily  functioning and quality of life. The discussion will cover the impact of mental health on social capabilities, identifying barriers like fear of judgment, low self-esteem, and difficulty interpreting non-verbal cues. It will introduce strategies such as structured social activities, social skills training, and gradual exposure, with practical examples like joining book clubs or volunteering. The session aims to equip participants with tools to overcome social challenges, build meaningful relationships, and actively participate in social activities, all within the supportive framework of occupational therapy.   A: The engaged patient demonstrated a proactive approach during the session, actively participating in discussions and expressing a keen interest in strategies to improve social interactions. They shared personal experiences and challenges related to social anxiety and depression, indicating a high level of self-awareness and motivation to change. The patient was receptive to the suggested strategies, particularly showing interest in structured social activities and gradual exposure techniques. They asked relevant questions, suggesting a deep understanding of how their mental health affects their social interactions. This engagement level indicates a readiness to implement learned strategies and a positive prognosis for improving social skills and overall mental wellness.    P: Continue to attend PHP OT group sessions 5x week for 4 weeks to promote daily structure, social engagement, and opportunities to develop and utilize adaptive strategies to maximize functional performance in preparation for safe transition and integration back into school, work, and the community. Plan to address topic of pt 2 in next OT group session.                   OT Education - 12/20/22 2326     Education Details Improving Social Interactions              OT Short Term Goals - 12/17/22 1827        OT SHORT TERM GOAL #1   Title Client will develop and utilize a personalized coping toolbox containing at least five coping strategies to manage challenging situations, demonstrating their use in real-life scenarios by the end of therapy.    Time 4    Period Weeks    Status On-going    Target Date 01/06/23      OT SHORT TERM GOAL #2   Title Client will independently identify and modify three areas of the current routine that contribute to increased stress or dysfunction by the end of therapy.    Time 4    Period Weeks    Status On-going    Target Date 01/06/23      OT SHORT TERM GOAL #3   Title By the time of discharge, client will independently set, track, and make progress towards a long-term goal, demonstrating resilience in overcoming obstacles and seeking support when needed.    Status On-going  Plan - 12/20/22 2327     Psychosocial Skills Coping Strategies;Interpersonal Interaction;Routines and Behaviors;Habits             Patient will benefit from skilled therapeutic intervention in order to improve the following deficits and impairments:       Psychosocial Skills: Coping Strategies, Interpersonal Interaction, Routines and Behaviors, Habits   Visit Diagnosis: Difficulty coping    Problem List Patient Active Problem List   Diagnosis Date Noted   MDD (major depressive disorder), recurrent severe, without psychosis (HCC) 11/29/2022   Anxiety state 04/01/2022   Migraine with aura and without status migrainosus, not intractable 12/19/2021   Cubital tunnel syndrome of both upper extremities 11/03/2021   Polyarthralgia 09/27/2021   Iron deficiency anemia 09/27/2021   Mixed hyperlipidemia 09/27/2021   Surgical menopause 08/06/2016   Vitamin D deficiency 08/06/2016   Anxiety and depression 09/29/2015   History of DVT (deep vein thrombosis) 09/29/2015   Fibromyalgia 08/01/2015   Insomnia 08/01/2015   IC (interstitial cystitis)  03/21/2012   Polycystic kidney disease 10/09/2011    Ted Mcalpine, OT 12/20/2022, 11:27 PM Kerrin Champagne, OT  [facility] HOSPITALIZATION PROGRAM [address] [place], [place], [address] Phone: [phone]   Fax:  [phone]  Name: FAIZA KURZAWA MRN: [id number] Date of Birth: 09-Jan-1976

## 2022-12-20 NOTE — Therapy (Signed)
[facility] PARTIAL HOSPITALIZATION PROGRAM [address] [place], [place], [address] Phone: [phone]   Fax:  [phone]  Occupational Therapy Treatment Virtual Visit via Video Note  I connected with Sharon Bowman on 12/20/22 at  8:00 AM EDT by a video enabled telemedicine application and verified that I am speaking with the correct person using two identifiers.  Location: Patient: home Provider: office   I discussed the limitations of evaluation and management by telemedicine and the availability of in person appointments. The patient expressed understanding and agreed to proceed.    The patient was advised to call back or seek an in-person evaluation if the symptoms worsen or if the condition fails to improve as anticipated.  I provided 55 minutes of non-face-to-face time during this encounter.   Patient Details  Name: Sharon Bowman MRN: [id number] Date of Birth: 08-13-1975 No data recorded  Encounter Date: 12/19/2022   OT End of Session - 12/20/22 2343     Visit Number 4    Number of Visits 20    Date for OT Re-Evaluation 01/06/23    OT Start Time 1200    OT Stop Time 1255    OT Time Calculation (min) 55 min             Past Medical History:  Diagnosis Date   Allergic asthma 08/2015   Anemia    Anxiety    Arthritis    osetoarthritis   Chronic sinusitis    Depression    DVT (deep venous thrombosis) (HCC) 04/2014   right leg - behind calf, no treated with aspirin daily   DVT (deep venous thrombosis) (HCC)    Pt states hx DVT in R knee and R upper thigh in 2015   Endometriosis    Endometriosis    Family history of premature CAD    Fibromyalgia 2017   Former smoker    GAD (generalized anxiety disorder)    Headache    History of PFTs 08/2015   normal   Interstitial cystitis    Interstitial cystitis    PCOS (polycystic ovarian syndrome)    Polycystic kidney disease     Past Surgical History:  Procedure Laterality  Date   ABDOMINAL HYSTERECTOMY     BLADDER SURGERY     BLADDER STRETCH X 3   carpel radial tunnel  2014   carpel tunnel     left  and right hand    CYSTO WITH HYDRODISTENSION N/A 11/11/2014   Procedure: CYSTOSCOPY/HYDRODISTENSION MARCAINE AND PYRIDIUM AND Doreatha Lew;  Surgeon: Jethro Bolus, MD;  Location: [facility];  Service: Urology;  Laterality: N/A;   CYSTOSCOPY N/A 11/11/2014   Procedure: CYSTOSCOPY;  Surgeon: Noland Fordyce, MD;  Location: [facility];  Service: Gynecology;  Laterality: N/A;   CYSTOSCOPY WITH RETROGRADE PYELOGRAM, URETEROSCOPY AND STENT PLACEMENT Bilateral 11/11/2014   Procedure:  RETROGRADE PYELOGRAM with BILATERAL URETERAL CATHETHERS;  Surgeon: Jethro Bolus, MD;  Location: [facility];  Service: Urology;  Laterality: Bilateral;   IUD REMOVAL N/A 11/11/2014   Procedure: INTRAUTERINE DEVICE (IUD) REMOVAL;  Surgeon: Noland Fordyce, MD;  Location: [facility];  Service: Gynecology;  Laterality: N/A;   LAPAROSCOPY     X 2 ENDOMETRIOSIS.   LEG SURGERY  04/2014   right knee - tumor - benighn   ROBOTIC ASSISTED LAPAROSCOPIC LYSIS OF ADHESION N/A 11/11/2014   Procedure: ROBOTIC ASSISTED LAPAROSCOPIC LYSIS OF ADHESION;  Surgeon: Noland Fordyce, MD;  Location: [facility];  Service: Gynecology;  Laterality: N/A;  ROBOTIC ASSISTED TOTAL HYSTERECTOMY WITH BILATERAL SALPINGO OOPHERECTOMY Bilateral 11/11/2014   Procedure: ROBOTIC ASSISTED TOTAL HYSTERECTOMY WITH BILATERAL SALPINGO OOPHORECTOMY;  Surgeon: Noland Fordyce, MD;  Location: [facility];  Service: Gynecology;  Laterality: Bilateral;   TOOTH EXTRACTION      There were no vitals filed for this visit.   Subjective Assessment - 12/20/22 2343     Currently in Pain? No/denies    Pain Score 0-No pain                 Group Session:  S: Doing better today I believe.   O:  The objective of the session is to enhance the social interaction skills of adults with depression, anxiety, and other mental health disorders to improve  their daily functioning and quality of life. The discussion will cover the impact of mental health on social capabilities, identifying barriers like fear of judgment, low self-esteem, and difficulty interpreting non-verbal cues. It will introduce strategies such as structured social activities, social skills training, and gradual exposure, with practical examples like joining book clubs or volunteering. The session aims to equip participants with tools to overcome social challenges, build meaningful relationships, and actively participate in social activities, all within the supportive framework of occupational therapy.   A: The engaged patient demonstrated a proactive approach during the session, actively participating in discussions and expressing a keen interest in strategies to improve social interactions. They shared personal experiences and challenges related to social anxiety and depression, indicating a high level of self-awareness and motivation to change. The patient was receptive to the suggested strategies, particularly showing interest in structured social activities and gradual exposure techniques. They asked relevant questions, suggesting a deep understanding of how their mental health affects their social interactions. This engagement level indicates a readiness to implement learned strategies and a positive prognosis for improving social skills and overall mental wellness.    P: Continue to attend PHP OT group sessions 5x week for 4 weeks to promote daily structure, social engagement, and opportunities to develop and utilize adaptive strategies to maximize functional performance in preparation for safe transition and integration back into school, work, and the community. Plan to address topic of ISI cont'd in next OT group session.                  OT Education - 12/20/22 2343     Education Details Improving Social Interactions              OT Short Term Goals -  12/17/22 1827       OT SHORT TERM GOAL #1   Title Client will develop and utilize a personalized coping toolbox containing at least five coping strategies to manage challenging situations, demonstrating their use in real-life scenarios by the end of therapy.    Time 4    Period Weeks    Status On-going    Target Date 01/06/23      OT SHORT TERM GOAL #2   Title Client will independently identify and modify three areas of the current routine that contribute to increased stress or dysfunction by the end of therapy.    Time 4    Period Weeks    Status On-going    Target Date 01/06/23      OT SHORT TERM GOAL #3   Title By the time of discharge, client will independently set, track, and make progress towards a long-term goal, demonstrating resilience in overcoming obstacles and seeking support when needed.  Status On-going                      Plan - 12/20/22 2344     Psychosocial Skills Coping Strategies;Interpersonal Interaction;Routines and Behaviors;Habits             Patient will benefit from skilled therapeutic intervention in order to improve the following deficits and impairments:       Psychosocial Skills: Coping Strategies, Interpersonal Interaction, Routines and Behaviors, Habits   Visit Diagnosis: Difficulty coping    Problem List Patient Active Problem List   Diagnosis Date Noted   MDD (major depressive disorder), recurrent severe, without psychosis (HCC) 11/29/2022   Anxiety state 04/01/2022   Migraine with aura and without status migrainosus, not intractable 12/19/2021   Cubital tunnel syndrome of both upper extremities 11/03/2021   Polyarthralgia 09/27/2021   Iron deficiency anemia 09/27/2021   Mixed hyperlipidemia 09/27/2021   Surgical menopause 08/06/2016   Vitamin D deficiency 08/06/2016   Anxiety and depression 09/29/2015   History of DVT (deep vein thrombosis) 09/29/2015   Fibromyalgia 08/01/2015   Insomnia 08/01/2015   IC  (interstitial cystitis) 03/21/2012   Polycystic kidney disease 10/09/2011    Ted Mcalpine, OT 12/20/2022, 11:44 PM  Kerrin Champagne, OT   [facility] HOSPITALIZATION PROGRAM [address] [place], [place], [address] Phone: [phone]   Fax:  [phone]  Name: Sharon Bowman MRN: [id number] Date of Birth: 05/18/1976
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
## 2022-12-21 ENCOUNTER — Encounter (HOSPITAL_COMMUNITY): Payer: Self-pay

## 2022-12-21 ENCOUNTER — Other Ambulatory Visit (HOSPITAL_COMMUNITY): Payer: Medicare HMO | Admitting: Professional

## 2022-12-21 ENCOUNTER — Other Ambulatory Visit (HOSPITAL_COMMUNITY): Payer: Medicare HMO

## 2022-12-21 DIAGNOSIS — F332 Major depressive disorder, recurrent severe without psychotic features: Secondary | ICD-10-CM

## 2022-12-21 DIAGNOSIS — R4589 Other symptoms and signs involving emotional state: Secondary | ICD-10-CM | POA: Diagnosis not present

## 2022-12-21 DIAGNOSIS — F411 Generalized anxiety disorder: Secondary | ICD-10-CM | POA: Diagnosis not present

## 2022-12-21 DIAGNOSIS — Z79899 Other long term (current) drug therapy: Secondary | ICD-10-CM | POA: Diagnosis not present

## 2022-12-21 DIAGNOSIS — G4700 Insomnia, unspecified: Secondary | ICD-10-CM | POA: Diagnosis not present

## 2022-12-21 DIAGNOSIS — Z87891 Personal history of nicotine dependence: Secondary | ICD-10-CM | POA: Diagnosis not present

## 2022-12-21 MED ORDER — DULOXETINE HCL 30 MG PO CPEP: 90.0000 mg | ORAL_CAPSULE | Freq: Every day | ORAL | 1 refills | Status: DC

## 2022-12-21 NOTE — Therapy (Signed)
[facility] PARTIAL HOSPITALIZATION PROGRAM [address] [place], [place], [address] Phone: [phone]   Fax:  [phone]  Occupational Therapy Treatment Virtual Visit via Video Note  I connected with [name] on 12/21/22 at  8:00 AM EDT by a video enabled telemedicine application and verified that I am speaking with the correct person using two identifiers.  Location: Patient: home Provider: office   I discussed the limitations of evaluation and management by telemedicine and the availability of in person appointments. The patient expressed understanding and agreed to proceed.    The patient was advised to call back or seek an in-person evaluation if the symptoms worsen or if the condition fails to improve as anticipated.  I provided 55 minutes of non-face-to-face time during this encounter.   Patient Details  Name: Sharon Bowman MRN: [id number] Date of Birth: 1976/03/03 No data recorded  Encounter Date: 12/21/2022   OT End of Session - 12/21/22 2333     Visit Number 5    Number of Visits 20    Date for OT Re-Evaluation 01/06/23    OT Start Time 1200    OT Stop Time 1255    OT Time Calculation (min) 55 min             Past Medical History:  Diagnosis Date   Allergic asthma 08/2015   Anemia    Anxiety    Arthritis    osetoarthritis   Chronic sinusitis    Depression    DVT (deep venous thrombosis) (HCC) 04/2014   right leg - behind calf, no treated with aspirin daily   DVT (deep venous thrombosis) (HCC)    Pt states hx DVT in R knee and R upper thigh in 2015   Endometriosis    Endometriosis    Family history of premature CAD    Fibromyalgia 2017   Former smoker    GAD (generalized anxiety disorder)    Headache    History of PFTs 08/2015   normal   Interstitial cystitis    Interstitial cystitis    PCOS (polycystic ovarian syndrome)    Polycystic kidney disease     Past Surgical History:  Procedure Laterality  Date   ABDOMINAL HYSTERECTOMY     BLADDER SURGERY     BLADDER STRETCH X 3   carpel radial tunnel  2014   carpel tunnel     left  and right hand    CYSTO WITH HYDRODISTENSION N/A 11/11/2014   Procedure: CYSTOSCOPY/HYDRODISTENSION MARCAINE AND PYRIDIUM AND Doreatha Lew;  Surgeon: Jethro Bolus, MD;  Location: [facility];  Service: Urology;  Laterality: N/A;   CYSTOSCOPY N/A 11/11/2014   Procedure: CYSTOSCOPY;  Surgeon: Noland Fordyce, MD;  Location: [facility];  Service: Gynecology;  Laterality: N/A;   CYSTOSCOPY WITH RETROGRADE PYELOGRAM, URETEROSCOPY AND STENT PLACEMENT Bilateral 11/11/2014   Procedure:  RETROGRADE PYELOGRAM with BILATERAL URETERAL CATHETHERS;  Surgeon: Jethro Bolus, MD;  Location: [facility];  Service: Urology;  Laterality: Bilateral;   IUD REMOVAL N/A 11/11/2014   Procedure: INTRAUTERINE DEVICE (IUD) REMOVAL;  Surgeon: Noland Fordyce, MD;  Location: [facility];  Service: Gynecology;  Laterality: N/A;   LAPAROSCOPY     X 2 ENDOMETRIOSIS.   LEG SURGERY  04/2014   right knee - tumor - benighn   ROBOTIC ASSISTED LAPAROSCOPIC LYSIS OF ADHESION N/A 11/11/2014   Procedure: ROBOTIC ASSISTED LAPAROSCOPIC LYSIS OF ADHESION;  Surgeon: Noland Fordyce, MD;  Location: [facility];  Service: Gynecology;  Laterality: N/A;  ROBOTIC ASSISTED TOTAL HYSTERECTOMY WITH BILATERAL SALPINGO OOPHERECTOMY Bilateral 11/11/2014   Procedure: ROBOTIC ASSISTED TOTAL HYSTERECTOMY WITH BILATERAL SALPINGO OOPHORECTOMY;  Surgeon: Noland Fordyce, MD;  Location: [facility];  Service: Gynecology;  Laterality: Bilateral;   TOOTH EXTRACTION      There were no vitals filed for this visit.   Subjective Assessment - 12/21/22 2333     Currently in Pain? No/denies    Pain Score 0-No pain                Group Session:  S: Feeling some bit better today overall.   O: In this group therapy session, the objective was to explore the multifaceted concept of purpose. The participants engaged in a deep exploration of the  innate human impulse for purposeful engagement and the impact of purpose on mental and emotional well-being. The discussion centered around the challenges of modern life, including feelings of isolation despite technological advancements, the connection between purposelessness and depression/anxiety, and the importance of occupational roles in achieving fulfillment. Strategies were shared to identify areas of life where purpose is lacking and to develop immediate and actionable plans to overcome these challenges. The participants gained valuable insights into the intersection of purpose and mental health, as well as practical tools to navigate their personal journeys towards purpose-driven lives. Overall, the session fostered a sense of empowerment and inspired the participants to take proactive steps towards aligning their lives with their true purpose.   A: Patient demonstrated a high level of engagement and active participation. They actively contributed to the discussions, sharing personal insights and experiences related to the concept of purpose. Patient demonstrated a clear understanding of the relationship between purpose and mental well-being, acknowledging the importance of aligning actions with values and setting meaningful goals. They actively engaged in self-reflection exercises and expressed a commitment to incorporating strategies discussed in the session into their daily life. Patient exhibited a strong motivation to cultivate purpose and showed a willingness to take proactive steps towards living a more purposeful and fulfilling life. Overall, Patient's active participation and enthusiasm contributed to a positive and collaborative therapeutic environment.   P: Continue to attend PHP OT group sessions 5x week for 4 weeks to promote daily structure, social engagement, and opportunities to develop and utilize adaptive strategies to maximize functional performance in preparation for safe  transition and integration back into school, work, and the community. Plan to address topic of pt 2 in next OT group session.                    OT Education - 12/21/22 2333     Education Details Discovering Purpose 1              OT Short Term Goals - 12/17/22 1827       OT SHORT TERM GOAL #1   Title Client will develop and utilize a personalized coping toolbox containing at least five coping strategies to manage challenging situations, demonstrating their use in real-life scenarios by the end of therapy.    Time 4    Period Weeks    Status On-going    Target Date 01/06/23      OT SHORT TERM GOAL #2   Title Client will independently identify and modify three areas of the current routine that contribute to increased stress or dysfunction by the end of therapy.    Time 4    Period Weeks    Status On-going    Target Date 01/06/23  OT SHORT TERM GOAL #3   Title By the time of discharge, client will independently set, track, and make progress towards a long-term goal, demonstrating resilience in overcoming obstacles and seeking support when needed.    Status On-going                      Plan - 12/21/22 2334     Psychosocial Skills Coping Strategies;Interpersonal Interaction;Routines and Behaviors;Habits             Patient will benefit from skilled therapeutic intervention in order to improve the following deficits and impairments:       Psychosocial Skills: Coping Strategies, Interpersonal Interaction, Routines and Behaviors, Habits   Visit Diagnosis: Difficulty coping    Problem List Patient Active Problem List   Diagnosis Date Noted   MDD (major depressive disorder), recurrent severe, without psychosis (HCC) 11/29/2022   Anxiety state 04/01/2022   Migraine with aura and without status migrainosus, not intractable 12/19/2021   Cubital tunnel syndrome of both upper extremities 11/03/2021   Polyarthralgia 09/27/2021   Iron  deficiency anemia 09/27/2021   Mixed hyperlipidemia 09/27/2021   Surgical menopause 08/06/2016   Vitamin D deficiency 08/06/2016   Anxiety and depression 09/29/2015   History of DVT (deep vein thrombosis) 09/29/2015   Fibromyalgia 08/01/2015   Insomnia 08/01/2015   IC (interstitial cystitis) 03/21/2012   Polycystic kidney disease 10/09/2011    Ted Mcalpine, OT 12/21/2022, 11:34 PM  Kerrin Champagne, OT   [facility] HOSPITALIZATION PROGRAM [address] [place], [place], [address] Phone: [phone]   Fax:  [phone]  Name: Sharon Bowman MRN: [id number] Date of Birth: 01/13/1976
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
## 2022-12-24 ENCOUNTER — Other Ambulatory Visit (HOSPITAL_COMMUNITY): Payer: Medicare HMO

## 2022-12-24 ENCOUNTER — Other Ambulatory Visit (HOSPITAL_COMMUNITY): Payer: Medicare HMO | Admitting: Licensed Clinical Social Worker

## 2022-12-24 DIAGNOSIS — F332 Major depressive disorder, recurrent severe without psychotic features: Secondary | ICD-10-CM

## 2022-12-24 DIAGNOSIS — Z79899 Other long term (current) drug therapy: Secondary | ICD-10-CM | POA: Diagnosis not present

## 2022-12-24 DIAGNOSIS — F411 Generalized anxiety disorder: Secondary | ICD-10-CM | POA: Diagnosis not present

## 2022-12-24 DIAGNOSIS — G4700 Insomnia, unspecified: Secondary | ICD-10-CM | POA: Diagnosis not present

## 2022-12-24 DIAGNOSIS — R4589 Other symptoms and signs involving emotional state: Secondary | ICD-10-CM | POA: Diagnosis not present

## 2022-12-24 DIAGNOSIS — Z87891 Personal history of nicotine dependence: Secondary | ICD-10-CM | POA: Diagnosis not present

## 2022-12-24 NOTE — Progress Notes (Signed)
Spoke with patient via Teams video call, used 2 identifiers to correctly identify patient. States that groups are going well. She is sleeping better with Seroquel but still not getting a full nights sleep. On scale 1-10 as 10 being worst she rates depression at 6 and anxiety at 7. Denies SI/HI or AV hallucinations. No side effects from medication. No issues or complaints.
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
## 2022-12-25 ENCOUNTER — Encounter (HOSPITAL_COMMUNITY): Payer: Self-pay

## 2022-12-25 ENCOUNTER — Other Ambulatory Visit (HOSPITAL_COMMUNITY): Payer: Medicare HMO

## 2022-12-25 ENCOUNTER — Ambulatory Visit (INDEPENDENT_AMBULATORY_CARE_PROVIDER_SITE_OTHER): Payer: Medicare HMO | Admitting: Licensed Clinical Social Worker

## 2022-12-25 DIAGNOSIS — F331 Major depressive disorder, recurrent, moderate: Secondary | ICD-10-CM

## 2022-12-25 NOTE — Therapy (Signed)
[facility] PARTIAL HOSPITALIZATION PROGRAM [address] [place], [place], [address] Phone: [phone]   Fax:  [phone]  Occupational Therapy Treatment Virtual Visit via Video Note  I connected with [name] on 12/25/22 at  8:00 AM EDT by a video enabled telemedicine application and verified that I am speaking with the correct person using two identifiers.  Location: Patient: home Provider: office   I discussed the limitations of evaluation and management by telemedicine and the availability of in person appointments. The patient expressed understanding and agreed to proceed.    The patient was advised to call back or seek an in-person evaluation if the symptoms worsen or if the condition fails to improve as anticipated.  I provided 55 minutes of non-face-to-face time during this encounter.  Patient Details  Name: Sharon Bowman MRN: [id number] Date of Birth: [birth year] No data recorded  Encounter Date: 12/24/2022   OT End of Session - 12/25/22 1649     Visit Number 6    Number of Visits 20    Date for OT Re-Evaluation 01/06/23    OT Start Time 1200    OT Stop Time 1255    OT Time Calculation (min) 55 min             Past Medical History:  Diagnosis Date   Allergic asthma 08/2015   Anemia    Anxiety    Arthritis    osetoarthritis   Chronic sinusitis    Depression    DVT (deep venous thrombosis) (HCC) 04/2014   right leg - behind calf, no treated with aspirin daily   DVT (deep venous thrombosis) (HCC)    Pt states hx DVT in R knee and R upper thigh in 2015   Endometriosis    Endometriosis    Family history of premature CAD    Fibromyalgia 2017   Former smoker    GAD (generalized anxiety disorder)    Headache    History of PFTs 08/2015   normal   Interstitial cystitis    Interstitial cystitis    PCOS (polycystic ovarian syndrome)    Polycystic kidney disease     Past Surgical History:  Procedure Laterality  Date   ABDOMINAL HYSTERECTOMY     BLADDER SURGERY     BLADDER STRETCH X 3   carpel radial tunnel  2014   carpel tunnel     left  and right hand    CYSTO WITH HYDRODISTENSION N/A 11/11/2014   Procedure: CYSTOSCOPY/HYDRODISTENSION MARCAINE AND PYRIDIUM AND Doreatha Lew;  Surgeon: Jethro Bolus, MD;  Location: [facility];  Service: Urology;  Laterality: N/A;   CYSTOSCOPY N/A 11/11/2014   Procedure: CYSTOSCOPY;  Surgeon: Noland Fordyce, MD;  Location: [facility];  Service: Gynecology;  Laterality: N/A;   CYSTOSCOPY WITH RETROGRADE PYELOGRAM, URETEROSCOPY AND STENT PLACEMENT Bilateral 11/11/2014   Procedure:  RETROGRADE PYELOGRAM with BILATERAL URETERAL CATHETHERS;  Surgeon: Jethro Bolus, MD;  Location: [facility];  Service: Urology;  Laterality: Bilateral;   IUD REMOVAL N/A 11/11/2014   Procedure: INTRAUTERINE DEVICE (IUD) REMOVAL;  Surgeon: Noland Fordyce, MD;  Location: [facility];  Service: Gynecology;  Laterality: N/A;   LAPAROSCOPY     X 2 ENDOMETRIOSIS.   LEG SURGERY  04/2014   right knee - tumor - benighn   ROBOTIC ASSISTED LAPAROSCOPIC LYSIS OF ADHESION N/A 11/11/2014   Procedure: ROBOTIC ASSISTED LAPAROSCOPIC LYSIS OF ADHESION;  Surgeon: Noland Fordyce, MD;  Location: [facility];  Service: Gynecology;  Laterality: N/A;  ROBOTIC ASSISTED TOTAL HYSTERECTOMY WITH BILATERAL SALPINGO OOPHERECTOMY Bilateral 11/11/2014   Procedure: ROBOTIC ASSISTED TOTAL HYSTERECTOMY WITH BILATERAL SALPINGO OOPHORECTOMY;  Surgeon: Noland Fordyce, MD;  Location: [facility];  Service: Gynecology;  Laterality: Bilateral;   TOOTH EXTRACTION      There were no vitals filed for this visit.   Subjective Assessment - 12/25/22 1649     Currently in Pain? No/denies    Pain Score 0-No pain                  Group Session:  S: Doing better today I believe.   O: In this group therapy session, the objective was to explore the multifaceted concept of purpose. The participants engaged in a deep exploration of the innate  human impulse for purposeful engagement and the impact of purpose on mental and emotional well-being. The discussion centered around the challenges of modern life, including feelings of isolation despite technological advancements, the connection between purposelessness and depression/anxiety, and the importance of occupational roles in achieving fulfillment. Strategies were shared to identify areas of life where purpose is lacking and to develop immediate and actionable plans to overcome these challenges. The participants gained valuable insights into the intersection of purpose and mental health, as well as practical tools to navigate their personal journeys towards purpose-driven lives. Overall, the session fostered a sense of empowerment and inspired the participants to take proactive steps towards aligning their lives with their true purpose.   A: Patient demonstrated a high level of engagement and active participation. They actively contributed to the discussions, sharing personal insights and experiences related to the concept of purpose. Patient demonstrated a clear understanding of the relationship between purpose and mental well-being, acknowledging the importance of aligning actions with values and setting meaningful goals. They actively engaged in self-reflection exercises and expressed a commitment to incorporating strategies discussed in the session into their daily life. Patient exhibited a strong motivation to cultivate purpose and showed a willingness to take proactive steps towards living a more purposeful and fulfilling life. Overall, Patient's active participation and enthusiasm contributed to a positive and collaborative therapeutic environment.   P: Continue to attend PHP OT group sessions 5x week for 4 weeks to promote daily structure, social engagement, and opportunities to develop and utilize adaptive strategies to maximize functional performance in preparation for safe transition and  integration back into school, work, and the community. Plan to address topic of SMART Goals in next OT group session.                 OT Education - 12/25/22 1649     Education Details Discovering Purpose 2              OT Short Term Goals - 12/17/22 1827       OT SHORT TERM GOAL #1   Title Client will develop and utilize a personalized coping toolbox containing at least five coping strategies to manage challenging situations, demonstrating their use in real-life scenarios by the end of therapy.    Time 4    Period Weeks    Status On-going    Target Date 01/06/23      OT SHORT TERM GOAL #2   Title Client will independently identify and modify three areas of the current routine that contribute to increased stress or dysfunction by the end of therapy.    Time 4    Period Weeks    Status On-going    Target Date 01/06/23  OT SHORT TERM GOAL #3   Title By the time of discharge, client will independently set, track, and make progress towards a long-term goal, demonstrating resilience in overcoming obstacles and seeking support when needed.    Status On-going                      Plan - 12/25/22 1650     Psychosocial Skills Coping Strategies;Interpersonal Interaction;Routines and Behaviors;Habits             Patient will benefit from skilled therapeutic intervention in order to improve the following deficits and impairments:       Psychosocial Skills: Coping Strategies, Interpersonal Interaction, Routines and Behaviors, Habits   Visit Diagnosis: Difficulty coping    Problem List Patient Active Problem List   Diagnosis Date Noted   MDD (major depressive disorder), recurrent severe, without psychosis (HCC) 11/29/2022   Anxiety state 04/01/2022   Migraine with aura and without status migrainosus, not intractable 12/19/2021   Cubital tunnel syndrome of both upper extremities 11/03/2021   Polyarthralgia 09/27/2021   Iron deficiency  anemia 09/27/2021   Mixed hyperlipidemia 09/27/2021   Surgical menopause 08/06/2016   Vitamin D deficiency 08/06/2016   Anxiety and depression 09/29/2015   History of DVT (deep vein thrombosis) 09/29/2015   Fibromyalgia 08/01/2015   Insomnia 08/01/2015   IC (interstitial cystitis) 03/21/2012   Polycystic kidney disease 10/09/2011    Ted Mcalpine, OT 12/25/2022, 4:50 PM  Kerrin Champagne, OT   [facility] HOSPITALIZATION PROGRAM [address] [place], [place], [address] Phone: [phone]   Fax:  [phone]  Name: SMT. LODER MRN: [id number] Date of Birth: [birth year]

## 2022-12-26 ENCOUNTER — Other Ambulatory Visit (HOSPITAL_COMMUNITY): Payer: Medicare HMO

## 2022-12-26 ENCOUNTER — Other Ambulatory Visit (HOSPITAL_COMMUNITY): Payer: Medicare HMO | Admitting: Licensed Clinical Social Worker

## 2022-12-26 DIAGNOSIS — F411 Generalized anxiety disorder: Secondary | ICD-10-CM | POA: Diagnosis not present

## 2022-12-26 DIAGNOSIS — R4589 Other symptoms and signs involving emotional state: Secondary | ICD-10-CM | POA: Diagnosis not present

## 2022-12-26 DIAGNOSIS — F332 Major depressive disorder, recurrent severe without psychotic features: Secondary | ICD-10-CM | POA: Diagnosis not present

## 2022-12-26 DIAGNOSIS — Z79899 Other long term (current) drug therapy: Secondary | ICD-10-CM | POA: Diagnosis not present

## 2022-12-26 DIAGNOSIS — G4700 Insomnia, unspecified: Secondary | ICD-10-CM | POA: Diagnosis not present

## 2022-12-26 DIAGNOSIS — Z87891 Personal history of nicotine dependence: Secondary | ICD-10-CM | POA: Diagnosis not present
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
## 2022-12-26 NOTE — Psych (Signed)
Virtual Visit via Video Note  I connected with Sharon Bowman on 12/26/22 at  9:00 AM EDT by a video enabled telemedicine application and verified that I am speaking with the correct person using two identifiers.  Location: Patient: pt's home in [place], [place] Provider: clinical home office in [place], [place]   I discussed the limitations of evaluation and management by telemedicine and the availability of in person appointments. The patient expressed understanding and agreed to proceed.  I discussed the assessment and treatment plan with the patient. The patient was provided an opportunity to ask questions and all were answered. The patient agreed with the plan and demonstrated an understanding of the instructions.   The patient was advised to call back or seek an in-person evaluation if the symptoms worsen or if the condition fails to improve as anticipated.  I provided 240 minutes of non-face-to-face time during this encounter.   Sharon Lora, LCSW   [facility] PHP THERAPIST PROGRESS NOTE  Sharon Bowman [id number]   Session Time: 9:00 am - 10:00 am  Participation Level: Active  Behavioral Response: CasualAlertDepressed  Type of Therapy: Group Therapy  Treatment Goals addressed: Coping  Progress Towards Goals: Progressing  Interventions: CBT, DBT, Solution Focused, Strength-based, Supportive, and Reframing  Therapist Response: Clinician led check-in regarding current stressors and situation, and review of patient completed daily inventory. Clinician utilized active listening and empathetic response and validated patient emotions. Clinician facilitated processing group on pertinent issues.?   Summary: Patient arrived within time allowed. Patient rates her mood at a 8 on a scale of 1-10 with 10 being best. Pt reported, "I'm home and I got to sleep in my own bed last night. Hopefully being home and the medicine." She reports her appetite was good yesterday, as her mom is  still cooking her meals. She reports her appointment went well yesterday and she really likes the therapist she saw. She denies SI/SH thoughts. She denies a topic for processing.  Pt able to process.?Pt engaged in discussion.?      Session Time: 10:00 am - 11:00 am  Participation Level: Active  Behavioral Response: CasualAlertDepressed  Type of Therapy: Group Therapy  Treatment Goals addressed: Coping  Progress Towards Goals: Progressing  Interventions: CBT, DBT, Solution Focused, Strength-based, Supportive, and Reframing  Therapist Response: Clinician led group on coping with grief utilizing three theoretical frameworks; the five stages of grief Sharon Bowman), growing around the grief Sharon Bowman), and the dual process model of grief (Sharon Bowman).  Summary: Pt engaged in discussion. She shares that she lost her father on Christmas Day in 2019 and details how this has impacted her and her family. She is receptive to feedback.    Session Time: 11:00 am - 12:00 pm  Participation Level: Active  Behavioral Response: CasualAlertDepressed  Type of Therapy: Group Therapy  Treatment Goals addressed: Coping  Progress Towards Goals: Progressing  Interventions: CBT, DBT, Solution Focused, Strength-based, Supportive, and Reframing  Therapist Response: Group viewed Sharon Bowman entitled "How To Not Take Things Personally" presented by Sharon Bowman. Cln utilized CBT principles to inform discussion while pts were encouraged to share their response to the video. Cln asked each patient to share a time in which they took things personally and reframed the situation based on what was shared in the [place] Bowman.   Summary: Pt engaged in discussion. She demonstrates good insight into subject matter.   Session Time: 12:00 pm - 1:00 pm  Participation Level: Active  Behavioral Response: CasualAlertDepressed  Type of Therapy: Group Therapy  Treatment Goals addressed: Coping  Progress Towards  Goals: Progressing  Interventions: CBT, DBT, Solution Focused, Strength-based, Supportive, and Reframing  Therapist Response: 12:00 - 12:50 pm: See OT note. 12:50 - 1:00 pm: Clinician led check-out. Clinician assessed for immediate needs, medication compliance and efficacy, and safety concerns?  Summary: 12:00 - 12:50 pm: See OT note 12:50 - 1:00 pm: At check-out, patient contracts for safety.?Patient demonstrates progress as evidenced by her continued engagement and by being receptive to treatment. Patient denies SI/HI/self-harm thoughts at the end of group and agrees to seek help should those thoughts/feelings occur.?   Suicidal/Homicidal: Nowithout intent/plan  Plan: ?Pt will continue in PHP and medication management while continuing to work on decreasing depression symptoms,?SI, and anxiety symptoms,?and increasing the ability to self manage symptoms.    Collaboration of Care: Medication Management AEB Sharon Jacks, NP  Patient/Guardian was advised Release of Information must be obtained prior to any record release in order to collaborate their care with an outside provider. Patient/Guardian was advised if they have not already done so to contact the registration department to sign all necessary forms in order for [place] to release information regarding their care.   Consent: Patient/Guardian gives verbal consent for treatment and assignment of benefits for services provided during this visit. Patient/Guardian expressed understanding and agreed to proceed.   Diagnosis: MDD (major depressive disorder), recurrent severe, without psychosis (HCC) [F33.2]    1. MDD (major depressive disorder), recurrent severe, without psychosis (HCC)       Sharon Lora, LCSW 12/26/2022

## 2022-12-27 ENCOUNTER — Other Ambulatory Visit (HOSPITAL_COMMUNITY): Payer: Medicare HMO | Admitting: Licensed Clinical Social Worker

## 2022-12-27 ENCOUNTER — Other Ambulatory Visit (HOSPITAL_COMMUNITY): Payer: Medicare HMO

## 2022-12-27 DIAGNOSIS — F411 Generalized anxiety disorder: Secondary | ICD-10-CM

## 2022-12-27 DIAGNOSIS — G4700 Insomnia, unspecified: Secondary | ICD-10-CM | POA: Diagnosis not present

## 2022-12-27 DIAGNOSIS — R4589 Other symptoms and signs involving emotional state: Secondary | ICD-10-CM

## 2022-12-27 DIAGNOSIS — Z79899 Other long term (current) drug therapy: Secondary | ICD-10-CM | POA: Diagnosis not present

## 2022-12-27 DIAGNOSIS — Z87891 Personal history of nicotine dependence: Secondary | ICD-10-CM | POA: Diagnosis not present

## 2022-12-27 DIAGNOSIS — F332 Major depressive disorder, recurrent severe without psychotic features: Secondary | ICD-10-CM

## 2022-12-27 NOTE — Psych (Signed)
Virtual Visit via Video Note  I connected with Sharon Bowman on 12/21/22 at  9:00 AM EDT by a video enabled telemedicine application and verified that I am speaking with the correct person using two identifiers.  Location: Patient: patient home Provider: clinical home office   I discussed the limitations of evaluation and management by telemedicine and the availability of in person appointments. The patient expressed understanding and agreed to proceed.  Follow Up Instructions:    I discussed the assessment and treatment plan with the patient. The patient was provided an opportunity to ask questions and all were answered. The patient agreed with the plan and demonstrated an understanding of the instructions.   The patient was advised to call back or seek an in-person evaluation if the symptoms worsen or if the condition fails to improve as anticipated.  I provided 240 minutes of non-face-to-face time during this encounter.   [name], [facility]   [facility] PHP THERAPIST PROGRESS NOTE  Sharon Bowman [id number]  Session Time: 9-10  Participation Level: Active  Behavioral Response: CasualAlertDepressed  Type of Therapy: Group Therapy  Treatment Goals addressed: Coping  Progress Towards Goals: Progressing  Interventions: CBT, DBT, Solution Focused, Strength-based, Supportive, and Reframing  Summary: Sharon Bowman is a 47 y.o. female who presents with depression and anxiety symptoms. Clinician led check-in regarding current stressors and situation, and review of patient completed daily inventory. Clinician utilized active listening and empathetic response and validated patient emotions. Clinician facilitated processing group on pertinent issues.?    Therapist Response: Patient arrived within time allowed. Patient rates her mood at an 8 on a scale of 1-10 with 10 being best. Pt states she feels "better than any other day this week." Pt states she slept 6 hours and ate  3x yesterday. Pt rates her depression as a 7 and anxiety as an 7, on a scale of 1-10 with 10 being high. Pt reports she met with her new psychiatrist yesterday and feels good about it. She took a bath with Epson salts. She spent time with her sister and niece and is happy about the support they are providing. Patient able to process. Patient engaged in discussion.            Session Time: 10:00 am - 11:00 am   Participation Level: Active   Behavioral Response: CasualAlertAnxious and Depressed   Type of Therapy: Group Therapy   Treatment Goals addressed: Coping   Progress Towards Goals: Progressing   Interventions: CBT, DBT, Solution Focused, Strength-based, Supportive, and Reframing   Therapist Response: Clinician led introduced the topic of "Radical Acceptance". Patients identified and discussed areas of life where radical acceptance would be useful.    Therapist Response:  Pt engaged in discussion. Pt reports radical acceptance can help with staying with her mom right now.         Session Time: 11:00 -12:00   Participation Level: Active   Behavioral Response: CasualAlertDepressed   Type of Therapy: Group Therapy   Treatment Goals addressed: Coping   Progress Towards Goals: Progressing   Interventions: CBT, DBT, Solution Focused, Strength-based, Supportive, and Reframing   Summary: Clinician introduced "Mindfulness". The group discussed "What" and "How" skills of mindfulness.   Therapist Response:  Pt engaged in discussion. Pt reports she will try to be mindful while driving to have timed practice within her every day life.          Session Time: 12:00 pm - 1:00 pm   Participation Level:  Active   Behavioral Response: CasualAlertAnxious and Depressed   Type of Therapy: Group Therapy   Treatment Goals addressed: Coping   Progress Towards Goals: Progressing   Interventions: CBT, DBT, Solution Focused, Strength-based, Supportive, and Reframing   Therapist  Response: 12:00 - 12:50 pm: OT group led by cln E. Hollan 12:50 - 1:00 pm: Clinician led check-out. Clinician assessed for immediate needs, medication compliance and efficacy, and safety concerns?   Summary: 12:00 - 12:50 pm: Pt participated 12:50 - 1:00 pm: At check-out, patient reports no immediate concerns.??Patient demonstrates progress as evidenced by continued engagement and responsiveness to treatment. Patient denies SI/HI/self-harm thoughts at the end of group.   Suicidal/Homicidal: Nowithout intent/plan  Plan: Pt will continue in PHP while working to decrease depression and anxiety symptoms, increase emotion regulation, and increase ability to manage symptoms in a healthy manner.   Collaboration of Care: Psychiatrist AEB Eliseo Gum  Patient/Guardian was advised Release of Information must be obtained prior to any record release in order to collaborate their care with an outside provider. Patient/Guardian was advised if they have not already done so to contact the registration department to sign all necessary forms in order for [place] to release information regarding their care.   Consent: Patient/Guardian gives verbal consent for treatment and assignment of benefits for services provided during this visit. Patient/Guardian expressed understanding and agreed to proceed.   Diagnosis: MDD (major depressive disorder), recurrent severe, without psychosis (HCC) [F33.2]    1. MDD (major depressive disorder), recurrent severe, without psychosis (HCC)       [name], [facility] 12/21/22

## 2022-12-28 ENCOUNTER — Encounter (HOSPITAL_COMMUNITY): Payer: Self-pay

## 2022-12-28 ENCOUNTER — Other Ambulatory Visit (HOSPITAL_COMMUNITY): Payer: Medicare HMO | Admitting: Licensed Clinical Social Worker

## 2022-12-28 ENCOUNTER — Other Ambulatory Visit (HOSPITAL_COMMUNITY): Payer: Medicare HMO

## 2022-12-28 DIAGNOSIS — R4589 Other symptoms and signs involving emotional state: Secondary | ICD-10-CM | POA: Diagnosis not present

## 2022-12-28 DIAGNOSIS — F411 Generalized anxiety disorder: Secondary | ICD-10-CM | POA: Diagnosis not present

## 2022-12-28 DIAGNOSIS — F332 Major depressive disorder, recurrent severe without psychotic features: Secondary | ICD-10-CM | POA: Diagnosis not present

## 2022-12-28 DIAGNOSIS — Z79899 Other long term (current) drug therapy: Secondary | ICD-10-CM | POA: Diagnosis not present

## 2022-12-28 DIAGNOSIS — G4700 Insomnia, unspecified: Secondary | ICD-10-CM | POA: Diagnosis not present

## 2022-12-28 DIAGNOSIS — Z87891 Personal history of nicotine dependence: Secondary | ICD-10-CM | POA: Diagnosis not present

## 2022-12-28 NOTE — Therapy (Signed)
[facility] PARTIAL HOSPITALIZATION PROGRAM [address] [place], [place], [address] Phone: [phone]   Fax:  [phone]  Occupational Therapy Treatment Virtual Visit via Video Note  I connected with [name] on 12/28/22 at  8:00 AM EDT by a video enabled telemedicine application and verified that I am speaking with the correct person using two identifiers.  Location: Patient: home Provider: office   I discussed the limitations of evaluation and management by telemedicine and the availability of in person appointments. The patient expressed understanding and agreed to proceed.    The patient was advised to call back or seek an in-person evaluation if the symptoms worsen or if the condition fails to improve as anticipated.  I provided 55 minutes of non-face-to-face time during this encounter.   Patient Details  Name: Sharon Bowman MRN: [id number] Date of Birth: 1976-03-20 No data recorded  Encounter Date: 12/27/2022   OT End of Session - 12/28/22 0044     Visit Number 8    Number of Visits 20    Date for OT Re-Evaluation 01/06/23    OT Start Time 1200    OT Stop Time 1255    OT Time Calculation (min) 55 min             Past Medical History:  Diagnosis Date   Allergic asthma 08/2015   Anemia    Anxiety    Arthritis    osetoarthritis   Chronic sinusitis    Depression    DVT (deep venous thrombosis) (HCC) 04/2014   right leg - behind calf, no treated with aspirin daily   DVT (deep venous thrombosis) (HCC)    Pt states hx DVT in R knee and R upper thigh in 2015   Endometriosis    Endometriosis    Family history of premature CAD    Fibromyalgia 2017   Former smoker    GAD (generalized anxiety disorder)    Headache    History of PFTs 08/2015   normal   Interstitial cystitis    Interstitial cystitis    PCOS (polycystic ovarian syndrome)    Polycystic kidney disease     Past Surgical History:  Procedure Laterality  Date   ABDOMINAL HYSTERECTOMY     BLADDER SURGERY     BLADDER STRETCH X 3   carpel radial tunnel  2014   carpel tunnel     left  and right hand    CYSTO WITH HYDRODISTENSION N/A 11/11/2014   Procedure: CYSTOSCOPY/HYDRODISTENSION MARCAINE AND PYRIDIUM AND Doreatha Lew;  Surgeon: Jethro Bolus, MD;  Location: [facility];  Service: Urology;  Laterality: N/A;   CYSTOSCOPY N/A 11/11/2014   Procedure: CYSTOSCOPY;  Surgeon: Noland Fordyce, MD;  Location: [facility];  Service: Gynecology;  Laterality: N/A;   CYSTOSCOPY WITH RETROGRADE PYELOGRAM, URETEROSCOPY AND STENT PLACEMENT Bilateral 11/11/2014   Procedure:  RETROGRADE PYELOGRAM with BILATERAL URETERAL CATHETHERS;  Surgeon: Jethro Bolus, MD;  Location: [facility];  Service: Urology;  Laterality: Bilateral;   IUD REMOVAL N/A 11/11/2014   Procedure: INTRAUTERINE DEVICE (IUD) REMOVAL;  Surgeon: Noland Fordyce, MD;  Location: [facility];  Service: Gynecology;  Laterality: N/A;   LAPAROSCOPY     X 2 ENDOMETRIOSIS.   LEG SURGERY  04/2014   right knee - tumor - benighn   ROBOTIC ASSISTED LAPAROSCOPIC LYSIS OF ADHESION N/A 11/11/2014   Procedure: ROBOTIC ASSISTED LAPAROSCOPIC LYSIS OF ADHESION;  Surgeon: Noland Fordyce, MD;  Location: [facility];  Service: Gynecology;  Laterality: N/A;  ROBOTIC ASSISTED TOTAL HYSTERECTOMY WITH BILATERAL SALPINGO OOPHERECTOMY Bilateral 11/11/2014   Procedure: ROBOTIC ASSISTED TOTAL HYSTERECTOMY WITH BILATERAL SALPINGO OOPHORECTOMY;  Surgeon: Noland Fordyce, MD;  Location: [facility];  Service: Gynecology;  Laterality: Bilateral;   TOOTH EXTRACTION      There were no vitals filed for this visit.   Subjective Assessment - 12/28/22 0044     Currently in Pain? No/denies    Pain Score 0-No pain               Group Session:  S: Feeling better today overall.   O: During today's OT group session, the patient participated in an educational segment about the importance of goal-setting and the application of the SMART framework  to enhance daily life, particularly focusing on ADLs and iADLs. The session began with five open-ended pre-session questions that facilitated group discussion and introspection about their current relationship with goals. Following the introduction and educational segment, participants engaged in brainstorming and group discussions to devise hypothetical SMART goals. The session concluded with five post-session questions to reinforce understanding and facilitate reflection. Throughout the session, there was a range of engagement levels noted among the participants.   A:  Patient demonstrated a high level of engagement throughout the session. They actively participated in discussions, sharing personal experiences related to goal setting and challenges faced. Patient was able to clearly articulate an understanding of the SMART framework and proposed personal SMART goals related to their own ADLs with minimal assistance. They expressed enthusiasm about applying what they learned to their daily routine and appeared motivated to make changes.    P: Continue to attend PHP OT group sessions 5x week for 4 weeks to promote daily structure, social engagement, and opportunities to develop and utilize adaptive strategies to maximize functional performance in preparation for safe transition and integration back into school, work, and the community. Plan to address topic of tbd in next OT group session.                    OT Education - 12/28/22 0044     Education Details SMART Goals 2              OT Short Term Goals - 12/17/22 1827       OT SHORT TERM GOAL #1   Title Client will develop and utilize a personalized coping toolbox containing at least five coping strategies to manage challenging situations, demonstrating their use in real-life scenarios by the end of therapy.    Time 4    Period Weeks    Status On-going    Target Date 01/06/23      OT SHORT TERM GOAL #2   Title Client  will independently identify and modify three areas of the current routine that contribute to increased stress or dysfunction by the end of therapy.    Time 4    Period Weeks    Status On-going    Target Date 01/06/23      OT SHORT TERM GOAL #3   Title By the time of discharge, client will independently set, track, and make progress towards a long-term goal, demonstrating resilience in overcoming obstacles and seeking support when needed.    Status On-going                      Plan - 12/28/22 0044     Psychosocial Skills Coping Strategies;Interpersonal Interaction;Routines and Behaviors;Habits  Patient will benefit from skilled therapeutic intervention in order to improve the following deficits and impairments:       Psychosocial Skills: Coping Strategies, Interpersonal Interaction, Routines and Behaviors, Habits   Visit Diagnosis: Difficulty coping    Problem List Patient Active Problem List   Diagnosis Date Noted   MDD (major depressive disorder), recurrent severe, without psychosis (HCC) 11/29/2022   Anxiety state 04/01/2022   Migraine with aura and without status migrainosus, not intractable 12/19/2021   Cubital tunnel syndrome of both upper extremities 11/03/2021   Polyarthralgia 09/27/2021   Iron deficiency anemia 09/27/2021   Mixed hyperlipidemia 09/27/2021   Surgical menopause 08/06/2016   Vitamin D deficiency 08/06/2016   Anxiety and depression 09/29/2015   History of DVT (deep vein thrombosis) 09/29/2015   Fibromyalgia 08/01/2015   Insomnia 08/01/2015   IC (interstitial cystitis) 03/21/2012   Polycystic kidney disease 10/09/2011    Ted Mcalpine, OT 12/28/2022, 12:45 AM  Kerrin Champagne, OT   [facility] HOSPITALIZATION PROGRAM [address] [place], [place], [address] Phone: [phone]   Fax:  [phone]  Name: Sharon Bowman MRN: [id number] Date of Birth: 1976-04-15

## 2022-12-28 NOTE — Therapy (Signed)
[facility] PARTIAL HOSPITALIZATION PROGRAM [address] [place], [place], [address] Phone: [phone]   Fax:  [phone]  Occupational Therapy Treatment Virtual Visit via Video Note  I connected with Sharon Bowman on 12/28/22 at  8:00 AM EDT by a video enabled telemedicine application and verified that I am speaking with the correct person using two identifiers.  Location: Patient: home Provider: office   I discussed the limitations of evaluation and management by telemedicine and the availability of in person appointments. The patient expressed understanding and agreed to proceed.    The patient was advised to call back or seek an in-person evaluation if the symptoms worsen or if the condition fails to improve as anticipated.  I provided 55 minutes of non-face-to-face time during this encounter.   Patient Details  Name: Sharon Bowman MRN: [id number] Date of Birth: 02-Mar-1976 No data recorded  Encounter Date: 12/26/2022   OT End of Session - 12/28/22 0039     Visit Number 7    Number of Visits 20    Date for OT Re-Evaluation 01/06/23    OT Start Time 1200    OT Stop Time 1255    OT Time Calculation (min) 55 min             Past Medical History:  Diagnosis Date   Allergic asthma 08/2015   Anemia    Anxiety    Arthritis    osetoarthritis   Chronic sinusitis    Depression    DVT (deep venous thrombosis) (HCC) 04/2014   right leg - behind calf, no treated with aspirin daily   DVT (deep venous thrombosis) (HCC)    Pt states hx DVT in R knee and R upper thigh in 2015   Endometriosis    Endometriosis    Family history of premature CAD    Fibromyalgia 2017   Former smoker    GAD (generalized anxiety disorder)    Headache    History of PFTs 08/2015   normal   Interstitial cystitis    Interstitial cystitis    PCOS (polycystic ovarian syndrome)    Polycystic kidney disease     Past Surgical History:  Procedure Laterality  Date   ABDOMINAL HYSTERECTOMY     BLADDER SURGERY     BLADDER STRETCH X 3   carpel radial tunnel  2014   carpel tunnel     left  and right hand    CYSTO WITH HYDRODISTENSION N/A 11/11/2014   Procedure: CYSTOSCOPY/HYDRODISTENSION MARCAINE AND PYRIDIUM AND Doreatha Lew;  Surgeon: Jethro Bolus, MD;  Location: [facility];  Service: Urology;  Laterality: N/A;   CYSTOSCOPY N/A 11/11/2014   Procedure: CYSTOSCOPY;  Surgeon: Noland Fordyce, MD;  Location: [facility];  Service: Gynecology;  Laterality: N/A;   CYSTOSCOPY WITH RETROGRADE PYELOGRAM, URETEROSCOPY AND STENT PLACEMENT Bilateral 11/11/2014   Procedure:  RETROGRADE PYELOGRAM with BILATERAL URETERAL CATHETHERS;  Surgeon: Jethro Bolus, MD;  Location: [facility];  Service: Urology;  Laterality: Bilateral;   IUD REMOVAL N/A 11/11/2014   Procedure: INTRAUTERINE DEVICE (IUD) REMOVAL;  Surgeon: Noland Fordyce, MD;  Location: [facility];  Service: Gynecology;  Laterality: N/A;   LAPAROSCOPY     X 2 ENDOMETRIOSIS.   LEG SURGERY  04/2014   right knee - tumor - benighn   ROBOTIC ASSISTED LAPAROSCOPIC LYSIS OF ADHESION N/A 11/11/2014   Procedure: ROBOTIC ASSISTED LAPAROSCOPIC LYSIS OF ADHESION;  Surgeon: Noland Fordyce, MD;  Location: [facility];  Service: Gynecology;  Laterality: N/A;  ROBOTIC ASSISTED TOTAL HYSTERECTOMY WITH BILATERAL SALPINGO OOPHERECTOMY Bilateral 11/11/2014   Procedure: ROBOTIC ASSISTED TOTAL HYSTERECTOMY WITH BILATERAL SALPINGO OOPHORECTOMY;  Surgeon: Noland Fordyce, MD;  Location: [facility];  Service: Gynecology;  Laterality: Bilateral;   TOOTH EXTRACTION      There were no vitals filed for this visit.   Subjective Assessment - 12/28/22 0038     Currently in Pain? No/denies    Pain Score 0-No pain    Multiple Pain Sites No                 Group Session:  S: Doing better today I believe.   O: During today's OT group session, the patient participated in an educational segment about the importance of goal-setting and the  application of the SMART framework to enhance daily life, particularly focusing on ADLs and iADLs. The session began with five open-ended pre-session questions that facilitated group discussion and introspection about their current relationship with goals. Following the introduction and educational segment, participants engaged in brainstorming and group discussions to devise hypothetical SMART goals. The session concluded with five post-session questions to reinforce understanding and facilitate reflection. Throughout the session, there was a range of engagement levels noted among the participants.   A:  Patient demonstrated a high level of engagement throughout the session. They actively participated in discussions, sharing personal experiences related to goal setting and challenges faced. Patient was able to clearly articulate an understanding of the SMART framework and proposed personal SMART goals related to their own ADLs with minimal assistance. They expressed enthusiasm about applying what they learned to their daily routine and appeared motivated to make changes.    P: Continue to attend PHP OT group sessions 5x week for 4 weeks to promote daily structure, social engagement, and opportunities to develop and utilize adaptive strategies to maximize functional performance in preparation for safe transition and integration back into school, work, and the community. Plan to address topic of SMART Goals 2 in next OT group session.                  OT Education - 12/28/22 0039     Education Details SMART Goals              OT Short Term Goals - 12/17/22 1827       OT SHORT TERM GOAL #1   Title Client will develop and utilize a personalized coping toolbox containing at least five coping strategies to manage challenging situations, demonstrating their use in real-life scenarios by the end of therapy.    Time 4    Period Weeks    Status On-going    Target Date 01/06/23       OT SHORT TERM GOAL #2   Title Client will independently identify and modify three areas of the current routine that contribute to increased stress or dysfunction by the end of therapy.    Time 4    Period Weeks    Status On-going    Target Date 01/06/23      OT SHORT TERM GOAL #3   Title By the time of discharge, client will independently set, track, and make progress towards a long-term goal, demonstrating resilience in overcoming obstacles and seeking support when needed.    Status On-going                      Plan - 12/28/22 0039     Psychosocial Skills Coping Strategies;Interpersonal Interaction;Routines and Behaviors;Habits  Patient will benefit from skilled therapeutic intervention in order to improve the following deficits and impairments:       Psychosocial Skills: Coping Strategies, Interpersonal Interaction, Routines and Behaviors, Habits   Visit Diagnosis: Difficulty coping    Problem List Patient Active Problem List   Diagnosis Date Noted   MDD (major depressive disorder), recurrent severe, without psychosis (HCC) 11/29/2022   Anxiety state 04/01/2022   Migraine with aura and without status migrainosus, not intractable 12/19/2021   Cubital tunnel syndrome of both upper extremities 11/03/2021   Polyarthralgia 09/27/2021   Iron deficiency anemia 09/27/2021   Mixed hyperlipidemia 09/27/2021   Surgical menopause 08/06/2016   Vitamin D deficiency 08/06/2016   Anxiety and depression 09/29/2015   History of DVT (deep vein thrombosis) 09/29/2015   Fibromyalgia 08/01/2015   Insomnia 08/01/2015   IC (interstitial cystitis) 03/21/2012   Polycystic kidney disease 10/09/2011    Ted Mcalpine, OT 12/28/2022, 12:40 AM  Kerrin Champagne, OT   [facility] HOSPITALIZATION PROGRAM [address] [place], [place], [address] Phone: [phone]   Fax:  [phone]  Name: Sharon Bowman MRN:  [id number] Date of Birth: [birth year]

## 2022-12-31 ENCOUNTER — Other Ambulatory Visit (HOSPITAL_COMMUNITY): Payer: Medicare HMO

## 2022-12-31 ENCOUNTER — Other Ambulatory Visit (HOSPITAL_COMMUNITY): Payer: Medicare HMO | Attending: Psychiatry | Admitting: Licensed Clinical Social Worker

## 2022-12-31 DIAGNOSIS — M797 Fibromyalgia: Secondary | ICD-10-CM | POA: Insufficient documentation

## 2022-12-31 DIAGNOSIS — Z79899 Other long term (current) drug therapy: Secondary | ICD-10-CM | POA: Diagnosis not present

## 2022-12-31 DIAGNOSIS — N3010 Interstitial cystitis (chronic) without hematuria: Secondary | ICD-10-CM | POA: Diagnosis not present

## 2022-12-31 DIAGNOSIS — B9689 Other specified bacterial agents as the cause of diseases classified elsewhere: Secondary | ICD-10-CM | POA: Insufficient documentation

## 2022-12-31 DIAGNOSIS — F332 Major depressive disorder, recurrent severe without psychotic features: Secondary | ICD-10-CM | POA: Diagnosis not present

## 2022-12-31 DIAGNOSIS — G8929 Other chronic pain: Secondary | ICD-10-CM | POA: Diagnosis not present

## 2022-12-31 DIAGNOSIS — F411 Generalized anxiety disorder: Secondary | ICD-10-CM

## 2022-12-31 NOTE — Psych (Signed)
Virtual Visit via Video Note  I connected with Sharon Bowman on 12/28/22 at  9:00 AM EDT by a video enabled telemedicine application and verified that I am speaking with the correct person using two identifiers.  Location: Patient: patient's home Provider: clinical home office   I discussed the limitations of evaluation and management by telemedicine and the availability of in person appointments. The patient expressed understanding and agreed to proceed.  I discussed the assessment and treatment plan with the patient. The patient was provided an opportunity to ask questions and all were answered. The patient agreed with the plan and demonstrated an understanding of the instructions.   The patient was advised to call back or seek an in-person evaluation if the symptoms worsen or if the condition fails to improve as anticipated.  Pt was provided 240 minutes of non-face-to-face time during this encounter.   Donia Guiles, LCSW   [facility] PHP THERAPIST PROGRESS NOTE  Sharon Bowman [id number]  Session Time: 9:00 - 10:00  Participation Level: Active  Behavioral Response: CasualAlertDepressed  Type of Therapy: Group Therapy  Treatment Goals addressed: Coping  Progress Towards Goals: Progressing  Interventions: CBT, DBT, Supportive, and Reframing  Summary: Sharon Bowman is a 47 y.o. female who presents with depression and anxiety symptoms.  Clinician led check-in regarding current stressors and situation, and review of patient completed daily inventory. Clinician utilized active listening and empathetic response and validated patient emotions. Clinician facilitated processing group on pertinent issues.?    Therapist Response:  Patient arrived within time allowed. Patient rates her mood at a 9 on a scale of 1-10 with 10 being best. Pt states she feels "really good." Pt states she slept 9 hours and ate 3x. Pt reports she is proud of her continued productivity. Pt shares it has  been a long time since she has been able to consistently complete tasks. Pt reports anxiety re: court date on 7/12 and wondering whether she will have to face her ex. Pt agrees to ask her sisters to go with her for support. Patient able to process. Patient engaged in discussion.            Session Time: 10:00 am - 11:00 am   Participation Level: Active   Behavioral Response: CasualAlertDepressed   Type of Therapy: Group Therapy   Treatment Goals addressed: Coping   Progress Towards Goals: Progressing   Interventions: CBT, DBT, Solution Focused, Strength-based, Supportive, and Reframing   Therapist Response: Cln introduced gratitudes as a way to increase positive mindset. Cln utilized positive psychology and CBT to inform discussion. Cln tasked pt's to write down 3 different gratitudes daily for 3 weeks and discussed how by doing so we can retrain our brain to scan for the positive rather than the negative.    Therapist Response:  Pt engaged in discussion and is willing to utilize the practice.         Session Time: 11:00 -12:00   Participation Level: Active   Behavioral Response: CasualAlertDepressed   Type of Therapy: Group Therapy   Treatment Goals addressed: Coping   Progress Towards Goals: Progressing   Interventions: CBT, DBT, Solution Focused, Strength-based, Supportive, and Reframing   Summary: Cln led discussion on change. Group members shared ways in which they struggle with change and what makes it difficult. Cln brought in DBT radical acceptance and CBT thought challenging as a way to manage issues with change.    Therapist Response:  Pt engaged in discussion  Session Time: 12:00 -1:00   Participation Level: Active   Behavioral Response: CasualAlertDepressed   Type of Therapy: Group therapy   Treatment Goals addressed: Coping   Progress Towards Goals: Progressing   Interventions: CBT, DBT, Solution Focused, Strength-based, Supportive, and  Reframing   Summary: 12:00 - 12:50: Cln led discussion on valuing ourselves in the same way, or more than we value others. Group viewed TED talk "The Person You Really Need to Marry" to facilitate discussion. Group discussed what it would be like to view themselves as they do a romantic partner and treat themselves with as much care.  12:50 - 1:00 Clinician assessed for immediate needs, medication compliance and efficacy, and safety concerns.   Therapist Response: 12:00 - 12:50: Pt engaged in discussion and is able to share struggles with valuing herself 12:50 - 1:00 pm: At check-out, patient reports no immediate concerns. Patient demonstrates progress as evidenced by continued engagement and responsiveness to treatment. Patient denies SI/HI/self-harm thoughts at the end of group.     Suicidal/Homicidal: Nowithout intent/plan  Plan: Pt will continue in PHP while working to stabilize post-hospitalization, decrease depression and anxiety symptoms, and increase ability to manage symptoms in a healthy manner.   Collaboration of Care: Medication Management [employer]  Patient/Guardian was advised Release of Information must be obtained prior to any record release in order to collaborate their care with an outside provider. Patient/Guardian was advised if they have not already done so to contact the registration department to sign all necessary forms in order for [place] to release information regarding their care.   Consent: Patient/Guardian gives verbal consent for treatment and assignment of benefits for services provided during this visit. Patient/Guardian expressed understanding and agreed to proceed.   Diagnosis: MDD (major depressive disorder), recurrent severe, without psychosis (HCC) [F33.2]    1. MDD (major depressive disorder), recurrent severe, without psychosis (HCC)   2. Anxiety state      Donia Guiles, LCSW

## 2022-12-31 NOTE — Progress Notes (Signed)
Spoke with patient via Teams video call, used 2 identifiers to correctly identify patient. States that groups are going well. Sleep is improved with the help of Seroquel. She is anxious today about getting her house in order. She gets easily stressed with clutter. On scale 1-10 as 10 being worst she rates depression at 4 and anxiety at 7. Denies SI/HI or AV hallucinations.  No issues or complaints.

## 2022-12-31 NOTE — Psych (Signed)
Virtual Visit via Video Note  I connected with Sharon Bowman on 12/18/22 at  9:00 AM EDT by a video enabled telemedicine application and verified that I am speaking with the correct person using two identifiers.  Location: Patient: patient's mother's home Provider: clinical home office   I discussed the limitations of evaluation and management by telemedicine and the availability of in person appointments. The patient expressed understanding and agreed to proceed.  I discussed the assessment and treatment plan with the patient. The patient was provided an opportunity to ask questions and all were answered. The patient agreed with the plan and demonstrated an understanding of the instructions.   The patient was advised to call back or seek an in-person evaluation if the symptoms worsen or if the condition fails to improve as anticipated.  Pt was provided 240 minutes of non-face-to-face time during this encounter.   Donia Guiles, LCSW   [facility] PHP THERAPIST PROGRESS NOTE  Sharon Bowman [id number]  Session Time: 9:00 - 10:00  Participation Level: Active  Behavioral Response: CasualAlertDepressed  Type of Therapy: Group Therapy  Treatment Goals addressed: Coping  Progress Towards Goals: Initial  Interventions: CBT, DBT, Supportive, and Reframing  Summary: Sharon Bowman is a 47 y.o. female who presents with depression and anxiety symptoms.  Clinician led check-in regarding current stressors and situation, and review of patient completed daily inventory. Clinician utilized active listening and empathetic response and validated patient emotions. Clinician facilitated processing group on pertinent issues.?    Therapist Response:  Patient arrived within time allowed. Patient rates her mood at a 4 on a scale of 1-10 with 10 being best. Pt states she feels "not good." Pt states she slept 5 hours broken and ate 3x. Pt reports her "mom is driving me crazy" and she feels  increased negativity when with her mom. Pt reports she is anxious to return to her own home. Pt states feeling "stressed, anxious, and nauseous" most of the day. Pt identifies passive SI and states this is the first time since the hospital that she has experienced those thoughts. Pt denies plan/intent.  Patient able to process. Patient engaged in discussion.            Session Time: 10:00 am - 11:00 am   Participation Level: Active   Behavioral Response: CasualAlertDepressed   Type of Therapy: Group Therapy   Treatment Goals addressed: Coping   Progress Towards Goals: Progressing   Interventions: CBT, DBT, Solution Focused, Strength-based, Supportive, and Reframing   Therapist Response: Cln continued topic of DBT distress tolerance skills. Cln introduced STOP skill and group discussed ways they could apply the STOP skill in their every day life.   Therapist Response:  Pt engaged in discussion and identifies when to apply STOP in their daily life.        Session Time: 11:00 -12:00   Participation Level: Active   Behavioral Response: CasualAlertDepressed   Type of Therapy: Group Therapy   Treatment Goals addressed: Coping   Progress Towards Goals: Progressing   Interventions: CBT, DBT, Solution Focused, Strength-based, Supportive, and Reframing   Summary: Cln continued topic of DBT distress tolerance skills and the ACCEPTS distraction skill. Group reviewed E-P-T-S skills and discussed how they can practice them in their every day life.    Therapist Response:  Pt engaged in discussion and is able to state ways to apply these skills.            Session Time: 12:00 -1:00  Participation Level: Active   Behavioral Response: CasualAlertDepressed   Type of Therapy: Group therapy, Occupational Therapy   Treatment Goals addressed: Coping   Progress Towards Goals: Progressing   Interventions: Supportive; Psychoeducation   Summary: 12:00 - 12:50: Occupational Therapy  group led by cln E. Hollan. 12:50 - 1:00 Clinician assessed for immediate needs, medication compliance and efficacy, and safety concerns.   Therapist Response: 12:00 - 12:50: See OT note 12:50 - 1:00 pm: At check-out, patient reports no immediate concerns. Patient demonstrates progress as evidenced by continued engagement and responsiveness to treatment. Patient denies SI/HI/self-harm thoughts at the end of group.     Suicidal/Homicidal: Nowithout intent/plan  Plan: Pt will continue in PHP while working to stabilize post-hospitalization, decrease depression and anxiety symptoms, and increase ability to manage symptoms in a healthy manner.   Collaboration of Care: Medication Management A[facility] J. McQuilla  Patient/Guardian was advised Release of Information must be obtained prior to any record release in order to collaborate their care with an outside provider. Patient/Guardian was advised if they have not already done so to contact the registration department to sign all necessary forms in order for [place] to release information regarding their care.   Consent: Patient/Guardian gives verbal consent for treatment and assignment of benefits for services provided during this visit. Patient/Guardian expressed understanding and agreed to proceed.   Diagnosis: MDD (major depressive disorder), recurrent severe, without psychosis (HCC) [F33.2]    1. MDD (major depressive disorder), recurrent severe, without psychosis (HCC)   2. Anxiety state      Donia Guiles, LCSW
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
## 2022-12-31 NOTE — Psych (Signed)
Virtual Visit via Video Note  I connected with Sharon Bowman on 12/17/22 at  9:00 AM EDT by a video enabled telemedicine application and verified that I am speaking with the correct person using two identifiers.  Location: Patient: patient's mother's home Provider: clinical home office   I discussed the limitations of evaluation and management by telemedicine and the availability of in person appointments. The patient expressed understanding and agreed to proceed.  I discussed the assessment and treatment plan with the patient. The patient was provided an opportunity to ask questions and all were answered. The patient agreed with the plan and demonstrated an understanding of the instructions.   The patient was advised to call back or seek an in-person evaluation if the symptoms worsen or if the condition fails to improve as anticipated.  Pt was provided 240 minutes of non-face-to-face time during this encounter.   Donia Guiles, LCSW   [facility] PHP THERAPIST PROGRESS NOTE  Sharon Bowman [id number]  Session Time: 9:00 - 10:00  Participation Level: Active  Behavioral Response: CasualAlertDepressed  Type of Therapy: Group Therapy  Treatment Goals addressed: Coping  Progress Towards Goals: Initial  Interventions: CBT, DBT, Supportive, and Reframing  Summary: Sharon Bowman is a 47 y.o. female who presents with depression and anxiety symptoms.  Clinician led check-in regarding current stressors and situation, and review of patient completed daily inventory. Clinician utilized active listening and empathetic response and validated patient emotions. Clinician facilitated processing group on pertinent issues.?    Therapist Response:  Patient arrived within time allowed. Patient rates her mood at a 5 on a scale of 1-10 with 10 being best. Pt states she feels "really sad." Pt states she slept 4.5 hours and ate 2x. Pt reports her pet turtle died on 2023/01/17 and pt is "in grief"  over it. Pt states the turtle was connected to her father so it also stirred up the grief from losing her father. Additionally, it was father's day over the weekend, which also increased grief. Pt states her mom and sister were supportive of her. Pt states "I just feel so sad." Patient able to process. Patient engaged in discussion.            Session Time: 10:00 am - 11:00 am   Participation Level: Active   Behavioral Response: CasualAlertDepressed   Type of Therapy: Group Therapy   Treatment Goals addressed: Coping   Progress Towards Goals: Progressing   Interventions: CBT, DBT, Solution Focused, Strength-based, Supportive, and Reframing   Therapist Response: Cln introduced DBT distress tolerance distraction skills. Cln provides context for distraction skills and why distraction is a foundational way to manage mood dysregulation. Group discussed how to apply distraction.   Therapist Response:  Pt engaged in discussion and reports understanding of how distraction can be applied to manage big feelings.          Session Time: 11:00 -12:00   Participation Level: Active   Behavioral Response: CasualAlertDepressed   Type of Therapy: Group Therapy   Treatment Goals addressed: Coping   Progress Towards Goals: Progressing   Interventions: CBT, DBT, Solution Focused, Strength-based, Supportive, and Reframing   Summary: Cln continued topic of DBT distress tolerance skills and the ACCEPTS distraction skill. Group reviewed A-C-C skills and discussed how they can practice them in their every day life.    Therapist Response:  Pt engaged in discussion and is able to state ways to apply these skills.  Session Time: 12:00 -1:00   Participation Level: Active   Behavioral Response: CasualAlertDepressed   Type of Therapy: Group therapy, Occupational Therapy   Treatment Goals addressed: Coping   Progress Towards Goals: Progressing   Interventions: Supportive;  Psychoeducation   Summary: 12:00 - 12:50: Occupational Therapy group led by cln E. Hollan. 12:50 - 1:00 Clinician assessed for immediate needs, medication compliance and efficacy, and safety concerns.   Therapist Response: 12:00 - 12:50: See OT note 12:50 - 1:00 pm: At check-out, patient reports no immediate concerns. Patient demonstrates progress as evidenced by continued engagement and responsiveness to treatment. Patient denies SI/HI/self-harm thoughts at the end of group.     Suicidal/Homicidal: Nowithout intent/plan  Plan: Pt will continue in PHP while working to stabilize post-hospitalization, decrease depression and anxiety symptoms, and increase ability to manage symptoms in a healthy manner.   Collaboration of Care: Medication Management AEB J. McQuilla  Patient/Guardian was advised Release of Information must be obtained prior to any record release in order to collaborate their care with an outside provider. Patient/Guardian was advised if they have not already done so to contact the registration department to sign all necessary forms in order for [place] to release information regarding their care.   Consent: Patient/Guardian gives verbal consent for treatment and assignment of benefits for services provided during this visit. Patient/Guardian expressed understanding and agreed to proceed.   Diagnosis: MDD (major depressive disorder), recurrent severe, without psychosis (HCC) [F33.2]    1. MDD (major depressive disorder), recurrent severe, without psychosis (HCC)      Donia Guiles, LCSW

## 2022-12-31 NOTE — Psych (Signed)
Virtual Visit via Video Note  I connected with [name] on 12/13/22 at  9:00 AM EDT by a video enabled telemedicine application and verified that I am speaking with the correct person using two identifiers.  Location: Patient: patient's mother's home Provider: clinical home office   I discussed the limitations of evaluation and management by telemedicine and the availability of in person appointments. The patient expressed understanding and agreed to proceed.  I discussed the assessment and treatment plan with the patient. The patient was provided an opportunity to ask questions and all were answered. The patient agreed with the plan and demonstrated an understanding of the instructions.   The patient was advised to call back or seek an in-person evaluation if the symptoms worsen or if the condition fails to improve as anticipated.  Pt was provided 240 minutes of non-face-to-face time during this encounter.   Donia Guiles, LCSW   [facility] PHP THERAPIST PROGRESS NOTE  FAMA TULLY [id number]  Session Time: 9:00 - 10:00  Participation Level: Active  Behavioral Response: CasualAlertDepressed  Type of Therapy: Group Therapy  Treatment Goals addressed: Coping  Progress Towards Goals: Initial  Interventions: CBT, DBT, Supportive, and Reframing  Summary: AKAILA PALANCA is a 47 y.o. female who presents with depression and anxiety symptoms.  Clinician led check-in regarding current stressors and situation, and review of patient completed daily inventory. Clinician utilized active listening and empathetic response and validated patient emotions. Clinician facilitated processing group on pertinent issues.?    Therapist Response:  Patient arrived within time allowed. Patient rates her mood at a 8 on a scale of 1-10 with 10 being best. Pt states she feels "surprisingly okay." Pt states she slept 5 hours and ate 2x. Pt reports her son woke in the early morning sick to his  stomach. Pt states it interrupted her sleep and created extra stress to her day. Pt reports being surprised that she does not feel overly tired or grumpy. Pt states she went to bible study last night, which she has missed more often than note in the past few weeks. Pt reports negative self-talk re: taking a nap instead of doing chores. Patient able to process. Patient engaged in discussion.            Session Time: 10:00 am - 11:00 am   Participation Level: Active   Behavioral Response: CasualAlertDepressed   Type of Therapy: Group Therapy   Treatment Goals addressed: Coping   Progress Towards Goals: Progressing   Interventions: CBT, DBT, Solution Focused, Strength-based, Supportive, and Reframing   Therapist Response: Cln led discussion on healthy aggression substitutes. Cln discussed the benefits to discharging energy and adrenaline when feeling "revved up" in emotion and the importance of balancing that discharge with safety and lack if negative consequences. Group brainstormed different ways to channel aggression in a healthy way and shared ways that have worked for them in the past.   Therapist Response:  Pt engaged in discussion and identifies 3 options to try.          Session Time: 11:00 -12:00   Participation Level: Active   Behavioral Response: CasualAlertDepressed   Type of Therapy: Group Therapy   Treatment Goals addressed: Coping   Progress Towards Goals: Progressing   Interventions: CBT, DBT, Solution Focused, Strength-based, Supportive, and Reframing   Summary: Cln led discussion on decision making and how to apply logic to fears. Group viewed TED talk "Why you should define your fears not your goals" to aid  discussion. Group discussed ways to consider how we can address fears and make them manageable.    Therapist Response:  Pt engaged in discussion and practices decision making model with group.          Session Time: 12:00 -1:00   Participation  Level: Active   Behavioral Response: CasualAlertDepressed   Type of Therapy: Group therapy, Occupational Therapy   Treatment Goals addressed: Coping   Progress Towards Goals: Progressing   Interventions: Supportive; Psychoeducation   Summary: 12:00 - 12:50: Occupational Therapy group led by cln E. Hollan. 12:50 - 1:00 Clinician assessed for immediate needs, medication compliance and efficacy, and safety concerns.   Therapist Response: 12:00 - 12:50: See OT note 12:50 - 1:00 pm: At check-out, patient reports no immediate concerns. Patient demonstrates progress as evidenced by continued engagement and responsiveness to treatment. Patient denies SI/HI/self-harm thoughts at the end of group.     Suicidal/Homicidal: Nowithout intent/plan  Plan: Pt will continue in PHP while working to stabilize post-hospitalization, decrease depression and anxiety symptoms, and increase ability to manage symptoms in a healthy manner.   Collaboration of Care: Medication Management AEB J. McQuilla  Patient/Guardian was advised Release of Information must be obtained prior to any record release in order to collaborate their care with an outside provider. Patient/Guardian was advised if they have not already done so to contact the registration department to sign all necessary forms in order for [place] to release information regarding their care.   Consent: Patient/Guardian gives verbal consent for treatment and assignment of benefits for services provided during this visit. Patient/Guardian expressed understanding and agreed to proceed.   Diagnosis: MDD (major depressive disorder), recurrent severe, without psychosis (HCC) [F33.2]    1. MDD (major depressive disorder), recurrent severe, without psychosis (HCC)   2. Anxiety state      Donia Guiles, LCSW

## 2022-12-31 NOTE — Psych (Signed)
Virtual Visit via Video Note  I connected with Sharon Bowman on 12/27/22 at  9:00 AM EDT by a video enabled telemedicine application and verified that I am speaking with the correct person using two identifiers.  Location: Patient: patient's home Provider: clinical home office   I discussed the limitations of evaluation and management by telemedicine and the availability of in person appointments. The patient expressed understanding and agreed to proceed.  I discussed the assessment and treatment plan with the patient. The patient was provided an opportunity to ask questions and all were answered. The patient agreed with the plan and demonstrated an understanding of the instructions.   The patient was advised to call back or seek an in-person evaluation if the symptoms worsen or if the condition fails to improve as anticipated.  Pt was provided 240 minutes of non-face-to-face time during this encounter.   Donia Guiles, LCSW   [facility] PHP THERAPIST PROGRESS NOTE  Sharon Bowman [id number]  Session Time: 9:00 - 10:00  Participation Level: Active  Behavioral Response: CasualAlertDepressed  Type of Therapy: Group Therapy  Treatment Goals addressed: Coping  Progress Towards Goals: Progressing  Interventions: CBT, DBT, Supportive, and Reframing  Summary: Sharon Bowman is a 47 y.o. female who presents with depression and anxiety symptoms.  Clinician led check-in regarding current stressors and situation, and review of patient completed daily inventory. Clinician utilized active listening and empathetic response and validated patient emotions. Clinician facilitated processing group on pertinent issues.?    Therapist Response:  Patient arrived within time allowed. Patient rates her mood at a 8 on a scale of 1-10 with 10 being best. Pt states she feels "great." Pt states she slept 7 hours and ate 2x. Pt reports she has been back in her own home for 2 nights now and is sleeping  better. Pt states she is excited to be back in her own space and being away from mom 100% of the time is improving pt's mood. Pt states she has been productive and is working to organize and clean the house to sell. Pt reports struggling with a call from her ex. Patient able to process. Patient engaged in discussion.            Session Time: 10:00 am - 11:00 am   Participation Level: Active   Behavioral Response: CasualAlertDepressed   Type of Therapy: Group Therapy   Treatment Goals addressed: Coping   Progress Towards Goals: Progressing   Interventions: CBT, DBT, Solution Focused, Strength-based, Supportive, and Reframing   Therapist Response: Cln introduced topic of dissociative episodes. Cln provided psycho-education on dissociation and provided space for pt's to discuss how they react to big feelings and past events in which they think they may have had a dissociative episode. Cln discussed the importance of noticing warning signs of big feelings to help prevent dissociative episodes.    Therapist Response:  Pt engaged in discussion and reports understanding of dissociation. Pt reports she has experienced these symptoms in the past. Pt was able to identify some warning signs of big feelings and identify coping skills to use early on.         Session Time: 11:00 -12:00   Participation Level: Active   Behavioral Response: CasualAlertDepressed   Type of Therapy: Group Therapy   Treatment Goals addressed: Coping   Progress Towards Goals: Progressing   Interventions: CBT, DBT, Solution Focused, Strength-based, Supportive, and Reframing   Summary: Cln introduced grounding techniques as a coping strategy. Cln utilized  handout "Detaching from emotional pain" from [employer]. Group reviewed grounding strategies and how they can apply them to their every day life and in which situations.    Therapist Response:  Pt engaged in discussion and is able to identify ways to  utilize the techniques.          Session Time: 12:00 -1:00   Participation Level: Active   Behavioral Response: CasualAlertDepressed   Type of Therapy: Group therapy, Occupational Therapy   Treatment Goals addressed: Coping   Progress Towards Goals: Progressing   Interventions: Supportive; Psychoeducation   Summary: 12:00 - 12:50: Occupational Therapy group led by cln E. Hollan. 12:50 - 1:00 Clinician assessed for immediate needs, medication compliance and efficacy, and safety concerns.   Therapist Response: 12:00 - 12:50: See OT note 12:50 - 1:00 pm: At check-out, patient reports no immediate concerns. Patient demonstrates progress as evidenced by continued engagement and responsiveness to treatment. Patient denies SI/HI/self-harm thoughts at the end of group.     Suicidal/Homicidal: Nowithout intent/plan  Plan: Pt will continue in PHP while working to stabilize post-hospitalization, decrease depression and anxiety symptoms, and increase ability to manage symptoms in a healthy manner.   Collaboration of Care: Medication Management AEB J. McQuilla  Patient/Guardian was advised Release of Information must be obtained prior to any record release in order to collaborate their care with an outside provider. Patient/Guardian was advised if they have not already done so to contact the registration department to sign all necessary forms in order for [place] to release information regarding their care.   Consent: Patient/Guardian gives verbal consent for treatment and assignment of benefits for services provided during this visit. Patient/Guardian expressed understanding and agreed to proceed.   Diagnosis: MDD (major depressive disorder), recurrent severe, without psychosis (HCC) [F33.2]    1. MDD (major depressive disorder), recurrent severe, without psychosis (HCC)   2. Anxiety state      Donia Guiles, LCSW

## 2023-01-01 ENCOUNTER — Other Ambulatory Visit (HOSPITAL_COMMUNITY): Payer: Medicare HMO

## 2023-01-01 ENCOUNTER — Other Ambulatory Visit (HOSPITAL_COMMUNITY): Payer: Medicare HMO | Admitting: Licensed Clinical Social Worker

## 2023-01-01 DIAGNOSIS — F332 Major depressive disorder, recurrent severe without psychotic features: Secondary | ICD-10-CM

## 2023-01-01 DIAGNOSIS — Z79899 Other long term (current) drug therapy: Secondary | ICD-10-CM | POA: Diagnosis not present

## 2023-01-01 DIAGNOSIS — F411 Generalized anxiety disorder: Secondary | ICD-10-CM | POA: Diagnosis not present

## 2023-01-01 DIAGNOSIS — N3010 Interstitial cystitis (chronic) without hematuria: Secondary | ICD-10-CM | POA: Diagnosis not present

## 2023-01-01 DIAGNOSIS — B9689 Other specified bacterial agents as the cause of diseases classified elsewhere: Secondary | ICD-10-CM | POA: Diagnosis not present

## 2023-01-01 DIAGNOSIS — M797 Fibromyalgia: Secondary | ICD-10-CM | POA: Diagnosis not present

## 2023-01-01 DIAGNOSIS — G8929 Other chronic pain: Secondary | ICD-10-CM | POA: Diagnosis not present

## 2023-01-01 NOTE — Psych (Signed)
Virtual Visit via Video Note  I connected with Sharon Bowman on 12/14/22 at  9:00 AM EDT by a video enabled telemedicine application and verified that I am speaking with the correct person using two identifiers.  Location: Patient: patient's mother's home Provider: clinical home office   I discussed the limitations of evaluation and management by telemedicine and the availability of in person appointments. The patient expressed understanding and agreed to proceed.  I discussed the assessment and treatment plan with the patient. The patient was provided an opportunity to ask questions and all were answered. The patient agreed with the plan and demonstrated an understanding of the instructions.   The patient was advised to call back or seek an in-person evaluation if the symptoms worsen or if the condition fails to improve as anticipated.  Pt was provided 240 minutes of non-face-to-face time during this encounter.   Donia Guiles, LCSW   [facility] PHP THERAPIST PROGRESS NOTE  Sharon Bowman [id number]  Session Time: 9:00 - 10:00  Participation Level: Active  Behavioral Response: CasualAlertDepressed  Type of Therapy: Group Therapy  Treatment Goals addressed: Coping  Progress Towards Goals: Initial  Interventions: CBT, DBT, Supportive, and Reframing  Summary: Sharon Bowman is a 47 y.o. female who presents with depression and anxiety symptoms.  Clinician led check-in regarding current stressors and situation, and review of patient completed daily inventory. Clinician utilized active listening and empathetic response and validated patient emotions. Clinician facilitated processing group on pertinent issues.?    Therapist Response:  Patient arrived within time allowed. Patient rates her mood at a 8 on a scale of 1-10 with 10 being best. Pt states she feels "better today." Pt states she slept 7 hours and ate 3x. Pt reports she had the best sleep of the past 3 weeks and is  feeling refreshed. Pt states she spent time outside, listening to music, and coloring. Pt reports feeling easily overwhelmed.  Patient able to process. Patient engaged in discussion.            Session Time: 10:00 am - 11:00 am   Participation Level: Active   Behavioral Response: CasualAlertDepressed   Type of Therapy: Group Therapy   Treatment Goals addressed: Coping   Progress Towards Goals: Progressing   Interventions: CBT, DBT, Solution Focused, Strength-based, Supportive, and Reframing   Therapist Response:  Cln led discussion on DBT dialectics and balance. Cln encouraged pt's to utilize AND statements to cue their brain to hold two opposing ideas. Cln provided examples and group members created their own AND statements.   Therapist Response:  Pt engaged in discussion and created AND statement around recognzing progress.         Session Time: 11:00 -12:00   Participation Level: Active   Behavioral Response: CasualAlertDepressed   Type of Therapy: Group Therapy   Treatment Goals addressed: Coping   Progress Towards Goals: Progressing   Interventions: CBT, DBT, Solution Focused, Strength-based, Supportive, and Reframing   Summary: Cln continued topic of CBT cognitive distortions and introduced thought challenging as a way to  utilize the "challenge" C in C-C-C. Group utilized [employer], [employer] questions" as a way to introduce challenges and reframe distorted thinking. Group members worked through pt examples to practice challenging distorted thinking.    Therapist Response: Pt engaged in discussion and demonstrates understanding of challenging distorted thoughts through practice.           Session Time: 12:00 -1:00   Participation Level: Active   Behavioral Response: CasualAlertDepressed  Type of Therapy: Group therapy, Occupational Therapy   Treatment Goals addressed: Coping   Progress Towards Goals: Progressing   Interventions: Supportive;  Psychoeducation   Summary: 12:00 - 12:50: Occupational Therapy group led by cln E. Hollan. 12:50 - 1:00 Clinician assessed for immediate needs, medication compliance and efficacy, and safety concerns.   Therapist Response: 12:00 - 12:50: See OT note 12:50 - 1:00 pm: At check-out, patient reports no immediate concerns. Patient demonstrates progress as evidenced by continued engagement and responsiveness to treatment. Patient denies SI/HI/self-harm thoughts at the end of group.     Suicidal/Homicidal: Nowithout intent/plan  Plan: Pt will continue in PHP while working to stabilize post-hospitalization, decrease depression and anxiety symptoms, and increase ability to manage symptoms in a healthy manner.   Collaboration of Care: Medication Management AEB J. McQuilla  Patient/Guardian was advised Release of Information must be obtained prior to any record release in order to collaborate their care with an outside provider. Patient/Guardian was advised if they have not already done so to contact the registration department to sign all necessary forms in order for [place] to release information regarding their care.   Consent: Patient/Guardian gives verbal consent for treatment and assignment of benefits for services provided during this visit. Patient/Guardian expressed understanding and agreed to proceed.   Diagnosis: MDD (major depressive disorder), recurrent severe, without psychosis (HCC) [F33.2]    1. MDD (major depressive disorder), recurrent severe, without psychosis (HCC)   2. Anxiety state      Donia Guiles, LCSW

## 2023-01-02 ENCOUNTER — Other Ambulatory Visit (HOSPITAL_COMMUNITY): Payer: Medicare HMO | Attending: Psychiatry

## 2023-01-02 ENCOUNTER — Telehealth (HOSPITAL_COMMUNITY): Payer: Self-pay | Admitting: Psychiatry

## 2023-01-02 ENCOUNTER — Other Ambulatory Visit (HOSPITAL_COMMUNITY): Payer: Medicare HMO | Admitting: Licensed Clinical Social Worker

## 2023-01-02 DIAGNOSIS — M797 Fibromyalgia: Secondary | ICD-10-CM | POA: Diagnosis not present

## 2023-01-02 DIAGNOSIS — F332 Major depressive disorder, recurrent severe without psychotic features: Secondary | ICD-10-CM | POA: Diagnosis not present

## 2023-01-02 DIAGNOSIS — F411 Generalized anxiety disorder: Secondary | ICD-10-CM

## 2023-01-02 DIAGNOSIS — Z79899 Other long term (current) drug therapy: Secondary | ICD-10-CM | POA: Diagnosis not present

## 2023-01-02 DIAGNOSIS — B9689 Other specified bacterial agents as the cause of diseases classified elsewhere: Secondary | ICD-10-CM | POA: Diagnosis not present

## 2023-01-02 DIAGNOSIS — R4589 Other symptoms and signs involving emotional state: Secondary | ICD-10-CM | POA: Insufficient documentation

## 2023-01-02 DIAGNOSIS — N3010 Interstitial cystitis (chronic) without hematuria: Secondary | ICD-10-CM | POA: Diagnosis not present

## 2023-01-02 DIAGNOSIS — G8929 Other chronic pain: Secondary | ICD-10-CM | POA: Diagnosis not present

## 2023-01-03 ENCOUNTER — Other Ambulatory Visit (HOSPITAL_COMMUNITY): Payer: Medicare HMO

## 2023-01-04 ENCOUNTER — Other Ambulatory Visit (HOSPITAL_COMMUNITY): Payer: Medicare HMO | Admitting: Licensed Clinical Social Worker

## 2023-01-04 ENCOUNTER — Other Ambulatory Visit (HOSPITAL_COMMUNITY): Payer: Medicare HMO

## 2023-01-04 DIAGNOSIS — F332 Major depressive disorder, recurrent severe without psychotic features: Secondary | ICD-10-CM

## 2023-01-04 DIAGNOSIS — B9689 Other specified bacterial agents as the cause of diseases classified elsewhere: Secondary | ICD-10-CM | POA: Diagnosis not present

## 2023-01-04 DIAGNOSIS — N3010 Interstitial cystitis (chronic) without hematuria: Secondary | ICD-10-CM | POA: Diagnosis not present

## 2023-01-04 DIAGNOSIS — F411 Generalized anxiety disorder: Secondary | ICD-10-CM

## 2023-01-04 DIAGNOSIS — Z79899 Other long term (current) drug therapy: Secondary | ICD-10-CM | POA: Diagnosis not present

## 2023-01-04 DIAGNOSIS — R4589 Other symptoms and signs involving emotional state: Secondary | ICD-10-CM

## 2023-01-04 DIAGNOSIS — M797 Fibromyalgia: Secondary | ICD-10-CM | POA: Diagnosis not present

## 2023-01-04 DIAGNOSIS — G8929 Other chronic pain: Secondary | ICD-10-CM | POA: Diagnosis not present

## 2023-01-07 ENCOUNTER — Other Ambulatory Visit (HOSPITAL_COMMUNITY): Payer: Medicare HMO

## 2023-01-07 ENCOUNTER — Other Ambulatory Visit (HOSPITAL_COMMUNITY): Payer: Medicare HMO | Admitting: Licensed Clinical Social Worker

## 2023-01-07 DIAGNOSIS — N3010 Interstitial cystitis (chronic) without hematuria: Secondary | ICD-10-CM | POA: Diagnosis not present

## 2023-01-07 DIAGNOSIS — B9689 Other specified bacterial agents as the cause of diseases classified elsewhere: Secondary | ICD-10-CM | POA: Diagnosis not present

## 2023-01-07 DIAGNOSIS — M797 Fibromyalgia: Secondary | ICD-10-CM | POA: Diagnosis not present

## 2023-01-07 DIAGNOSIS — F411 Generalized anxiety disorder: Secondary | ICD-10-CM

## 2023-01-07 DIAGNOSIS — F332 Major depressive disorder, recurrent severe without psychotic features: Secondary | ICD-10-CM

## 2023-01-07 DIAGNOSIS — G8929 Other chronic pain: Secondary | ICD-10-CM | POA: Diagnosis not present

## 2023-01-07 DIAGNOSIS — Z79899 Other long term (current) drug therapy: Secondary | ICD-10-CM | POA: Diagnosis not present

## 2023-01-07 DIAGNOSIS — R4589 Other symptoms and signs involving emotional state: Secondary | ICD-10-CM
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
## 2023-01-08 ENCOUNTER — Other Ambulatory Visit (HOSPITAL_COMMUNITY): Payer: Medicare HMO | Attending: Psychiatry | Admitting: Psychiatry

## 2023-01-08 ENCOUNTER — Ambulatory Visit (HOSPITAL_COMMUNITY): Payer: Medicare HMO

## 2023-01-08 ENCOUNTER — Encounter (HOSPITAL_COMMUNITY): Payer: Self-pay

## 2023-01-08 ENCOUNTER — Ambulatory Visit (HOSPITAL_COMMUNITY): Payer: Medicare HMO | Admitting: Psychiatry

## 2023-01-08 ENCOUNTER — Encounter (HOSPITAL_COMMUNITY): Payer: Self-pay | Admitting: Psychiatry

## 2023-01-08 DIAGNOSIS — F332 Major depressive disorder, recurrent severe without psychotic features: Secondary | ICD-10-CM | POA: Insufficient documentation

## 2023-01-08 DIAGNOSIS — F331 Major depressive disorder, recurrent, moderate: Secondary | ICD-10-CM

## 2023-01-08 DIAGNOSIS — F411 Generalized anxiety disorder: Secondary | ICD-10-CM | POA: Insufficient documentation

## 2023-01-08 DIAGNOSIS — R4589 Other symptoms and signs involving emotional state: Secondary | ICD-10-CM | POA: Diagnosis not present

## 2023-01-08 MED ORDER — GABAPENTIN 400 MG PO CAPS: 400.0000 mg | ORAL_CAPSULE | Freq: Three times a day (TID) | ORAL | 0 refills | Status: DC

## 2023-01-08 MED ORDER — QUETIAPINE FUMARATE 25 MG PO TABS: 25.0000 mg | ORAL_TABLET | Freq: Every day | ORAL | 0 refills | Status: DC

## 2023-01-08 MED ORDER — DULOXETINE HCL 30 MG PO CPEP: 90.0000 mg | ORAL_CAPSULE | Freq: Every day | ORAL | 0 refills | Status: DC
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
## 2023-01-08 NOTE — Therapy (Signed)
[facility] PARTIAL HOSPITALIZATION PROGRAM [address] [place], [place], [address] Phone: [phone]   Fax:  [phone]  Occupational Therapy Treatment Virtual Visit via Video Note  I connected with [name] on 01/08/23 at  8:00 AM EDT by a video enabled telemedicine application and verified that I am speaking with the correct person using two identifiers.  Location: Patient: home Provider: office   I discussed the limitations of evaluation and management by telemedicine and the availability of in person appointments. The patient expressed understanding and agreed to proceed.    The patient was advised to call back or seek an in-person evaluation if the symptoms worsen or if the condition fails to improve as anticipated.  I provided 55 minutes of non-face-to-face time during this encounter.   Patient Details  Name: Sharon Bowman MRN: [id number] Date of Birth: December 11, 1975 No data recorded  Encounter Date: 01/07/2023   OT End of Session - 01/08/23 1049     Visit Number 11    Number of Visits 20    Date for OT Re-Evaluation 01/06/23    OT Start Time 1145    OT Stop Time 1240    OT Time Calculation (min) 55 min             Past Medical History:  Diagnosis Date   Allergic asthma 08/2015   Anemia    Anxiety    Arthritis    osetoarthritis   Chronic sinusitis    Depression    DVT (deep venous thrombosis) (HCC) 04/2014   right leg - behind calf, no treated with aspirin daily   DVT (deep venous thrombosis) (HCC)    Pt states hx DVT in R knee and R upper thigh in 2015   Endometriosis    Endometriosis    Family history of premature CAD    Fibromyalgia 2017   Former smoker    GAD (generalized anxiety disorder)    Headache    History of PFTs 08/2015   normal   Interstitial cystitis    Interstitial cystitis    PCOS (polycystic ovarian syndrome)    Polycystic kidney disease     Past Surgical History:  Procedure Laterality  Date   ABDOMINAL HYSTERECTOMY     BLADDER SURGERY     BLADDER STRETCH X 3   carpel radial tunnel  2014   carpel tunnel     left  and right hand    CYSTO WITH HYDRODISTENSION N/A 11/11/2014   Procedure: CYSTOSCOPY/HYDRODISTENSION MARCAINE AND PYRIDIUM AND Doreatha Lew;  Surgeon: Jethro Bolus, MD;  Location: [facility];  Service: Urology;  Laterality: N/A;   CYSTOSCOPY N/A 11/11/2014   Procedure: CYSTOSCOPY;  Surgeon: Noland Fordyce, MD;  Location: [facility];  Service: Gynecology;  Laterality: N/A;   CYSTOSCOPY WITH RETROGRADE PYELOGRAM, URETEROSCOPY AND STENT PLACEMENT Bilateral 11/11/2014   Procedure:  RETROGRADE PYELOGRAM with BILATERAL URETERAL CATHETHERS;  Surgeon: Jethro Bolus, MD;  Location: [facility];  Service: Urology;  Laterality: Bilateral;   IUD REMOVAL N/A 11/11/2014   Procedure: INTRAUTERINE DEVICE (IUD) REMOVAL;  Surgeon: Noland Fordyce, MD;  Location: [facility];  Service: Gynecology;  Laterality: N/A;   LAPAROSCOPY     X 2 ENDOMETRIOSIS.   LEG SURGERY  04/2014   right knee - tumor - benighn   ROBOTIC ASSISTED LAPAROSCOPIC LYSIS OF ADHESION N/A 11/11/2014   Procedure: ROBOTIC ASSISTED LAPAROSCOPIC LYSIS OF ADHESION;  Surgeon: Noland Fordyce, MD;  Location: [facility];  Service: Gynecology;  Laterality: N/A;  ROBOTIC ASSISTED TOTAL HYSTERECTOMY WITH BILATERAL SALPINGO OOPHERECTOMY Bilateral 11/11/2014   Procedure: ROBOTIC ASSISTED TOTAL HYSTERECTOMY WITH BILATERAL SALPINGO OOPHORECTOMY;  Surgeon: Noland Fordyce, MD;  Location: [facility];  Service: Gynecology;  Laterality: Bilateral;   TOOTH EXTRACTION      There were no vitals filed for this visit.   Subjective Assessment - 01/08/23 1048     Currently in Pain? No/denies    Pain Score 0-No pain                Group Session:  S: Doing better today. Optimistic for the future and IOP.  O: Today's OT group aims to explore the intricate relationship between chronic pain, depression, and the resultant impact on Activities of  Daily Living (ADLs) and Instrumental Activities of Daily Living (iADLs). Through a multidisciplinary lens, it delves into common causes and symptoms, offering occupational therapy-aligned strategies for symptom improvement without medication. Among these strategies are targeted core and lower extremity strengthening exercises to mitigate low back pain due to anterior pelvic tilt, as well as granular communication techniques for patients to effectively express their chronic pain symptoms to healthcare providers. The emphasis is on non-pharmacological, adaptive approaches to improve mental health and functional performance, providing both healthcare providers and patients a well-rounded understanding of managing chronic pain and depression.   A: The patient was highly engaged throughout the presentation, demonstrating keen interest by actively participating in discussions and asking insightful questions regarding pain measurement scales and adaptive strategies for daily living. This level of engagement suggests a strong willingness to implement the discussed techniques in their own life, making them a good candidate for a tailored occupational therapy intervention plan aimed at addressing their specific challenges related to chronic pain and depression.  OCCUPATIONAL THERAPY DISCHARGE SUMMARY  Visits from Start of Care: 11  Current functional level related to goals / functional outcomes: Pt has met all OT goals and is ready for DC at this time.    Remaining deficits: There are no remaining OT related deficits.    Plan: Patient agrees to discharge.                       OT Education - 01/08/23 1048     Education Details Pain              OT Short Term Goals - 12/17/22 1827       OT SHORT TERM GOAL #1   Title Client will develop and utilize a personalized coping toolbox containing at least five coping strategies to manage challenging situations, demonstrating their use in  real-life scenarios by the end of therapy.    Time 4    Period Weeks    Status met   Target Date 01/06/23      OT SHORT TERM GOAL #2   Title Client will independently identify and modify three areas of the current routine that contribute to increased stress or dysfunction by the end of therapy.    Time 4    Period Weeks    Status met   Target Date 01/06/23      OT SHORT TERM GOAL #3   Title By the time of discharge, client will independently set, track, and make progress towards a long-term goal, demonstrating resilience in overcoming obstacles and seeking support when needed.    Status met                     Plan - 01/08/23 1049  Psychosocial Skills Coping Strategies;Interpersonal Interaction;Routines and Behaviors;Habits             Patient will benefit from skilled therapeutic intervention in order to improve the following deficits and impairments:       Psychosocial Skills: Coping Strategies, Interpersonal Interaction, Routines and Behaviors, Habits   Visit Diagnosis: Difficulty coping    Problem List Patient Active Problem List   Diagnosis Date Noted   MDD (major depressive disorder), recurrent severe, without psychosis (HCC) 11/29/2022   Anxiety state 04/01/2022   Migraine with aura and without status migrainosus, not intractable 12/19/2021   Cubital tunnel syndrome of both upper extremities 11/03/2021   Polyarthralgia 09/27/2021   Iron deficiency anemia 09/27/2021   Mixed hyperlipidemia 09/27/2021   Surgical menopause 08/06/2016   Vitamin D deficiency 08/06/2016   Anxiety and depression 09/29/2015   History of DVT (deep vein thrombosis) 09/29/2015   Fibromyalgia 08/01/2015   Insomnia 08/01/2015   IC (interstitial cystitis) 03/21/2012   Polycystic kidney disease 10/09/2011    Ted Mcalpine, OT 01/08/2023, 10:49 AM Kerrin Champagne, OT  [facility] HOSPITALIZATION PROGRAM [address] [place], [place], [address] Phone: [phone]   Fax:  [phone]  Name: Sharon Bowman MRN: [id number] Date of Birth: [birth year]

## 2023-01-08 NOTE — Therapy (Signed)
[facility] PARTIAL HOSPITALIZATION PROGRAM [address] [place], [place], [address] Phone: [phone]   Fax:  [phone]  Occupational Therapy Treatment Virtual Visit via Video Note  I connected with [name] on 01/08/23 at  8:00 AM EDT by a video enabled telemedicine application and verified that I am speaking with the correct person using two identifiers.  Location: Patient: home Provider: office   I discussed the limitations of evaluation and management by telemedicine and the availability of in person appointments. The patient expressed understanding and agreed to proceed.    The patient was advised to call back or seek an in-person evaluation if the symptoms worsen or if the condition fails to improve as anticipated.  I provided 55 minutes of non-face-to-face time during this encounter.  Patient Details  Name: Sharon Bowman MRN: [id number] Date of Birth: 03-31-1976 No data recorded  Encounter Date: 01/02/2023   OT End of Session - 01/08/23 1042     Visit Number 9    Number of Visits 20    Date for OT Re-Evaluation 01/06/23    OT Start Time 1200    OT Stop Time 1255    OT Time Calculation (min) 55 min             Past Medical History:  Diagnosis Date   Allergic asthma 08/2015   Anemia    Anxiety    Arthritis    osetoarthritis   Chronic sinusitis    Depression    DVT (deep venous thrombosis) (HCC) 04/2014   right leg - behind calf, no treated with aspirin daily   DVT (deep venous thrombosis) (HCC)    Pt states hx DVT in R knee and R upper thigh in 2015   Endometriosis    Endometriosis    Family history of premature CAD    Fibromyalgia 2017   Former smoker    GAD (generalized anxiety disorder)    Headache    History of PFTs 08/2015   normal   Interstitial cystitis    Interstitial cystitis    PCOS (polycystic ovarian syndrome)    Polycystic kidney disease     Past Surgical History:  Procedure Laterality  Date   ABDOMINAL HYSTERECTOMY     BLADDER SURGERY     BLADDER STRETCH X 3   carpel radial tunnel  2014   carpel tunnel     left  and right hand    CYSTO WITH HYDRODISTENSION N/A 11/11/2014   Procedure: CYSTOSCOPY/HYDRODISTENSION MARCAINE AND PYRIDIUM AND Doreatha Lew;  Surgeon: Jethro Bolus, MD;  Location: [facility];  Service: Urology;  Laterality: N/A;   CYSTOSCOPY N/A 11/11/2014   Procedure: CYSTOSCOPY;  Surgeon: Noland Fordyce, MD;  Location: [facility];  Service: Gynecology;  Laterality: N/A;   CYSTOSCOPY WITH RETROGRADE PYELOGRAM, URETEROSCOPY AND STENT PLACEMENT Bilateral 11/11/2014   Procedure:  RETROGRADE PYELOGRAM with BILATERAL URETERAL CATHETHERS;  Surgeon: Jethro Bolus, MD;  Location: [facility];  Service: Urology;  Laterality: Bilateral;   IUD REMOVAL N/A 11/11/2014   Procedure: INTRAUTERINE DEVICE (IUD) REMOVAL;  Surgeon: Noland Fordyce, MD;  Location: [facility];  Service: Gynecology;  Laterality: N/A;   LAPAROSCOPY     X 2 ENDOMETRIOSIS.   LEG SURGERY  04/2014   right knee - tumor - benighn   ROBOTIC ASSISTED LAPAROSCOPIC LYSIS OF ADHESION N/A 11/11/2014   Procedure: ROBOTIC ASSISTED LAPAROSCOPIC LYSIS OF ADHESION;  Surgeon: Noland Fordyce, MD;  Location: [facility];  Service: Gynecology;  Laterality: N/A;  ROBOTIC ASSISTED TOTAL HYSTERECTOMY WITH BILATERAL SALPINGO OOPHERECTOMY Bilateral 11/11/2014   Procedure: ROBOTIC ASSISTED TOTAL HYSTERECTOMY WITH BILATERAL SALPINGO OOPHORECTOMY;  Surgeon: Noland Fordyce, MD;  Location: [facility];  Service: Gynecology;  Laterality: Bilateral;   TOOTH EXTRACTION      There were no vitals filed for this visit.   Subjective Assessment - 01/08/23 1042     Currently in Pain? No/denies    Pain Score 0-No pain                  Group Session:  S: Doing better today  O: The primary objective of this group therapy session is to equip participants with practical strategies and coping mechanisms to effectively manage accumulated tasks that  seem insurmountable due to mental health challenges such as depression and anxiety. The group aims to build a supportive environment wherein individuals can openly discuss their struggles, share experiences, and learn from one another. The session's strategies will encompass goal setting, time management, task breakdown, creating conducive environments, and mindfulness practices. We aim to help participants perceive tasks as manageable units rather than overwhelming piles and encourage an approach of progress over perfection.   A: Patient demonstrated active engagement throughout the session. They contributed valuable insights during discussions, asked relevant questions, and showed enthusiasm towards learning new strategies. Their interaction with other group members was respectful and empathetic, contributing to a supportive group dynamic. Patient appeared to resonate with the strategies of "breaking it down" and "creating a conducive environment," and they shared plans to incorporate these strategies into their daily routine. Their active participation, willingness to share personal experiences, and acceptance of new strategies demonstrate a strong benefit from this therapy session.   P: Continue to attend PHP OT group sessions 5x week for 4 weeks to promote daily structure, social engagement, and opportunities to develop and utilize adaptive strategies to maximize functional performance in preparation for safe transition and integration back into school, work, and the community. Plan to address topic of tbd in next OT group session.                 OT Education - 01/08/23 1042     Education Details Overcoming Task Accumulation              OT Short Term Goals - 12/17/22 1827       OT SHORT TERM GOAL #1   Title Client will develop and utilize a personalized coping toolbox containing at least five coping strategies to manage challenging situations, demonstrating their use in  real-life scenarios by the end of therapy.    Time 4    Period Weeks    Status On-going    Target Date 01/06/23      OT SHORT TERM GOAL #2   Title Client will independently identify and modify three areas of the current routine that contribute to increased stress or dysfunction by the end of therapy.    Time 4    Period Weeks    Status On-going    Target Date 01/06/23      OT SHORT TERM GOAL #3   Title By the time of discharge, client will independently set, track, and make progress towards a long-term goal, demonstrating resilience in overcoming obstacles and seeking support when needed.    Status On-going                      Plan - 01/08/23 1042     Psychosocial Skills Coping Strategies;Interpersonal  Interaction;Routines and Behaviors;Habits             Patient will benefit from skilled therapeutic intervention in order to improve the following deficits and impairments:       Psychosocial Skills: Coping Strategies, Interpersonal Interaction, Routines and Behaviors, Habits   Visit Diagnosis: Difficulty coping    Problem List Patient Active Problem List   Diagnosis Date Noted   MDD (major depressive disorder), recurrent severe, without psychosis (HCC) 11/29/2022   Anxiety state 04/01/2022   Migraine with aura and without status migrainosus, not intractable 12/19/2021   Cubital tunnel syndrome of both upper extremities 11/03/2021   Polyarthralgia 09/27/2021   Iron deficiency anemia 09/27/2021   Mixed hyperlipidemia 09/27/2021   Surgical menopause 08/06/2016   Vitamin D deficiency 08/06/2016   Anxiety and depression 09/29/2015   History of DVT (deep vein thrombosis) 09/29/2015   Fibromyalgia 08/01/2015   Insomnia 08/01/2015   IC (interstitial cystitis) 03/21/2012   Polycystic kidney disease 10/09/2011    Ted Mcalpine, OT 01/08/2023, 10:43 AM  Kerrin Champagne, OT   [facility] HOSPITALIZATION PROGRAM [address]  [address] [place], [place], [address] Phone: [phone]   Fax:  [phone]  Name: Sharon Bowman MRN: [id number] Date of Birth: 1975-12-15

## 2023-01-08 NOTE — Therapy (Signed)
[facility] PARTIAL HOSPITALIZATION PROGRAM [address] [place], [place], [address] Phone: [phone]   Fax:  [phone]  Occupational Therapy Treatment Virtual Visit via Video Note  I connected with [name] on 01/08/23 at  8:00 AM EDT by a video enabled telemedicine application and verified that I am speaking with the correct person using two identifiers.  Location: Patient: home Provider: office   I discussed the limitations of evaluation and management by telemedicine and the availability of in person appointments. The patient expressed understanding and agreed to proceed.    The patient was advised to call back or seek an in-person evaluation if the symptoms worsen or if the condition fails to improve as anticipated.  I provided 50 minutes of non-face-to-face time during this encounter.   Patient Details  Name: Sharon Bowman MRN: [id number] Date of Birth: 08-Nov-1975 No data recorded  Encounter Date: 01/04/2023   OT End of Session - 01/08/23 1045     Visit Number 10    Number of Visits 20    Date for OT Re-Evaluation 01/06/23    OT Start Time 1145    OT Stop Time 1235    OT Time Calculation (min) 50 min             Past Medical History:  Diagnosis Date   Allergic asthma 08/2015   Anemia    Anxiety    Arthritis    osetoarthritis   Chronic sinusitis    Depression    DVT (deep venous thrombosis) (HCC) 04/2014   right leg - behind calf, no treated with aspirin daily   DVT (deep venous thrombosis) (HCC)    Pt states hx DVT in R knee and R upper thigh in 2015   Endometriosis    Endometriosis    Family history of premature CAD    Fibromyalgia 2017   Former smoker    GAD (generalized anxiety disorder)    Headache    History of PFTs 08/2015   normal   Interstitial cystitis    Interstitial cystitis    PCOS (polycystic ovarian syndrome)    Polycystic kidney disease     Past Surgical History:  Procedure Laterality  Date   ABDOMINAL HYSTERECTOMY     BLADDER SURGERY     BLADDER STRETCH X 3   carpel radial tunnel  2014   carpel tunnel     left  and right hand    CYSTO WITH HYDRODISTENSION N/A 11/11/2014   Procedure: CYSTOSCOPY/HYDRODISTENSION MARCAINE AND PYRIDIUM AND Doreatha Lew;  Surgeon: Jethro Bolus, MD;  Location: [facility];  Service: Urology;  Laterality: N/A;   CYSTOSCOPY N/A 11/11/2014   Procedure: CYSTOSCOPY;  Surgeon: Noland Fordyce, MD;  Location: [facility];  Service: Gynecology;  Laterality: N/A;   CYSTOSCOPY WITH RETROGRADE PYELOGRAM, URETEROSCOPY AND STENT PLACEMENT Bilateral 11/11/2014   Procedure:  RETROGRADE PYELOGRAM with BILATERAL URETERAL CATHETHERS;  Surgeon: Jethro Bolus, MD;  Location: [facility];  Service: Urology;  Laterality: Bilateral;   IUD REMOVAL N/A 11/11/2014   Procedure: INTRAUTERINE DEVICE (IUD) REMOVAL;  Surgeon: Noland Fordyce, MD;  Location: [facility];  Service: Gynecology;  Laterality: N/A;   LAPAROSCOPY     X 2 ENDOMETRIOSIS.   LEG SURGERY  04/2014   right knee - tumor - benighn   ROBOTIC ASSISTED LAPAROSCOPIC LYSIS OF ADHESION N/A 11/11/2014   Procedure: ROBOTIC ASSISTED LAPAROSCOPIC LYSIS OF ADHESION;  Surgeon: Noland Fordyce, MD;  Location: [facility];  Service: Gynecology;  Laterality: N/A;  ROBOTIC ASSISTED TOTAL HYSTERECTOMY WITH BILATERAL SALPINGO OOPHERECTOMY Bilateral 11/11/2014   Procedure: ROBOTIC ASSISTED TOTAL HYSTERECTOMY WITH BILATERAL SALPINGO OOPHORECTOMY;  Surgeon: Noland Fordyce, MD;  Location: [facility];  Service: Gynecology;  Laterality: Bilateral;   TOOTH EXTRACTION      There were no vitals filed for this visit.   Subjective Assessment - 01/08/23 1045     Currently in Pain? No/denies    Pain Score 0-No pain                Group Session:  S: Doing better today.   O: The primary objective of this group therapy session is to equip participants with practical strategies and coping mechanisms to effectively manage accumulated tasks that  seem insurmountable due to mental health challenges such as depression and anxiety. The group aims to build a supportive environment wherein individuals can openly discuss their struggles, share experiences, and learn from one another. The session's strategies will encompass goal setting, time management, task breakdown, creating conducive environments, and mindfulness practices. We aim to help participants perceive tasks as manageable units rather than overwhelming piles and encourage an approach of progress over perfection.   A: Patient demonstrated active engagement throughout the session. They contributed valuable insights during discussions, asked relevant questions, and showed enthusiasm towards learning new strategies. Their interaction with other group members was respectful and empathetic, contributing to a supportive group dynamic. Patient appeared to resonate with the strategies of "breaking it down" and "creating a conducive environment," and they shared plans to incorporate these strategies into their daily routine. Their active participation, willingness to share personal experiences, and acceptance of new strategies demonstrate a strong benefit from this therapy session.   P: Continue to attend PHP OT group sessions 5x week for 4 weeks to promote daily structure, social engagement, and opportunities to develop and utilize adaptive strategies to maximize functional performance in preparation for safe transition and integration back into school, work, and the community. Plan to address topic of pain in next OT group session.                   OT Education - 01/08/23 1045     Education Details Overcoming Task Accumulation 2              OT Short Term Goals - 12/17/22 1827       OT SHORT TERM GOAL #1   Title Client will develop and utilize a personalized coping toolbox containing at least five coping strategies to manage challenging situations, demonstrating their use  in real-life scenarios by the end of therapy.    Time 4    Period Weeks    Status On-going    Target Date 01/06/23      OT SHORT TERM GOAL #2   Title Client will independently identify and modify three areas of the current routine that contribute to increased stress or dysfunction by the end of therapy.    Time 4    Period Weeks    Status On-going    Target Date 01/06/23      OT SHORT TERM GOAL #3   Title By the time of discharge, client will independently set, track, and make progress towards a long-term goal, demonstrating resilience in overcoming obstacles and seeking support when needed.    Status On-going                      Plan - 01/08/23 1046     Psychosocial Skills  Coping Strategies;Interpersonal Interaction;Routines and Behaviors;Habits             Patient will benefit from skilled therapeutic intervention in order to improve the following deficits and impairments:       Psychosocial Skills: Coping Strategies, Interpersonal Interaction, Routines and Behaviors, Habits   Visit Diagnosis: Difficulty coping    Problem List Patient Active Problem List   Diagnosis Date Noted   MDD (major depressive disorder), recurrent severe, without psychosis (HCC) 11/29/2022   Anxiety state 04/01/2022   Migraine with aura and without status migrainosus, not intractable 12/19/2021   Cubital tunnel syndrome of both upper extremities 11/03/2021   Polyarthralgia 09/27/2021   Iron deficiency anemia 09/27/2021   Mixed hyperlipidemia 09/27/2021   Surgical menopause 08/06/2016   Vitamin D deficiency 08/06/2016   Anxiety and depression 09/29/2015   History of DVT (deep vein thrombosis) 09/29/2015   Fibromyalgia 08/01/2015   Insomnia 08/01/2015   IC (interstitial cystitis) 03/21/2012   Polycystic kidney disease 10/09/2011    Ted Mcalpine, OT 01/08/2023, 10:46 AM  Kerrin Champagne, OT   [facility] HOSPITALIZATION PROGRAM [address] [place], [place], [address] Phone: [phone]   Fax:  [phone]  Name: TOYE ROUILLARD MRN: [id number] Date of Birth: 1975-08-08
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
## 2023-01-08 NOTE — Progress Notes (Signed)
Virtual Visit via Video Note   I connected with Sharon Bowman on 01/08/23 at  9:00 AM EDT by a video enabled telemedicine application and verified that I am speaking with the correct person using two identifiers.   At orientation to the IOP program, Case Manager discussed the limitations of evaluation and management by telemedicine and the availability of in person appointments. The patient expressed understanding and agreed to proceed with virtual visits throughout the duration of the program.   Location:  Patient: Patient Home Provider: [facility]   History of Present Illness: MDD with anxious distress  Observations/Objective: Check In: Case Manager checked in with all participants to review discharge dates, insurance authorizations, work-related documents and needs from the treatment team regarding medications. Sharon Bowman stated needs and engaged in discussion.    Initial Therapeutic Activity: Counselor facilitated a check-in with Sharon Bowman to assess for safety, sobriety and medication compliance.  Counselor also inquired about Sharon Bowman's current emotional ratings, as well as any significant changes in thoughts, feelings or behavior since previous check in.  Sharon Bowman presented for session on time and was alert, oriented x5, with no evidence or self-report of active SI/HI or A/V H.  Sharon Bowman reported compliance with medication and denied use of alcohol or illicit substances.  Sharon Bowman reported scores of 7/10 for depression, 7/10 for anxiety, and 0/10 for anger/irritability.  Sharon Bowman denied any recent outbursts or panic attacks.  Sharon Bowman reported that a recent success was completing PHP and making the decision to continue treatment by starting MHIOP.  Sharon Bowman reported that an ongoing struggle is coping with anxiety and depression, stating "I still need a lot of help with that".  Sharon Bowman reported that her goal today is to do some cleaning and organizing around her home as she prepares to move.       Second Therapeutic  Activity: Counselor introduced [employer], [place] Chaplain to provide psychoeducation on topic of Grief and Loss with members today.  Sharon Bowman began discussion by checking in with the group about their baseline mood today, general thoughts on what grief means to them and how it has affected them personally in the past.  Sharon Bowman provided information on how the process of grief/loss can differ depending upon one's unique culture, and categories of loss one could experience (i.e. loss of a person, animal, relationship, job, identity, etc).  Sharon Bowman encouraged members to be mindful of how pervasive loss can be, and how to recognize signs which could indicate that this is having an impact on one's overall mental health and wellbeing.  Intervention was effective, as evidenced by Sharon Bowman participating in discussion with speaker on the subject, reporting that she lost her father around Christmas, so when this holiday comes back around, she anticipates that she will be grieving.  She was receptive to feedback from chaplain on benefits that grief counseling could offer.      Third Therapeutic Activity: Counselor covered topic of attachment styles today.  Counselor virtually shared a handout with the group on this topic which defined attachment styles as how people think about and behave in relationships.  Styles were broken down by category, including secure attachment where one believes close relationships are trustworthy, compared to insecure attachment (i.e. anxious, avoidant, or anxious-avoidant) where one is distrusting or worries about their bond with others.  Counselor inquired about which attachment style members most related to, how this has influenced their mental health/well-being, and whether they intend to begin making any changes.  Intervention effectiveness could not be  measured, as client did not participate.    Assessment and Plan: Counselor recommends that [place] remain in IOP treatment to better manage  mental health symptoms, ensure stability and pursue completion of treatment plan goals. Counselor recommends adherence to crisis/safety plan, taking medications as prescribed, and following up with medical professionals if any issues arise.    Follow Up Instructions: Counselor will send Webex link for session tomorrow.  Sharon Bowman was advised to call back or seek an in-person evaluation if the symptoms worsen or if the condition fails to improve as anticipated.   Collaboration of Care:   Medication Management AEB Dr. Eliseo Gum or Hillery Jacks, NP                                          Case Manager AEB Jeri Modena, CNA   Patient/Guardian was advised Release of Information must be obtained prior to any record release in order to collaborate their care with an outside provider. Patient/Guardian was advised if they have not already done so to contact the registration department to sign all necessary forms in order for [place] to release information regarding their care.   Consent: Patient/Guardian gives verbal consent for treatment and assignment of benefits for services provided during this visit. Patient/Guardian expressed understanding and agreed to proceed.  I provided 170 minutes of non-face-to-face time during this encounter.   Noralee Stain, LCSW, LCAS 01/08/23

## 2023-01-09 ENCOUNTER — Other Ambulatory Visit (HOSPITAL_COMMUNITY): Payer: Medicare HMO | Admitting: Licensed Clinical Social Worker

## 2023-01-09 DIAGNOSIS — F331 Major depressive disorder, recurrent, moderate: Secondary | ICD-10-CM

## 2023-01-09 DIAGNOSIS — R4589 Other symptoms and signs involving emotional state: Secondary | ICD-10-CM | POA: Diagnosis not present

## 2023-01-09 DIAGNOSIS — F332 Major depressive disorder, recurrent severe without psychotic features: Secondary | ICD-10-CM | POA: Diagnosis not present

## 2023-01-09 DIAGNOSIS — F411 Generalized anxiety disorder: Secondary | ICD-10-CM | POA: Diagnosis not present

## 2023-01-09 NOTE — Progress Notes (Signed)
Virtual Visit via Video Note   I connected with [name] on 01/09/23 at  9:00 AM EDT by a video enabled telemedicine application and verified that I am speaking with the correct person using two identifiers.   At orientation to the IOP program, Case Manager discussed the limitations of evaluation and management by telemedicine and the availability of in person appointments. The patient expressed understanding and agreed to proceed with virtual visits throughout the duration of the program.   Location:  Patient: Patient Home Provider: [facility]   History of Present Illness: MDD with anxious distress   Observations/Objective: Check In: Case Manager checked in with all participants to review discharge dates, insurance authorizations, work-related documents and needs from the treatment team regarding medications. Darius stated needs and engaged in discussion.    Initial Therapeutic Activity: Counselor facilitated a check-in with Tresa Endo to assess for safety, sobriety and medication compliance.  Counselor also inquired about Blanchie's current emotional ratings, as well as any significant changes in thoughts, feelings or behavior since previous check in.  [place] presented for session on time and was alert, oriented x5, with no evidence or self-report of active SI/HI or A/V H.  Arie reported compliance with medication and denied use of alcohol or illicit substances.  Hiyab reported scores of 5/10 for depression, 8/10 for anxiety, and 4/10 for anger/irritability.  Jamariyah denied any recent outbursts or panic attacks.  Alma reported that a recent success was having a productive day yesterday, as she spent time cleaning up and packing in the home to prepare for a yard sale before she moves.  Indira denied any new struggles.  Meilani reported that her goal today is to continue packing up around her home to stay busy.       Second Therapeutic Activity: Counselor engaged the group in discussion on managing  work/life balance today to improve mental health and wellness.  Counselor explained how finding balance between responsibilities at home and work place can be challenging, and lead to increased stress.  Counselor facilitated discussion on what challenges members are currently, or have historically faced.  Counselor also discussed strategies for improving work/life balance while members work on their mental health during treatment.  Some of these included keeping track of time management; creating a list of priorities and scaling importance; setting realistic, measurable goals each day; establishing boundaries; taking care of health needs; and nurturing relationships at home and work for support.  Counselor inquired about areas where members feel they are excelling, as well as areas they could focus on during treatment. Intervention was effective, as evidenced by Tresa Endo actively participating in discussion on topic and reporting that before she went on disability, she used to be a Production designer, theatre/television/film, and although she loved her job and staff most days, there were times when she recognized that she worked harder than other employees, and found that they got away with things, which made her resentful.  Willamae reported that she experienced several symptoms of burnout, including isolation from others, procrastination, and feeling overwhelmed.  Javanna reported that there were also numerous warning signs such as missing out on important social engagements, holidays, and other events with close supports.  Lashaundra was receptive to suggestions offered today for addressing work life imbalance should she ever decide to work part-time, including improving refusal skills to set boundaries with supports, and getting back into a regular exercise routine to get back into shape and ensure an outlet for stress.    Assessment and Plan:  Counselor recommends that [place] remain in IOP treatment to better manage mental health symptoms, ensure stability  and pursue completion of treatment plan goals. Counselor recommends adherence to crisis/safety plan, taking medications as prescribed, and following up with medical professionals if any issues arise.    Follow Up Instructions: Counselor will send Webex link for session tomorrow.  Berta was advised to call back or seek an in-person evaluation if the symptoms worsen or if the condition fails to improve as anticipated.   Collaboration of Care:   Medication Management AEB Dr. Eliseo Gum or Hillery Jacks, NP                                          Case Manager AEB Jeri Modena, CNA   Patient/Guardian was advised Release of Information must be obtained prior to any record release in order to collaborate their care with an outside provider. Patient/Guardian was advised if they have not already done so to contact the registration department to sign all necessary forms in order for [place] to release information regarding their care.   Consent: Patient/Guardian gives verbal consent for treatment and assignment of benefits for services provided during this visit. Patient/Guardian expressed understanding and agreed to proceed.  I provided 165 minutes of non-face-to-face time during this encounter.   Noralee Stain, LCSW, LCAS 01/09/23

## 2023-01-10 ENCOUNTER — Other Ambulatory Visit (HOSPITAL_COMMUNITY): Payer: Medicare HMO | Admitting: Psychiatry

## 2023-01-10 DIAGNOSIS — F331 Major depressive disorder, recurrent, moderate: Secondary | ICD-10-CM

## 2023-01-10 DIAGNOSIS — F411 Generalized anxiety disorder: Secondary | ICD-10-CM | POA: Diagnosis not present

## 2023-01-10 DIAGNOSIS — F332 Major depressive disorder, recurrent severe without psychotic features: Secondary | ICD-10-CM | POA: Diagnosis not present

## 2023-01-10 DIAGNOSIS — R4589 Other symptoms and signs involving emotional state: Secondary | ICD-10-CM | POA: Diagnosis not present

## 2023-01-10 NOTE — Progress Notes (Signed)
Virtual Visit via Video Note   I connected with Sharon Bowman on 01/10/23 at  9:00 AM EDT by a video enabled telemedicine application and verified that I am speaking with the correct person using two identifiers.   At orientation to the IOP program, Case Manager discussed the limitations of evaluation and management by telemedicine and the availability of in person appointments. The patient expressed understanding and agreed to proceed with virtual visits throughout the duration of the program.   Location:  Patient: Patient Home Provider: [facility]   History of Present Illness: MDD with anxious distress   Observations/Objective: Check In: Case Manager checked in with all participants to review discharge dates, insurance authorizations, work-related documents and needs from the treatment team regarding medications. Sharon Bowman stated needs and engaged in discussion.    Initial Therapeutic Activity: Counselor facilitated a check-in with Sharon Bowman to assess for safety, sobriety and medication compliance.  Counselor also inquired about Sharon Bowman's current emotional ratings, as well as any significant changes in thoughts, feelings or behavior since previous check in.  Sharon Bowman presented for session on time and was alert, oriented x5, with no evidence or self-report of active SI/HI or A/V H.  Sharon Bowman reported compliance with medication and denied use of alcohol or illicit substances.  Sharon Bowman reported scores of 3/10 for depression, 5/10 for anxiety, and 0/10 for anger/irritability.  Sharon Bowman denied any recent outbursts or panic attacks.  Sharon Bowman reported that a recent struggle was feeling overwhelmed yesterday as she was packing things up around the home, stating "I have to figure out what to keep, what to donate, and what to sell".  Sharon Bowman reported that her goal today is to continue packing up around the house.       Second Therapeutic Activity: Counselor introduced topic of grounding skills today.  Counselor defined these  as simple strategies one can use to help detach from difficult thoughts or feelings temporarily by focusing on something else.  Counselor noted that grounding will not solve the problem at hand, but can provide the practitioner with time to regain control over their thoughts and/or feelings and prevent the situation from getting worse (i.e. interrupting a panic attack).  Counselor divided these into three categories (mental, physical, and soothing) and then provided examples of each which group members could practice during session.  Some of these included describing one's environment in detail or playing a categories game with oneself for mental category, taking a hot bath/shower, stretching, or carrying a grounding object for physical category, and saying kind statements, or visualizing people one cares about for soothing category.  Counselor inquired about which techniques members have used with success in the past, or will commit to learning, practicing, and applying now to improve coping abilities.  Intervention was effective, as evidenced by [employer] participating in discussion on the subject, trying out several of the techniques during session, and expressing interest in adding several to her available coping skills, such as describing her environment in great detail, playing a categories game involving listing titles of books or authors, imagining herself relaxing at the beach, reading a book by her favorite Sharon Bowman, finding something funny on TV to watch that can make her laugh, counting to 50, running cool water over her hands, using a little turtle as a grounding object, reflecting on her favorite things like the summer season, treating herself to a nice [place] meal, reciting a quote from Mother Aggie Cosier that is inspiring, stretching, or telling herself to keep going and that she  is a 'badass'.    Assessment and Plan: Counselor recommends that Sharon Bowman remain in IOP treatment to better manage mental health  symptoms, ensure stability and pursue completion of treatment plan goals. Counselor recommends adherence to crisis/safety plan, taking medications as prescribed, and following up with medical professionals if any issues arise.    Follow Up Instructions: Counselor will send Webex link for session tomorrow.  [name] was advised to call back or seek an in-person evaluation if the symptoms worsen or if the condition fails to improve as anticipated.   Collaboration of Care:   Medication Management AEB Dr. Eliseo Gum or Hillery Jacks, NP                                          Case Manager AEB Jeri Modena, CNA   Patient/Guardian was advised Release of Information must be obtained prior to any record release in order to collaborate their care with an outside provider. Patient/Guardian was advised if they have not already done so to contact the registration department to sign all necessary forms in order for [place] to release information regarding their care.   Consent: Patient/Guardian gives verbal consent for treatment and assignment of benefits for services provided during this visit. Patient/Guardian expressed understanding and agreed to proceed.  I provided 180 minutes of non-face-to-face time during this encounter.   Noralee Stain, LCSW, LCAS 01/10/23

## 2023-01-11 ENCOUNTER — Other Ambulatory Visit (HOSPITAL_COMMUNITY): Payer: Medicare HMO | Admitting: Licensed Clinical Social Worker

## 2023-01-11 DIAGNOSIS — F331 Major depressive disorder, recurrent, moderate: Secondary | ICD-10-CM

## 2023-01-11 DIAGNOSIS — R4589 Other symptoms and signs involving emotional state: Secondary | ICD-10-CM | POA: Diagnosis not present

## 2023-01-11 DIAGNOSIS — F332 Major depressive disorder, recurrent severe without psychotic features: Secondary | ICD-10-CM | POA: Diagnosis not present

## 2023-01-11 DIAGNOSIS — F411 Generalized anxiety disorder: Secondary | ICD-10-CM | POA: Diagnosis not present

## 2023-01-11 NOTE — Progress Notes (Signed)
Virtual Visit via Video Note   I connected with Sharon Bowman on 01/11/23 at  9:00 AM EDT by a video enabled telemedicine application and verified that I am speaking with the correct person using two identifiers.   At orientation to the IOP program, Case Manager discussed the limitations of evaluation and management by telemedicine and the availability of in person appointments. The patient expressed understanding and agreed to proceed with virtual visits throughout the duration of the program.   Location:  Patient: Patient Home Provider: Clinical Home Office   History of Present Illness: MDD with anxious distress   Observations/Objective: Check In: Case Manager checked in with all participants to review discharge dates, insurance authorizations, work-related documents and needs from the treatment team regarding medications. Sharon Bowman stated needs and engaged in discussion.    Initial Therapeutic Activity: Counselor facilitated a check-in with Sharon Bowman to assess for safety, sobriety and medication compliance.  Counselor also inquired about Sharon Bowman's current emotional ratings, as well as any significant changes in thoughts, feelings or behavior since previous check in.  Sharon Bowman presented for session on time and was alert, oriented x5, with no evidence or self-report of active SI/HI or A/V H.  Sharon Bowman reported compliance with medication and denied use of alcohol or illicit substances.  Sharon Bowman reported scores of 4/10 for depression, 6/10 for anxiety, and 0/10 for anger/irritability.  Sharon Bowman denied any recent outbursts or panic attacks.  Sharon Bowman reported that a recent success was reading a book for self-care yesterday in-between packing up things in her home.  Sharon Bowman reported that a recent struggle has been dealing with physical pain due to the rainy weather.  Sharon Bowman reported that her goal this weekend is to continue packing around the home.       Second Therapeutic Activity: Counselor introduced topic of assertive  communication today.  Counselor shared various handouts with members virtually in group to read along with on the subject.  These handouts defined assertive communication as a communication style in which a person stands up for their own needs and wants, while also taking into consideration the needs and wants of others, without behaving in a passive or aggressive way.  Traits of assertive communicators were highlighted such as using appropriate speaking volume, maintaining eye contact, using confident language, and avoiding interruption.  Members were also provided with tips on how to improve communication, including respecting oneself, expressing thoughts and feelings calmly, and saying "No" when necessary.  Members were given a variety of scenarios where they could practice using these tips to respond in an assertive manner.  Intervention was effective, as evidenced by Sharon Bowman participating in discussion on topic, reporting that she has a passive communication style due to traits such as prioritizing the needs of others, maintaining poor eye contact, and not expressing her own needs or wants.  Sharon Bowman reported that this has led to issues such as pushing herself too much physically when others need help, which can cause her fibromyalgia to flare up.  She stated "I push myself a lot because I don't want to seem like I'm not normal".  Sharon Bowman showed more effective use of assertive communication skills through engagement in roleplay activities.    Third Therapeutic Activity: Psycho-educational portion of group was provided by Sharon Bowman, Interior and spatial designer of community education with [employer].  Sharon Bowman provided information on history of her local agency, mission statement, and the variety of unique services offered which group members might find beneficial to engage in, including both virtual and in-person support  groups, as well as peer support program for mentoring.  Sharon Bowman offered time to answer member's  questions regarding services and encouraged them to consider utilizing these services to assist in working towards their individual wellness goals.  Intervention effectiveness could not be measured, as client did not participate.    Assessment and Plan: Counselor recommends that [place] remain in IOP treatment to better manage mental health symptoms, ensure stability and pursue completion of treatment plan goals. Counselor recommends adherence to crisis/safety plan, taking medications as prescribed, and following up with medical professionals if any issues arise.    Follow Up Instructions: Counselor will send Webex link for session tomorrow.  Sharon Bowman was advised to call back or seek an in-person evaluation if the symptoms worsen or if the condition fails to improve as anticipated.   Collaboration of Care:   Medication Management AEB Dr. Eliseo Gum or Hillery Jacks, NP                                          Case Manager AEB Jeri Modena, CNA   Patient/Guardian was advised Release of Information must be obtained prior to any record release in order to collaborate their care with an outside provider. Patient/Guardian was advised if they have not already done so to contact the registration department to sign all necessary forms in order for [place] to release information regarding their care.   Consent: Patient/Guardian gives verbal consent for treatment and assignment of benefits for services provided during this visit. Patient/Guardian expressed understanding and agreed to proceed.  I provided 180 minutes of non-face-to-face time during this encounter.   Noralee Stain, LCSW, LCAS 01/11/23

## 2023-01-14 ENCOUNTER — Other Ambulatory Visit (HOSPITAL_COMMUNITY): Payer: Medicare HMO | Admitting: Licensed Clinical Social Worker

## 2023-01-14 DIAGNOSIS — F411 Generalized anxiety disorder: Secondary | ICD-10-CM | POA: Diagnosis not present

## 2023-01-14 DIAGNOSIS — F332 Major depressive disorder, recurrent severe without psychotic features: Secondary | ICD-10-CM | POA: Diagnosis not present

## 2023-01-14 DIAGNOSIS — F331 Major depressive disorder, recurrent, moderate: Secondary | ICD-10-CM

## 2023-01-14 DIAGNOSIS — R4589 Other symptoms and signs involving emotional state: Secondary | ICD-10-CM | POA: Diagnosis not present

## 2023-01-14 NOTE — Progress Notes (Signed)
Virtual Visit via Video Note   I connected with [name] on 01/14/23 at  9:00 AM EDT by a video enabled telemedicine application and verified that I am speaking with the correct person using two identifiers.   At orientation to the IOP program, Case Manager discussed the limitations of evaluation and management by telemedicine and the availability of in person appointments. The patient expressed understanding and agreed to proceed with virtual visits throughout the duration of the program.   Location:  Patient: Patient Home Provider: [facility] Office   History of Present Illness: MDD with anxious distress   Observations/Objective: Check In: Case Manager checked in with all participants to review discharge dates, insurance authorizations, work-related documents and needs from the treatment team regarding medications. Kareem stated needs and engaged in discussion.    Initial Therapeutic Activity: Counselor facilitated a check-in with Tresa Endo to assess for safety, sobriety and medication compliance.  Counselor also inquired about Fusako's current emotional ratings, as well as any significant changes in thoughts, feelings or behavior since previous check in.  Baldwin presented for session on time and was alert, oriented x5, with no evidence or self-report of active SI/HI or A/V H.  Ryana reported compliance with medication and denied use of alcohol or illicit substances.  Taylour reported scores of 3/10 for depression, 5/10 for anxiety, and 0/10 for anger/irritability.  Aashika denied any recent outbursts or panic attacks.  Dimitria reported that a recent success was continuing to pack up around her home to prepare for her move.  She reported that she also read her book for self-care.  Julie denied any new struggles.  Harshitha reported that her goal today is to run some errands to get out of the house.       Second Therapeutic Activity: Counselor covered topic of core beliefs with group today.  Counselor  virtually shared a handout on the subject, which explained how everyone looks at the world differently, and two people can have the same experience, but have different interpretations of what happened.  Members were encouraged to think of these like sunglasses with different "shades" influencing perception towards positive or negative outcomes.  Examples of negative core beliefs were provided, such as "I'm unlovable", "I'm not good enough", and "I'm a bad person".  Members were asked to share which one(s) they could relate to, and then identify evidence which contradicts these beliefs.  Counselor also provided psychoeducation on positive affirmations today.  Counselor explained how these are positive statements which can be spoken out loud or recited mentally to challenge negative thoughts and/or core beliefs to improve mood and outlook each day.  Counselor shared a comprehensive list of affirmations virtually to members with different categories, including ones for health, confidence, success, and happiness.  Counselor invited members to look through this list and identify any which resonated with them, and practice saying them out loud with sincerity.  Intervention was effective, as evidenced by Tresa Endo successfully participating in discussion on the subject and reporting that she could relate to several negative core beliefs listed on the handout, such as "I am unworthy", "I am trapped", and "I am unlovable".  Kaleisha was able to successfully challenge the belief "Nothing ever goes right" by listing evidence which contradicted it, such as the fact that she has gotten better at finding things to be grateful for in her life, such as taking steps to move to the beach to be in a better environment, and getting out of a toxic relationship.  Inna also reported that she liked several of the positive affirmations listed, such as "Failure is great feedback", "I am not trying to fit in because I was born to stand out", and  "Its okay to say no because those that matter don't mind and those who mind don't matter".        Third Therapeutic Activity: Counselor offered to teach group members an ACT relaxation technique today to aid in managing difficult thoughts, feelings, urges, and sensations.  Counselor guided members through process of getting comfortable, achieving relaxing breathing rhythm, and then maintaining this throughout activity.  Counselor invited members to imagine a gently flowing stream in their mind with leaves floating upon it, and when any thoughts, feelings, urges, or sensations arose, good or bad, they were instructed to visualize placing them on these passing leaves over course of 15 minutes practice.  Intervention was effective, as evidenced by Tresa Endo successfully participating in activity and reporting that she found it calming and relaxing her.  Lataja reported that she would practice it again as a coping skills, stating "Water is always good.  Its like going to my happy place".    Assessment and Plan: Counselor recommends that [place] remain in IOP treatment to better manage mental health symptoms, ensure stability and pursue completion of treatment plan goals. Counselor recommends adherence to crisis/safety plan, taking medications as prescribed, and following up with medical professionals if any issues arise.    Follow Up Instructions: Counselor will send Webex link for session tomorrow.  Izzie was advised to call back or seek an in-person evaluation if the symptoms worsen or if the condition fails to improve as anticipated.   Collaboration of Care:   Medication Management AEB Dr. Cyndie Chime or Hillery Jacks, NP                                          Case Manager AEB Jeri Modena, CNA   Patient/Guardian was advised Release of Information must be obtained prior to any record release in order to collaborate their care with an outside provider. Patient/Guardian was advised if they have not already done so to  contact the registration department to sign all necessary forms in order for [place] to release information regarding their care.   Consent: Patient/Guardian gives verbal consent for treatment and assignment of benefits for services provided during this visit. Patient/Guardian expressed understanding and agreed to proceed.  I provided 180 minutes of non-face-to-face time during this encounter.   Noralee Stain, LCSW, LCAS 01/14/23

## 2023-01-15 ENCOUNTER — Other Ambulatory Visit (HOSPITAL_COMMUNITY): Payer: Medicare HMO | Admitting: Licensed Clinical Social Worker

## 2023-01-15 DIAGNOSIS — F331 Major depressive disorder, recurrent, moderate: Secondary | ICD-10-CM

## 2023-01-15 DIAGNOSIS — F411 Generalized anxiety disorder: Secondary | ICD-10-CM | POA: Diagnosis not present

## 2023-01-15 DIAGNOSIS — R4589 Other symptoms and signs involving emotional state: Secondary | ICD-10-CM | POA: Diagnosis not present

## 2023-01-15 DIAGNOSIS — F332 Major depressive disorder, recurrent severe without psychotic features: Secondary | ICD-10-CM | POA: Diagnosis not present

## 2023-01-15 NOTE — Progress Notes (Signed)
Virtual Visit via Video Note   I connected with [name] on 01/15/23 at  9:00 AM EDT by a video enabled telemedicine application and verified that I am speaking with the correct person using two identifiers.   At orientation to the IOP program, Case Manager discussed the limitations of evaluation and management by telemedicine and the availability of in person appointments. The patient expressed understanding and agreed to proceed with virtual visits throughout the duration of the program.   Location:  Patient: Patient Home Provider: Clinical Home Office   History of Present Illness: MDD with anxious distress   Observations/Objective: Check In: Case Manager checked in with all participants to review discharge dates, insurance authorizations, work-related documents and needs from the treatment team regarding medications. Jaylena stated needs and engaged in discussion.    Initial Therapeutic Activity: Counselor facilitated a check-in with Tresa Endo to assess for safety, sobriety and medication compliance.  Counselor also inquired about Evangelyn's current emotional ratings, as well as any significant changes in thoughts, feelings or behavior since previous check in.  [place] presented for session on time and was alert, oriented x5, with no evidence or self-report of active SI/HI or A/V H.  Brittini reported compliance with medication and denied use of alcohol or illicit substances.  Ciara reported scores of 3/10 for depression, 3/10 for anxiety, and 0/10 for anger/irritability.  Jennfier denied any recent outbursts or panic attacks.  Francenia reported that a recent success was getting more packing done and running errands yesterday.  Harlow reported that a recent struggle was dealing with the heat yesterday, which left her feeling drained once she got home.  Taje reported that her goal today is to do more packing.       Second Therapeutic Activity: Counselor introduced topic of self-care today.  Counselor  explained how this can be defined as the things one does to maintain good health and improve well-being.  Counselor provided members with a self-care assessment form to complete.  This handout featured various sub-categories of self-care, including physical, psychological/emotional, social, spiritual, and professional.  Members were asked to rank their engagement in the activities listed for each dimension on a scale of 1-3, with 1 indicating 'Poor', 2 indicating 'Ok', and 3 indicating 'Well'.  Counselor invited members to share results of their assessment, and inquired about which areas of self-care they are doing well in, as well as areas that require attention, and how they plan to begin addressing this during treatment.  Intervention was effective, as evidenced by Tresa Endo successfully completing initial 2 sections of assessment and actively engaging in discussion on subject, reporting that she is excelling in areas such as taking care of personal hygiene, wearing clothes that make her feel good, getting enough sleep, resting when sick, taking time off work, learning new things each day, going on vacations, doing comforting things, and finding reasons to laugh, but would benefit from focusing more on areas such as exercising regularly, participating in fun activities, participating in hobbies, and getting away from distractions.  Dovey reported that she would work to improve self-care deficits by taking walks or going to the swimming pool to get more exercise, setting healthier social media boundaries to avoid excessive use of her phone, and exploring new hobbies in free time like bowling, baking, or photography.    Assessment and Plan: Counselor recommends that [place] remain in IOP treatment to better manage mental health symptoms, ensure stability and pursue completion of treatment plan goals. Counselor recommends adherence to crisis/safety  plan, taking medications as prescribed, and following up with medical  professionals if any issues arise.    Follow Up Instructions: Counselor will send Webex link for session tomorrow.  Kineta was advised to call back or seek an in-person evaluation if the symptoms worsen or if the condition fails to improve as anticipated.   Collaboration of Care:   Medication Management AEB Dr. Cyndie Chime or Hillery Jacks, NP                                          Case Manager AEB Jeri Modena, CNA   Patient/Guardian was advised Release of Information must be obtained prior to any record release in order to collaborate their care with an outside provider. Patient/Guardian was advised if they have not already done so to contact the registration department to sign all necessary forms in order for [place] to release information regarding their care.   Consent: Patient/Guardian gives verbal consent for treatment and assignment of benefits for services provided during this visit. Patient/Guardian expressed understanding and agreed to proceed.  I provided 180 minutes of non-face-to-face time during this encounter.   Noralee Stain, LCSW, LCAS 01/15/23

## 2023-01-16 ENCOUNTER — Other Ambulatory Visit (HOSPITAL_COMMUNITY): Payer: Medicare HMO | Admitting: Licensed Clinical Social Worker

## 2023-01-16 DIAGNOSIS — F332 Major depressive disorder, recurrent severe without psychotic features: Secondary | ICD-10-CM | POA: Diagnosis not present

## 2023-01-16 DIAGNOSIS — F411 Generalized anxiety disorder: Secondary | ICD-10-CM | POA: Diagnosis not present

## 2023-01-16 DIAGNOSIS — R4589 Other symptoms and signs involving emotional state: Secondary | ICD-10-CM | POA: Diagnosis not present

## 2023-01-16 DIAGNOSIS — F331 Major depressive disorder, recurrent, moderate: Secondary | ICD-10-CM

## 2023-01-16 NOTE — Psych (Signed)
Virtual Visit via Video Note  I connected with Sharon Bowman on 01/07/23 at  9:00 AM EDT by a video enabled telemedicine application and verified that I am speaking with the correct person using two identifiers.  Location: Patient: patient's home Provider: clinical home office   I discussed the limitations of evaluation and management by telemedicine and the availability of in person appointments. The patient expressed understanding and agreed to proceed.  I discussed the assessment and treatment plan with the patient. The patient was provided an opportunity to ask questions and all were answered. The patient agreed with the plan and demonstrated an understanding of the instructions.   The patient was advised to call back or seek an in-person evaluation if the symptoms worsen or if the condition fails to improve as anticipated.  Pt was provided 240 minutes of non-face-to-face time during this encounter.   Donia Guiles, LCSW   [facility] PHP THERAPIST PROGRESS NOTE  Sharon Bowman [id number]  Session Time: 9:00 - 10:00  Participation Level: Active  Behavioral Response: CasualAlertDepressed  Type of Therapy: Individual Therapy  Treatment Goals addressed: Coping  Progress Towards Goals: Progressing  Interventions: CBT, DBT, Supportive, and Reframing  Summary: Sharon Bowman is a 47 y.o. female who presents with depression and anxiety symptoms.  Clinician led check-in regarding current stressors and situation, and review of patient completed daily inventory. Clinician utilized active listening and empathetic response and validated patient emotions. Clinician facilitated processing group on pertinent issues.?    Therapist Response:  Patient arrived within time allowed. Patient rates her mood at a 7.5 on a scale of 1-10 with 10 being best. Pt states she feels "okay." Pt states she slept 8 hours and ate 1x. Pt reports she continued to struggle with fibromyalgia pain throughout  the weekend. Pt reports slowing down in terms of her work on cleaning/packing her home due to the pain and that increased negative self talk because she was not productive. Pt reports working on mantras to challenge the negative thoughts. Patient able to process. Patient engaged in discussion.            Session Time: 10:00 am - 11:00 am   Participation Level: Active   Behavioral Response: CasualAlertDepressed   Type of Therapy: Individual Therapy   Treatment Goals addressed: Coping   Progress Towards Goals: Progressing   Interventions: CBT, DBT, Solution Focused, Strength-based, Supportive, and Reframing   Therapist Response: Cln led discussion on planning ahead as a way to mitigate anxiety. Cln utilized DBT radical acceptance, distress tolerance skills, and CBT thought challenging to inform discussion. Cln worked with pt to brainstorm things within their control to plan ahead to mitigate future anxiety.   Therapist Response:  Pt engaged in discussion and identified high anxiety about upcoming court date this Friday. Pt lists concern of seeing her ex and the outcome of the court session. Pt is able to process worries. Pt states she will ask her sisters to come with her for support, she will inform her lawyer that she is concerned about seeing her ex so they can be aware, and she identifies which coping skills she can use in the moment if she becomes overwhelmed.         Session Time: 11:00 -12:00   Participation Level: Active   Behavioral Response: CasualAlertDepressed   Type of Therapy: Individual Therapy   Treatment Goals addressed: Coping   Progress Towards Goals: Progressing   Interventions: CBT, DBT, Solution Focused, Strength-based, Supportive, and Reframing  Summary: Cln led discussion on next steps. Pt voiced multiple anxieties regarding next steps in treatment and how that coordinates with next steps in their lives. Cln reviewed the levels of treatment and the  step-down plans that can ease anxieties regarding preparations for being without a structured program. Cln utilized motivational interviewing to address barriers.     Therapist Response:  Pt engaged in discussion and shares anxiety with moving to IOP because of the new therapist and group members. Pt reports worry re: future employment, finances ,and [place]. Pt is able to state ways she has already incorporate new habits into her every day life and state how they will help her work towards these goals.           Session Time: 12:00 -1:00   Participation Level: Active   Behavioral Response: CasualAlertDepressed   Type of Therapy: Occupational Therapy   Treatment Goals addressed: Coping   Progress Towards Goals: Progressing   Interventions: Supportive; Psychoeducation   Summary: 12:00 - 12:50: Occupational Therapy group led by cln E. Hollan. 12:50 - 1:00 Clinician assessed for immediate needs, medication compliance and efficacy, and safety concerns.   Therapist Response: 12:00 - 12:50: See OT note 12:50 - 1:00 pm: At check-out, patient reports no immediate concerns. Patient demonstrates progress as evidenced by continued engagement and responsiveness to treatment. Patient denies SI/HI/self-harm thoughts at the end of group.     Suicidal/Homicidal: Nowithout intent/plan  Plan: Pt will discharge from PHP due to meeting treatment goals of stabilization post-hospitalization, decreased depression and anxiety symptoms, and increased ability to manage symptoms in a healthy manner. Pt will step down to IOP within this agency beginning 7/9. Pt and provider are aligned with discharge plans. Pt denies SI/HI at time of discharge.   Collaboration of Care: Medication Management AEB Almira Bar  Patient/Guardian was advised Release of Information must be obtained prior to any record release in order to collaborate their care with an outside provider. Patient/Guardian  was advised if they have not already done so to contact the registration department to sign all necessary forms in order for [place] to release information regarding their care.   Consent: Patient/Guardian gives verbal consent for treatment and assignment of benefits for services provided during this visit. Patient/Guardian expressed understanding and agreed to proceed.   Diagnosis: MDD (major depressive disorder), recurrent severe, without psychosis (HCC) [F33.2]    1. MDD (major depressive disorder), recurrent severe, without psychosis (HCC)   2. Anxiety state      Donia Guiles, LCSW
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
## 2023-01-16 NOTE — Progress Notes (Signed)
Virtual Visit via Video Note   I connected with Sharon Bowman on 01/16/23 at  9:00 AM EDT by a video enabled telemedicine application and verified that I am speaking with the correct person using two identifiers.   At orientation to the IOP program, Case Manager discussed the limitations of evaluation and management by telemedicine and the availability of in person appointments. The patient expressed understanding and agreed to proceed with virtual visits throughout the duration of the program.   Location:  Patient: Patient Home Provider: OPT BH Office   History of Present Illness: MDD with anxious distress   Observations/Objective: Check In: Case Manager checked in with all participants to review discharge dates, insurance authorizations, work-related documents and needs from the treatment team regarding medications. Sharon Bowman stated needs and engaged in discussion.    Initial Therapeutic Activity: Counselor facilitated a check-in with Sharon Bowman to assess for safety, sobriety and medication compliance.  Counselor also inquired about Sharon Bowman's current emotional ratings, as well as any significant changes in thoughts, feelings or behavior since previous check in.  Sharon Bowman presented for session on time and was alert, oriented x5, with no evidence or self-report of active SI/HI or A/V H.  Sharon Bowman reported compliance with medication and denied use of alcohol or illicit substances.  Sharon Bowman reported scores of 2/10 for depression, 4/10 for anxiety, and 0/10 for anger/irritability.  Sharon Bowman denied any recent outbursts or panic attacks.  Sharon Bowman reported that a recent struggle was not feeling well yesterday, which led her to rest most of the day.  Sharon Bowman reported that her goal today is [place]       Second Therapeutic Activity: Counselor introduced Sharon Bowman, [employer] Pharmacist, to provide psychoeducation on topic of medication compliance with members today.   Sharon Bowman provided psychoeducation on classes of medications such as antidepressants, antipsychotics, what symptoms they are intended to treat, and any side effects one might encounter while on a particular prescription.  Time was allowed for clients to ask any questions they might have of Sharon Bowman regarding this specialty.  Intervention effectiveness could not be measured, as client did not participate.    Third Therapeutic Activity: Counselor introduced Celine Mans, [employer], to provide psychoeducation on topic of nutrition with members today.  Sharon Bowman virtually shared a comprehensive PowerPoint presentation to guide discussion, which featured the various components of healthy living that influence one's wellbeing, including practicing mindfulness, staying physically active, calm in mood, well-rested, and more.  Sharon Bowman explained how good nutrition reinforces positive physical and mental health, and shared a video which explained how food intake affects brain functioning in particular.  Sharon Bowman provided advice on how to adjust diet in order to promote wellbeing during course of treatment, including achieving balanced daily intake along with regular exercise.  Sharon Bowman offered the 'plate method' as a tool for proper distribution of protein, grains, starches, vegetables, fruit, and low calorie drink, in addition to concept of 'mindful eating', and covering current Dietary Guidelines for Americans for more tips.  Sharon Bowman inquired about changes members would like to make to their nutrition in order to increase overall wellbeing based upon information shared today, and time was allowed to ask any questions they might have of Sharon Bowman regarding her specialty.  Intervention was effective, as evidenced by Sharon Bowman participating in discussion with speaker on the subject, reporting that one of her wellness goals is to include more fruits in her diet to improve health, such as pineapple,  grape, and strawberries.  Sharon Bowman also participated in a  stretching activity that could be added to her routine to increase physical activity.  Assessment and Plan: Counselor recommends that Sharon Bowman remain in IOP treatment to better manage mental health symptoms, ensure stability and pursue completion of treatment plan goals. Counselor recommends adherence to crisis/safety plan, taking medications as prescribed, and following up with medical professionals if any issues arise.    Follow Up Instructions: Counselor will send Webex link for session tomorrow.  Sharon Bowman was advised to call back or seek an in-person evaluation if the symptoms worsen or if the condition fails to improve as anticipated.   Collaboration of Care:   Medication Management AEB Dr. Cyndie Chime or Hillery Jacks, NP                                          Case Manager AEB Jeri Modena, CNA   Patient/Guardian was advised Release of Information must be obtained prior to any record release in order to collaborate their care with an outside provider. Patient/Guardian was advised if they have not already done so to contact the registration department to sign all necessary forms in order for [place] to release information regarding their care.   Consent: Patient/Guardian gives verbal consent for treatment and assignment of benefits for services provided during this visit. Patient/Guardian expressed understanding and agreed to proceed.  I provided 180 minutes of non-face-to-face time during this encounter.   Noralee Stain, LCSW, LCAS 01/16/23
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
## 2023-01-16 NOTE — Psych (Signed)
Virtual Visit via Video Note  I connected with [name] on 01/01/23 at  9:00 AM EDT by a video enabled telemedicine application and verified that I am speaking with the correct person using two identifiers.  Location: Patient: patient's home Provider: clinical home office   I discussed the limitations of evaluation and management by telemedicine and the availability of in person appointments. The patient expressed understanding and agreed to proceed.  I discussed the assessment and treatment plan with the patient. The patient was provided an opportunity to ask questions and all were answered. The patient agreed with the plan and demonstrated an understanding of the instructions.   The patient was advised to call back or seek an in-person evaluation if the symptoms worsen or if the condition fails to improve as anticipated.  Pt was provided 240 minutes of non-face-to-face time during this encounter.   Donia Guiles, LCSW   [facility] BH PHP THERAPIST PROGRESS NOTE  JACKELIN CORREIA [id number]  Session Time: 9:00 - 10:00  Participation Level: Active  Behavioral Response: CasualAlertDepressed  Type of Therapy: Group Therapy  Treatment Goals addressed: Coping  Progress Towards Goals: Progressing  Interventions: CBT, DBT, Supportive, and Reframing  Summary: Sharon Bowman is a 47 y.o. female who presents with depression and anxiety symptoms.  Clinician led check-in regarding current stressors and situation, and review of patient completed daily inventory. Clinician utilized active listening and empathetic response and validated patient emotions. Clinician facilitated processing group on pertinent issues.?    Therapist Response:  Patient arrived within time allowed. Patient rates her mood at a 9 on a scale of 1-10 with 10 being best. Pt states she feels "good." Pt states she slept 8 hours and ate 1x. Pt reports continued decline in food intake since moving back on her own. Pt  states making progress on cleaning/packing her closet. Pt shares feeling driven to keep packing her house because she wants to move and thinks that will help her situation. Pt states her energy is keeping up while working around the house, however states continued issues with her ankles/feet and having to stop before she wants to. Pt reports struggle with negative self-talk.  Patient able to process. Patient engaged in discussion.            Session Time: 10:00 am - 11:00 am   Participation Level: Active   Behavioral Response: CasualAlertDepressed   Type of Therapy: Group Therapy   Treatment Goals addressed: Coping   Progress Towards Goals: Progressing   Interventions: CBT, DBT, Solution Focused, Strength-based, Supportive, and Reframing   Therapist Response: Cln facilitated processing group around difficult relationships. Group members shared struggles they face in relationships. Cln brought in topics of self-esteem, CBT thought challenging, boundaries, and communication to aid growth.  Therapist Response:  Pt engaged in discussion and is able to process. Pt shared struggles with her mom and past romantic relationships.            Session Time: 11:00 -12:00   Participation Level: Active   Behavioral Response: CasualAlertDepressed   Type of Therapy: Group Therapy   Treatment Goals addressed: Coping   Progress Towards Goals: Progressing   Interventions: CBT, DBT, Solution Focused, Strength-based, Supportive, and Reframing   Summary: Cln led discussion on forgiveness. Group members shared ways in which they struggle with forgiveness and how it has hurt them. Cln provided space for group to process. Cln encouraged pt's to consider forgiveness as a journey to free themselves from something holding them back.  Therapist Response:   Pt engaged in discussion and is able to process.          Session Time: 12:00 -1:00   Participation Level: Active   Behavioral Response:  CasualAlertDepressed   Type of Therapy: Group therapy   Treatment Goals addressed: Coping   Progress Towards Goals: Progressing   Interventions: CBT, DBT, Solution Focused, Strength-based, Supportive, and Reframing   Summary: 12:00 - 12:50: Cln led discussion on CBT cognitive distortion: all or nothing thinking. Cln worked with group members to identify examples of all or nothing thinking and the consequences that can come. Group shared ways in which all or nothing thinking has been an issue for them and barriers to working on it.  12:50 - 1:00 Clinician assessed for immediate needs, medication compliance and efficacy, and safety concerns.   Therapist Response: 12:00 - 12:50: Pt engaged in discussion and reports understanding of all or nothing thinking.  12:50 - 1:00 pm: At check-out, patient reports no immediate concerns. Patient demonstrates progress as evidenced by continued engagement and responsiveness to treatment. Patient denies SI/HI/self-harm thoughts at the end of group.     Suicidal/Homicidal: Nowithout intent/plan  Plan: Pt will continue in PHP while working to stabilize post-hospitalization, decrease depression and anxiety symptoms, and increase ability to manage symptoms in a healthy manner.   Collaboration of Care: Medication Management AEB [place]ira Bar  Patient/Guardian was advised Release of Information must be obtained prior to any record release in order to collaborate their care with an outside provider. Patient/Guardian was advised if they have not already done so to contact the registration department to sign all necessary forms in order for [place] to release information regarding their care.   Consent: Patient/Guardian gives verbal consent for treatment and assignment of benefits for services provided during this visit. Patient/Guardian expressed understanding and agreed to proceed.   Diagnosis: MDD (major depressive disorder), recurrent severe, without psychosis (HCC)  [F33.2]    1. MDD (major depressive disorder), recurrent severe, without psychosis (HCC)   2. Anxiety state      Donia Guiles, LCSW

## 2023-01-16 NOTE — Psych (Signed)
Virtual Visit via Video Note  I connected with Sharon Bowman on 12/31/22 at  9:00 AM EDT by a video enabled telemedicine application and verified that I am speaking with the correct person using two identifiers.  Location: Patient: patient's home Provider: clinical home office   I discussed the limitations of evaluation and management by telemedicine and the availability of in person appointments. The patient expressed understanding and agreed to proceed.  I discussed the assessment and treatment plan with the patient. The patient was provided an opportunity to ask questions and all were answered. The patient agreed with the plan and demonstrated an understanding of the instructions.   The patient was advised to call back or seek an in-person evaluation if the symptoms worsen or if the condition fails to improve as anticipated.  Pt was provided 240 minutes of non-face-to-face time during this encounter.   Sharon Guiles, LCSW   [facility] PHP THERAPIST PROGRESS NOTE  Sharon Bowman [id number]  Session Time: 9:00 - 10:00  Participation Level: Active  Behavioral Response: CasualAlertDepressed  Type of Therapy: Group Therapy  Treatment Goals addressed: Coping  Progress Towards Goals: Progressing  Interventions: CBT, DBT, Supportive, and Reframing  Summary: Sharon Bowman is a 47 y.o. female who presents with depression and anxiety symptoms.  Clinician led check-in regarding current stressors and situation, and review of patient completed daily inventory. Clinician utilized active listening and empathetic response and validated patient emotions. Clinician facilitated processing group on pertinent issues.?    Therapist Response:  Patient arrived within time allowed. Patient rates her mood at a 9 on a scale of 1-10 with 10 being best. Pt states she feels "good." Pt states she slept 7 hours and ate 1x. Pt reports continued decline in food intake since moving back on her own. Pt  states it is difficult to remember/motivate herself to stop and eat. Pt reports she did yard work on Saturday and did a work shift on Sunday. Pt states she was anxious going back to work for the first time, however it went "okay."  Pt shares her feet are swelling after she is up moving around and she has to stop working around the house mid afternoon due to the pain. Cln expressed concern re: pt going full throttle with tasks without rest AEB her body telling her it's too much. Pt states she has an appt with her PCP coming up soon and will discuss the swelling. Patient able to process. Patient engaged in discussion.            Session Time: 10:00 am - 11:00 am   Participation Level: Active   Behavioral Response: CasualAlertDepressed   Type of Therapy: Group Therapy   Treatment Goals addressed: Coping   Progress Towards Goals: Progressing   Interventions: CBT, DBT, Solution Focused, Strength-based, Supportive, and Reframing   Therapist Response: Cln led processing group for pt's current struggles. Group members shared stressors and provided support and feedback. Cln brought in topics of boundaries, healthy relationships, and unhealthy thought processes to inform discussion.    Therapist Response:  Pt able to process and provide support to group.         Session Time: 11:00 -12:00   Participation Level: Active   Behavioral Response: CasualAlertDepressed   Type of Therapy: Group Therapy   Treatment Goals addressed: Coping   Progress Towards Goals: Progressing   Interventions: CBT, DBT, Solution Focused, Strength-based, Supportive, and Reframing   Summary: Cln introduced CBT and the way in which  it can provide context for addressing stumbling blocks. Group discussed "the problem is not the problem, the problem is how we're thinking about the problem" and tried to change perspective on current struggles.    Therapist Response:  Pt engaged in discussion and is able to attempt  reframing using CBT.          Session Time: 12:00 -1:00   Participation Level: Active   Behavioral Response: CasualAlertDepressed   Type of Therapy: Group therapy   Treatment Goals addressed: Coping   Progress Towards Goals: Progressing   Interventions: CBT, DBT, Solution Focused, Strength-based, Supportive, and Reframing   Summary: 12:00 - 12:50: Cln continued topic of CBT cognitive distortions and utilized handout "Unhealthy Thought Patterns "to review common examples of distorted thought to increase awareness of the distorted thoughts. 12:50 - 1:00 Clinician assessed for immediate needs, medication compliance and efficacy, and safety concerns.   Therapist Response: 12:00 - 12:50: Pt engaged in discussion and is able to make connections from her life to the distorted thoughts.   12:50 - 1:00 pm: At check-out, patient reports no immediate concerns. Patient demonstrates progress as evidenced by continued engagement and responsiveness to treatment. Patient denies SI/HI/self-harm thoughts at the end of group.     Suicidal/Homicidal: Nowithout intent/plan  Plan: Pt will continue in PHP while working to stabilize post-hospitalization, decrease depression and anxiety symptoms, and increase ability to manage symptoms in a healthy manner.   Collaboration of Care: Medication Management AEB Almira Bar  Patient/Guardian was advised Release of Information must be obtained prior to any record release in order to collaborate their care with an outside provider. Patient/Guardian was advised if they have not already done so to contact the registration department to sign all necessary forms in order for [place] to release information regarding their care.   Consent: Patient/Guardian gives verbal consent for treatment and assignment of benefits for services provided during this visit. Patient/Guardian expressed understanding and agreed to proceed.   Diagnosis: MDD (major depressive disorder),  recurrent severe, without psychosis (HCC) [F33.2]    1. MDD (major depressive disorder), recurrent severe, without psychosis (HCC)   2. Anxiety state      Sharon Guiles, LCSW

## 2023-01-17 ENCOUNTER — Other Ambulatory Visit (HOSPITAL_COMMUNITY): Payer: Medicare HMO

## 2023-01-17 ENCOUNTER — Other Ambulatory Visit (HOSPITAL_COMMUNITY): Payer: Medicare HMO | Attending: Psychiatry | Admitting: Licensed Clinical Social Worker

## 2023-01-17 ENCOUNTER — Ambulatory Visit (HOSPITAL_COMMUNITY): Payer: Medicare HMO | Admitting: Psychiatry

## 2023-01-17 DIAGNOSIS — Z87891 Personal history of nicotine dependence: Secondary | ICD-10-CM | POA: Insufficient documentation

## 2023-01-17 DIAGNOSIS — M797 Fibromyalgia: Secondary | ICD-10-CM | POA: Diagnosis not present

## 2023-01-17 DIAGNOSIS — F329 Major depressive disorder, single episode, unspecified: Secondary | ICD-10-CM | POA: Insufficient documentation

## 2023-01-17 DIAGNOSIS — F411 Generalized anxiety disorder: Secondary | ICD-10-CM | POA: Insufficient documentation

## 2023-01-17 DIAGNOSIS — F331 Major depressive disorder, recurrent, moderate: Secondary | ICD-10-CM

## 2023-01-17 NOTE — Progress Notes (Signed)
Virtual Visit via Video Note   I connected with Sharon Bowman on 01/17/23 at  9:00 AM EDT by a video enabled telemedicine application and verified that I am speaking with the correct person using two identifiers.   At orientation to the IOP program, Case Manager discussed the limitations of evaluation and management by telemedicine and the availability of in person appointments. The patient expressed understanding and agreed to proceed with virtual visits throughout the duration of the program.   Location:  Patient: Patient Home Provider: [facility]   History of Present Illness: MDD with anxious distress   Observations/Objective: Check In: Case Manager checked in with all participants to review discharge dates, insurance authorizations, work-related documents and needs from the treatment team regarding medications. Sharon Bowman stated needs and engaged in discussion.    Initial Therapeutic Activity: Counselor facilitated a check-in with Sharon Bowman to assess for safety, sobriety and medication compliance.  Counselor also inquired about Sharon Bowman's current emotional ratings, as well as any significant changes in thoughts, feelings or behavior since previous check in.  Sharon Bowman presented for session on time and was alert, oriented x5, with no evidence or self-report of active SI/HI or A/V H.  Sharon Bowman reported compliance with medication and denied use of alcohol or illicit substances.  Sharon Bowman reported scores of 2/10 for depression, 5/10 for anxiety, and 0/10 for anger/irritability.  Sharon Bowman denied any recent outbursts or panic attacks.  Sharon Bowman reported that a recent success was getting more packing done around her home, as she is almost done boxing up her items.  Sharon Bowman reported that a recent struggle was getting overwhelmed yesterday while packing, stating "I just got in this mood where I didn't want to do anything else".  Sharon Bowman reported that her goal today is to take a hot bath for self-care this afternoon.       Second  Therapeutic Activity: Counselor utilized a Cabin crew with group members today to guide discussion on topic of codependency.  This handout defined codependency as excessive emotional or psychological reliance upon someone who requires support on account of an illness or addiction.  It also explained how this issue presents in dysfunctional family systems, including behavior such as denying existence of problems, rigid boundaries on communication, strained trust, lack of individuality, and reinforcement of unhealthy coping mechanisms such as substance use.  Characteristics of co-dependent people were listed for assistance with identification, such as extreme need for approval/recognition, difficulty identifying feelings, poor communication, and more.  Members were also tasked with completing a questionnaire in order to identify signs of codependency and results were discussed afterward.  This handout also offered strategies for resolving co-dependency within one's network, including increased use of assertive communication skills in order to set appropriate boundaries.  Intervention was effective, as evidenced by Sharon Bowman actively participating in discussion on the subject, and completing codependency questionnaire, with 13 out of 20 positive responses.  Sharon Bowman reported that she struggled with codependency growing up because of her mother, who she felt was a narcissist and created a one sided relationship.  Sharon Bowman stated "My mom denied and ignored difficult emotions.  IT was her way or no way.  She was always right, and living with her was very hard".  She reported that her goal will be to work on becoming more assertive so that she can say "No" when she needs to without guilt and set healthier boundaries with people like her mom.    Assessment and Plan: Counselor recommends that Sharon Bowman remain in IOP  treatment to better manage mental health symptoms, ensure stability and pursue completion of treatment plan goals.  Counselor recommends adherence to crisis/safety plan, taking medications as prescribed, and following up with medical professionals if any issues arise.    Follow Up Instructions: Counselor will send Webex link for session tomorrow.  Sharon Bowman was advised to call back or seek an in-person evaluation if the symptoms worsen or if the condition fails to improve as anticipated.   Collaboration of Care:   Medication Management AEB Dr. Cyndie Chime or Hillery Jacks, NP                                          Case Manager AEB Jeri Modena, CNA   Patient/Guardian was advised Release of Information must be obtained prior to any record release in order to collaborate their care with an outside provider. Patient/Guardian was advised if they have not already done so to contact the registration department to sign all necessary forms in order for [place] to release information regarding their care.   Consent: Patient/Guardian gives verbal consent for treatment and assignment of benefits for services provided during this visit. Patient/Guardian expressed understanding and agreed to proceed.  I provided 180 minutes of non-face-to-face time during this encounter.   Noralee Stain, LCSW, LCAS 01/17/23

## 2023-01-18 ENCOUNTER — Other Ambulatory Visit (HOSPITAL_COMMUNITY): Payer: Medicare HMO

## 2023-01-21 ENCOUNTER — Other Ambulatory Visit (HOSPITAL_COMMUNITY): Payer: Medicare HMO | Admitting: Psychiatry

## 2023-01-21 ENCOUNTER — Other Ambulatory Visit (HOSPITAL_COMMUNITY): Payer: Medicare HMO

## 2023-01-21 DIAGNOSIS — M797 Fibromyalgia: Secondary | ICD-10-CM | POA: Diagnosis not present

## 2023-01-21 DIAGNOSIS — Z87891 Personal history of nicotine dependence: Secondary | ICD-10-CM | POA: Diagnosis not present

## 2023-01-21 DIAGNOSIS — F331 Major depressive disorder, recurrent, moderate: Secondary | ICD-10-CM

## 2023-01-21 DIAGNOSIS — F411 Generalized anxiety disorder: Secondary | ICD-10-CM | POA: Diagnosis not present

## 2023-01-21 DIAGNOSIS — F329 Major depressive disorder, single episode, unspecified: Secondary | ICD-10-CM | POA: Diagnosis not present

## 2023-01-21 MED ORDER — DULOXETINE HCL 60 MG PO CPEP: 120.0000 mg | ORAL_CAPSULE | Freq: Every day | ORAL | 0 refills | Status: DC

## 2023-01-21 NOTE — Progress Notes (Signed)
Virtual Visit via Video Note   I connected with Sharon Bowman on 01/21/23 at  9:00 AM EDT by a video enabled telemedicine application and verified that I am speaking with the correct person using two identifiers.   At orientation to the IOP program, Case Manager discussed the limitations of evaluation and management by telemedicine and the availability of in person appointments. The patient expressed understanding and agreed to proceed with virtual visits throughout the duration of the program.   Location:  Patient: Patient Home Provider: [facility]   History of Present Illness: MDD with anxious distress   Observations/Objective: Check In: Case Manager checked in with all participants to review discharge dates, insurance authorizations, work-related documents and needs from the treatment team regarding medications. Sharon Bowman stated needs and engaged in discussion.    Initial Therapeutic Activity: Counselor facilitated a check-in with Sharon Bowman to assess for safety, sobriety and medication compliance.  Counselor also inquired about Sharon Bowman's current emotional ratings, as well as any significant changes in thoughts, feelings or behavior since previous check in.  Sharon Bowman presented for session on time and was alert, oriented x5, with no evidence or self-report of active SI/HI or A/V H.  Sharon Bowman reported compliance with medication and denied use of alcohol or illicit substances.  Sharon Bowman reported scores of 3/10 for depression, 6/10 for anxiety, and 0/10 for anger/irritability.  Sharon Bowman denied any recent outbursts or panic attacks.  Sharon Bowman reported that a recent success was taking time to rest this weekend since she spent so much time packing throughout the week.  Sharon Bowman reported that a recent struggle is dealing with anxiety and pain related to rainy, cooler weather.  Sharon Bowman reported that her goal today is to finish packing for her move to the beach.       Second Therapeutic Activity: Counselor introduced topic of  self-esteem today and defined this as the value an individual places on oneself, based upon assessment of personal worth as a human being and approval/disapproval of one's behavior. Counselor asked members to assess their level of self-esteem at this time based upon common indicators of high self-esteem, including: accepting oneself unconditionally;  having self-respect and deep seated belief that one matters; being unaffected by other people's opinions/criticisms; and showing good control over emotions.  Counselor also explained concept of one's inner critic which serves to highlight faults and minimize strengths, directly influencing low sense of self-esteem.  Counselor then provided handout on 'strengths and qualities', which featured questions to guide discussion and increase awareness of each member's unique individual abilities which could reinforce higher self-esteem. Examples of questions included: 'things I am good at', 'challenges I have overcome', and 'what I like about myself'.  Intervention was effective, as evidenced by Sharon Bowman actively engaging in discussion on topic, and completing a self-esteem assessment, receiving a score of 18, which indicated a 'good' level of self-esteem at this time due to traits such as tolerate greater levels of frustration, and [place] environment.  Sharon Bowman reported that she would benefit from taking more calculated risks, giving herself credit for persona skills/abilities, and embracing her unique personality.  Sharon Bowman stated "I think the hardest thing is dealing with fibromyalgia.  Its very frustrating, things are slower with it".  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  Assessment and Plan: Counselor recommends that Sharon Bowman remain in IOP treatment to better manage mental health symptoms, ensure stability and pursue completion of treatment plan goals. Counselor recommends adherence to crisis/safety plan, taking medications as prescribed, and following up with medical professionals if any issues arise.    Follow Up Instructions: Counselor will send Webex link for session tomorrow.  Sharon Bowman was advised to call back or seek an in-person evaluation if the symptoms worsen or if the condition fails to improve as anticipated.   Collaboration of Care:   Medication Management AEB Dr. Cyndie Chime or Hillery Jacks, NP                                          Case Manager AEB Jeri Modena, CNA   Patient/Guardian was advised Release of Information must be obtained prior to any record release in order to collaborate their care with an outside provider. Patient/Guardian was advised if they have not already done so to contact the registration department to sign all necessary forms in order for [place] to release information regarding their care.   Consent: Patient/Guardian gives verbal consent for treatment and assignment of benefits for services provided during this visit. Patient/Guardian expressed understanding and agreed to proceed.  I provided 180 minutes of non-face-to-face time during this encounter.   Sharon Stain, LCSW, LCAS 01/21/23
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
## 2023-01-22 ENCOUNTER — Other Ambulatory Visit (HOSPITAL_COMMUNITY): Payer: Medicare HMO

## 2023-01-22 ENCOUNTER — Other Ambulatory Visit (HOSPITAL_COMMUNITY): Payer: Medicare HMO | Admitting: Licensed Clinical Social Worker

## 2023-01-22 DIAGNOSIS — F411 Generalized anxiety disorder: Secondary | ICD-10-CM | POA: Diagnosis not present

## 2023-01-22 DIAGNOSIS — Z87891 Personal history of nicotine dependence: Secondary | ICD-10-CM | POA: Diagnosis not present

## 2023-01-22 DIAGNOSIS — M797 Fibromyalgia: Secondary | ICD-10-CM | POA: Diagnosis not present

## 2023-01-22 DIAGNOSIS — F329 Major depressive disorder, single episode, unspecified: Secondary | ICD-10-CM | POA: Diagnosis not present

## 2023-01-22 DIAGNOSIS — F331 Major depressive disorder, recurrent, moderate: Secondary | ICD-10-CM

## 2023-01-22 NOTE — Progress Notes (Signed)
Virtual Visit via Video Note   I connected with Sharon Bowman on 01/22/23 at  9:00 AM EDT by a video enabled telemedicine application and verified that I am speaking with the correct person using two identifiers.   At orientation to the IOP program, Case Manager discussed the limitations of evaluation and management by telemedicine and the availability of in person appointments. The patient expressed understanding and agreed to proceed with virtual visits throughout the duration of the program.   Location:  Patient: Patient Home Provider: OPT BH Office   History of Present Illness: MDD with anxious distress   Observations/Objective: Check In: Case Manager checked in with all participants to review discharge dates, insurance authorizations, work-related documents and needs from the treatment team regarding medications. Sharon Bowman stated needs and engaged in discussion.    Initial Therapeutic Activity: Counselor facilitated a check-in with Sharon Bowman to assess for safety, sobriety and medication compliance.  Counselor also inquired about Sharon Bowman's current emotional ratings, as well as any significant changes in thoughts, feelings or behavior since previous check in.  Sharon Bowman presented for session on time and was alert, oriented x5, with no evidence or self-report of active SI/HI or A/V H.  Sharon Bowman reported compliance with medication and denied use of alcohol or illicit substances.  Sharon Bowman reported scores of 3/10 for depression, 4/10 for anxiety, and 0/10 for anger/irritability.  Sharon Bowman denied any recent outbursts or panic attacks.  Sharon Bowman reported that a recent success was finishing packing up for her [place] to the beach.  Sharon Bowman denied any new struggles.  Sharon Bowman reported that her goal today is to begin separating and pricing items for her upcoming yard sale.       Second Therapeutic Activity: Counselor introduced [employer], [place] Chaplain to provide psychoeducation on topic of Grief and Loss with members today.   Sharon Bowman began discussion by checking in with the group about their baseline mood today, general thoughts on what grief means to them and how it has affected them personally in the past.  Sharon Bowman provided information on how the process of grief/loss can differ depending upon one's unique culture, and categories of loss one could experience (i.e. loss of a person, animal, relationship, job, identity, etc).  Sharon Bowman encouraged members to be mindful of how pervasive loss can be, and how to recognize signs which could indicate that this is having an impact on one's overall mental health and wellbeing.  Intervention was effective, as evidenced by Sharon Bowman participating in discussion with speaker on the subject, reporting that she is continuing to process the unexpected loss of her father around Christmas in 2019.  Sharon Bowman reported that she is hoping her move to the [employer] will give her something to look forward to, and avoid excessive rumination on the loss.   Third Therapeutic Activity: Counselor introduced topic of building a social support network today.  Counselor explained how this can be defined as having a having a group of healthy people in one's life you can talk to, spend time with, and get help from to improve both mental and physical health.  Counselor noted that some barriers can make it difficult to connect with other people, including the presence of anxiety or depression, or moving to an unfamiliar area.  Group members were asked to assess the current state of their support network, and identify ways that this could be improved.  Tips were given on how to address previously noted barriers, such as strengthening social skills, using relaxation techniques to reduce  anxiety, scheduling social time each week, and/or exploring social events nearby which could increase chances of meeting new supports.  Members were also encouraged to consider getting closer to people they already know through suggestions such as  outreaching someone by text, email or phone call if they haven't spoken in awhile, doing something nice for a friend/family member unexpectedly, and/or inviting someone over for a game/movie/dinner night.  Intervention was effective, as evidenced by Sharon Bowman actively participating in discussion on the subject, and reporting that she has been more isolated in recent years due to diagnosis of fibromyalgia.  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  I used to, but it's a struggle trying to explain to people how I feel.  I have to wear a mask".  Sharon Bowman reported that because she is moving to the [employer], she will have a new start, and is interested in reconnecting with a friend out there who knows the area and has been supportive before.  Sharon Bowman also expressed interested in looking into volunteer opportunities such as linking with the [employer], since she enjoyed this in the past and took part in several events.  Sharon Bowman stated "I used to be a real social butterfly".    Assessment and Plan: Counselor recommends that Sharon Bowman remain in IOP treatment to better manage mental health symptoms, ensure stability and pursue completion of treatment plan goals. Counselor recommends adherence to crisis/safety plan, taking medications as prescribed, and following up with medical professionals if any issues arise.    Follow Up Instructions: Counselor will send Webex link for session tomorrow.  Sharon Bowman was advised to call back or seek an in-person evaluation if the symptoms worsen or if the condition fails to improve as anticipated.   Collaboration of Care:   Medication Management AEB Dr. Cyndie Chime or Hillery Jacks, NP                                          Case Manager AEB Sharon [place]ena, CNA   Patient/Guardian was advised Release of Information must be obtained prior to any record release in order to collaborate their care with an outside provider. Patient/Guardian was advised if they have not  already done so to contact the registration department to sign all necessary forms in order for [place] to release information regarding their care.   Consent: Patient/Guardian gives verbal consent for treatment and assignment of benefits for services provided during this visit. Patient/Guardian expressed understanding and agreed to proceed.  I provided 180 minutes of non-face-to-face time during this encounter.   Noralee Stain, LCSW, LCAS 01/22/23

## 2023-01-23 ENCOUNTER — Other Ambulatory Visit (HOSPITAL_COMMUNITY): Payer: Medicare HMO | Admitting: Licensed Clinical Social Worker

## 2023-01-23 ENCOUNTER — Other Ambulatory Visit (HOSPITAL_COMMUNITY): Payer: Medicare HMO

## 2023-01-23 DIAGNOSIS — Z87891 Personal history of nicotine dependence: Secondary | ICD-10-CM | POA: Diagnosis not present

## 2023-01-23 DIAGNOSIS — F411 Generalized anxiety disorder: Secondary | ICD-10-CM | POA: Diagnosis not present

## 2023-01-23 DIAGNOSIS — M797 Fibromyalgia: Secondary | ICD-10-CM | POA: Diagnosis not present

## 2023-01-23 DIAGNOSIS — F329 Major depressive disorder, single episode, unspecified: Secondary | ICD-10-CM | POA: Diagnosis not present

## 2023-01-23 DIAGNOSIS — F331 Major depressive disorder, recurrent, moderate: Secondary | ICD-10-CM

## 2023-01-23 NOTE — Progress Notes (Signed)
Virtual Visit via Video Note   I connected with Sharon Bowman on 01/23/23 at  9:00 AM EDT by a video enabled telemedicine application and verified that I am speaking with the correct person using two identifiers.   At orientation to the IOP program, Case Manager discussed the limitations of evaluation and management by telemedicine and the availability of in person appointments. The patient expressed understanding and agreed to proceed with virtual visits throughout the duration of the program.   Location:  Patient: Patient Home Provider: [facility]   History of Present Illness: MDD with anxious distress   Observations/Objective: Check In: Case Manager checked in with all participants to review discharge dates, insurance authorizations, work-related documents and needs from the treatment team regarding medications. Sharon Bowman stated needs and engaged in discussion.    Initial Therapeutic Activity: Counselor facilitated a check-in with Sharon Bowman to assess for safety, sobriety and medication compliance.  Counselor also inquired about Sharon Bowman's current emotional ratings, as well as any significant changes in thoughts, feelings or behavior since previous check in.  Sharon Bowman presented for session on time and was alert, oriented x5, with no evidence or self-report of active SI/HI or A/V H.  Sharon Bowman reported compliance with medication and denied use of alcohol or illicit substances.  Sharon Bowman reported scores of 3/10 for depression, 6/10 for anxiety, and 0/10 for anger/irritability.  Sharon Bowman denied any recent outbursts or panic attacks.  Sharon Bowman reported that a recent success was watching a movie yesterday for self-care.  Sharon Bowman denied any new struggles.  Sharon Bowman reported that her goal today is to paint her nails for self-care.       Second Therapeutic Activity: Counselor introduced topic of stress management today.  Counselor provided definition of stress as feeling tense, overwhelmed, worn out, and/or exhausted, and noted  that in small amounts, stress can be motivating until things become too overwhelming to manage.  Counselor also explained how stress can be acute (brief but intense) or chronic (long-lasting) and this can impact the severity of symptoms one can experience in the physical, emotional, and behavioral categories.  Counselor inquired about members' specific stressors, how long they have been prevalent, and the various symptoms that tend to manifest as a result.  Counselor also offered several stress management strategies to help improve members' coping ability, including journaling, gratitude practice, relaxation techniques, and time management tips.  Counselor also explained that research has shown a strong support network composed of trusted family, friends, or community members can increase resilience in times of stress, and inquired about who members can reach out to for help in managing stressors.  Counselor encouraged members to consider discussing stressor 'red flags' with their close supports that can be monitored and strategies for assisting them in times of crisis.  Intervention was effective, as evidenced by Sharon Bowman actively participating in discussion on subject, reporting that her most significant stressors include physical pain from her fibromyalgia, money worries, changing jobs, and the world economy.  Sharon Bowman was able to identify several warning signs related to stress, including body aches, dissociation, fatigue, headaches, teeth grinding, crying, and forgetfulness.   Sharon Bowman reported that her stress management goal is to prioritize her physical health more by going on walks, stretching, and practicing deep breathing to cope with her physical pain from fibromyalgia, stating "It would calm down my stress if my body feels better".  Sharon Bowman also expressed receptiveness to several stress management strategies practiced today in session, including practicing a deep breathing exercise, and engaging in progressive  muscle relaxation activity.     Assessment and Plan: Counselor recommends that Sharon Bowman remain in IOP treatment to better manage mental health symptoms, ensure stability and pursue completion of treatment plan goals. Counselor recommends adherence to crisis/safety plan, taking medications as prescribed, and following up with medical professionals if any issues arise.    Follow Up Instructions: Counselor will send Webex link for session tomorrow.  Sharon Bowman was advised to call back or seek an in-person evaluation if the symptoms worsen or if the condition fails to improve as anticipated.   Collaboration of Care:   Medication Management AEB Dr. Cyndie Chime or Hillery Jacks, NP                                          Case Manager AEB Jeri Modena, CNA   Patient/Guardian was advised Release of Information must be obtained prior to any record release in order to collaborate their care with an outside provider. Patient/Guardian was advised if they have not already done so to contact the registration department to sign all necessary forms in order for [place] to release information regarding their care.   Consent: Patient/Guardian gives verbal consent for treatment and assignment of benefits for services provided during this visit. Patient/Guardian expressed understanding and agreed to proceed.  I provided 180 minutes of non-face-to-face time during this encounter.   Noralee Stain, LCSW, LCAS 01/23/23

## 2023-01-24 ENCOUNTER — Other Ambulatory Visit (HOSPITAL_COMMUNITY): Payer: Medicare HMO

## 2023-01-25 ENCOUNTER — Other Ambulatory Visit (HOSPITAL_COMMUNITY): Payer: Medicare HMO

## 2023-01-25 ENCOUNTER — Other Ambulatory Visit (HOSPITAL_COMMUNITY): Payer: Medicare HMO | Admitting: Licensed Clinical Social Worker

## 2023-01-25 DIAGNOSIS — F329 Major depressive disorder, single episode, unspecified: Secondary | ICD-10-CM | POA: Diagnosis not present

## 2023-01-25 DIAGNOSIS — F331 Major depressive disorder, recurrent, moderate: Secondary | ICD-10-CM

## 2023-01-25 DIAGNOSIS — M797 Fibromyalgia: Secondary | ICD-10-CM | POA: Diagnosis not present

## 2023-01-25 DIAGNOSIS — F411 Generalized anxiety disorder: Secondary | ICD-10-CM | POA: Diagnosis not present

## 2023-01-25 DIAGNOSIS — Z87891 Personal history of nicotine dependence: Secondary | ICD-10-CM | POA: Diagnosis not present
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
## 2023-01-28 ENCOUNTER — Other Ambulatory Visit (HOSPITAL_COMMUNITY): Payer: Medicare HMO

## 2023-01-28 ENCOUNTER — Other Ambulatory Visit (HOSPITAL_COMMUNITY): Payer: Medicare HMO | Admitting: Psychiatry

## 2023-01-28 DIAGNOSIS — F329 Major depressive disorder, single episode, unspecified: Secondary | ICD-10-CM | POA: Diagnosis not present

## 2023-01-28 DIAGNOSIS — F331 Major depressive disorder, recurrent, moderate: Secondary | ICD-10-CM

## 2023-01-28 DIAGNOSIS — M797 Fibromyalgia: Secondary | ICD-10-CM | POA: Diagnosis not present

## 2023-01-28 DIAGNOSIS — F411 Generalized anxiety disorder: Secondary | ICD-10-CM | POA: Diagnosis not present

## 2023-01-28 DIAGNOSIS — Z87891 Personal history of nicotine dependence: Secondary | ICD-10-CM | POA: Diagnosis not present
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
## 2023-01-28 NOTE — Progress Notes (Signed)
Virtual Visit via Video Note   I connected with [name] on 01/28/23 at  9:00 AM EDT by a video enabled telemedicine application and verified that I am speaking with the correct person using two identifiers.   At orientation to the IOP program, Case Manager discussed the limitations of evaluation and management by telemedicine and the availability of in person appointments. The patient expressed understanding and agreed to proceed with virtual visits throughout the duration of the program.   Location:  Patient: Patient Home Provider: [facility]   History of Present Illness: MDD with anxious distress   Observations/Objective: Check In: Case Manager checked in with all participants to review discharge dates, insurance authorizations, work-related documents and needs from the treatment team regarding medications. Samrawit stated needs and engaged in discussion.    Initial Therapeutic Activity: Counselor facilitated a check-in with Tresa Endo to assess for safety, sobriety and medication compliance.  Counselor also inquired about Carlei's current emotional ratings, as well as any significant changes in thoughts, feelings or behavior since previous check in.  Lampeter presented for session on time and was alert, oriented x5, with no evidence or self-report of active SI/HI or A/V H.  Kinzlie reported compliance with medication and denied use of alcohol or illicit substances.  Leihla reported scores of 3/10 for depression, 4/10 for anxiety, and 0/10 for anger/irritability.  Dequana denied any recent outbursts or panic attacks.  Angelita reported that a recent success was having a yard sale at her home over the weekend, although this tired her out.  Shizuye reported that a recent struggle was dealing with pain due to her fibromyalgia, which can be impacted by rainy weather.  Taylee reported that her goal today is to take time to rest and avoid overexerting herself.       Second Therapeutic Activity: Counselor  continued discussion upon topic of self-care today with group members.  Counselor virtually shared self-care assessment form with members via Webex to complete final sub-categories (i.e. social, spiritual, and professional).  Members were asked to rank their engagement in the activities listed for each dimension on a scale of 1-3, with 1 indicating 'Poor', 2 indicating 'Ok', and 3 indicating 'Well'.  Counselor invited members to share results of this assessment, and inquired about which areas of self-care they are doing well in, as well as areas that require attention, and how they plan to begin addressing this during treatment.  Intervention was effective, as evidenced by Tresa Endo successfully completing final sections of assessment and actively engaging in discussion on subject, reporting that she is excelling in areas such as having stimulating conversations, doing enjoyable activities with others, spending time in nature, praying daily, recognizing things that give meaning to life, acting in accordance with morals and values, maintaining balance between professional and personal life, and keeping a comfortable workspace, but would benefit from focusing more on areas such as meeting new people, asking others for help when needed, calling or writing friends that live far away, setting aside time for thought and reflection, appreciating art that is impactful for her, saying "No" to excessive new responsibilities, and taking breaks.  Kinsey reported that she would work to improve self-care deficits by looking for people with positive qualities that understand her physical health needs and respect her boundaries; being mindful of warning signs that could indicate she needs help, such as worsening depression, anxiety, or apathy; linking with old friends that live at the [employer] and can refamiliarize herself with the area when  she moves; setting a boundary to ensure quiet time away from her phone at night before bed;  taking time to listen to inspiring music each day that lifts her spirit;  setting an alarm to reminder herself to take breaks at work; using therapy as an opportunity to continue building assertive communication skills so that she can set healthier boundaries.      Assessment and Plan: Kamberlyn will be discharged from [facility] today.  Counselor recommends adherence to crisis/safety plan, taking medications as prescribed, and following up with medical professionals if any issues arise.    Follow Up Instructions: Ascencion was advised to call back or seek an in-person evaluation if the symptoms worsen or if the condition fails to improve as anticipated.   Collaboration of Care:   Medication Management AEB Dr. Cyndie Chime or Hillery Jacks, NP                                          Case Manager AEB Jeri Modena, CNA   Patient/Guardian was advised Release of Information must be obtained prior to any record release in order to collaborate their care with an outside provider. Patient/Guardian was advised if they have not already done so to contact the registration department to sign all necessary forms in order for [place] to release information regarding their care.   Consent: Patient/Guardian gives verbal consent for treatment and assignment of benefits for services provided during this visit. Patient/Guardian expressed understanding and agreed to proceed.  I provided 180 minutes of non-face-to-face time during this encounter.   Noralee Stain, LCSW, LCAS 01/28/23

## 2023-01-28 NOTE — Patient Instructions (Signed)
D:  Patient completed virtual MH-IOP today.  A:  Discharge today.  Follow up with Dr. Gilmore Laroche on 02-12-23 @ 11 a.m and Josh Sheets, LCSW on 01-31-23 @ 11 a.m..  Encouraged support groups through [facility] [phone].  R:  Patient receptive.
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
## 2023-01-29 ENCOUNTER — Other Ambulatory Visit (HOSPITAL_COMMUNITY): Payer: Medicare HMO

## 2023-01-30 ENCOUNTER — Other Ambulatory Visit (HOSPITAL_COMMUNITY): Payer: Medicare HMO

## 2023-01-31 ENCOUNTER — Ambulatory Visit (HOSPITAL_COMMUNITY): Payer: Medicare HMO | Admitting: Licensed Clinical Social Worker

## 2023-02-01 ENCOUNTER — Other Ambulatory Visit (HOSPITAL_COMMUNITY): Payer: Medicare HMO

## 2023-02-04 ENCOUNTER — Other Ambulatory Visit (HOSPITAL_COMMUNITY): Payer: Medicare HMO

## 2023-02-05 ENCOUNTER — Other Ambulatory Visit (HOSPITAL_COMMUNITY): Payer: Medicare HMO

## 2023-02-06 ENCOUNTER — Other Ambulatory Visit (HOSPITAL_COMMUNITY): Payer: Medicare HMO

## 2023-02-07 ENCOUNTER — Other Ambulatory Visit (HOSPITAL_COMMUNITY): Payer: Medicare HMO

## 2023-02-08 ENCOUNTER — Other Ambulatory Visit (HOSPITAL_COMMUNITY): Payer: Medicare HMO

## 2023-02-11 ENCOUNTER — Other Ambulatory Visit (HOSPITAL_COMMUNITY): Payer: Medicare HMO

## 2023-02-12 ENCOUNTER — Other Ambulatory Visit (HOSPITAL_COMMUNITY): Payer: Medicare HMO

## 2023-02-12 ENCOUNTER — Ambulatory Visit (HOSPITAL_COMMUNITY): Payer: Medicare HMO | Admitting: Psychiatry

## 2023-02-13 ENCOUNTER — Ambulatory Visit (HOSPITAL_COMMUNITY): Payer: Medicare HMO | Admitting: Licensed Clinical Social Worker

## 2023-02-14 ENCOUNTER — Other Ambulatory Visit (HOSPITAL_COMMUNITY): Payer: Medicare HMO

## 2023-02-15 ENCOUNTER — Other Ambulatory Visit (HOSPITAL_COMMUNITY): Payer: Medicare HMO

## 2023-02-18 ENCOUNTER — Other Ambulatory Visit (HOSPITAL_COMMUNITY): Payer: Medicare HMO

## 2023-02-19 ENCOUNTER — Other Ambulatory Visit (HOSPITAL_COMMUNITY): Payer: Medicare HMO

## 2023-02-20 ENCOUNTER — Other Ambulatory Visit (HOSPITAL_COMMUNITY): Payer: Medicare HMO

## 2023-02-21 ENCOUNTER — Other Ambulatory Visit (HOSPITAL_COMMUNITY): Payer: Medicare HMO

## 2023-02-22 ENCOUNTER — Other Ambulatory Visit (HOSPITAL_COMMUNITY): Payer: Medicare HMO

## 2023-02-25 ENCOUNTER — Ambulatory Visit (HOSPITAL_COMMUNITY): Payer: Medicare HMO | Admitting: Licensed Clinical Social Worker

## 2023-02-26 ENCOUNTER — Other Ambulatory Visit (HOSPITAL_COMMUNITY): Payer: Medicare HMO

## 2023-02-27 ENCOUNTER — Other Ambulatory Visit (HOSPITAL_COMMUNITY): Payer: Medicare HMO

## 2023-02-28 ENCOUNTER — Other Ambulatory Visit (HOSPITAL_COMMUNITY): Payer: Medicare HMO

## 2023-03-01 ENCOUNTER — Other Ambulatory Visit (HOSPITAL_COMMUNITY): Payer: Medicare HMO

## 2023-03-01 ENCOUNTER — Encounter (HOSPITAL_COMMUNITY): Payer: Self-pay

## 2023-03-08 ENCOUNTER — Ambulatory Visit (INDEPENDENT_AMBULATORY_CARE_PROVIDER_SITE_OTHER): Payer: Medicare HMO | Admitting: Family

## 2023-03-08 ENCOUNTER — Encounter: Payer: Self-pay | Admitting: Family

## 2023-03-08 VITALS — BP 114/80 | HR 83 | Temp 98.0°F | Ht 62.0 in | Wt 143.2 lb

## 2023-03-08 DIAGNOSIS — F411 Generalized anxiety disorder: Secondary | ICD-10-CM

## 2023-03-08 DIAGNOSIS — F332 Major depressive disorder, recurrent severe without psychotic features: Secondary | ICD-10-CM

## 2023-03-08 DIAGNOSIS — M797 Fibromyalgia: Secondary | ICD-10-CM

## 2023-03-08 DIAGNOSIS — M2550 Pain in unspecified joint: Secondary | ICD-10-CM

## 2023-03-08 DIAGNOSIS — H6121 Impacted cerumen, right ear: Secondary | ICD-10-CM

## 2023-03-08 DIAGNOSIS — Z23 Encounter for immunization: Secondary | ICD-10-CM

## 2023-03-08 MED ORDER — GABAPENTIN 400 MG PO CAPS
400.0000 mg | ORAL_CAPSULE | Freq: Three times a day (TID) | ORAL | 0 refills | Status: AC
Start: 2023-03-08 — End: 2023-06-06

## 2023-03-08 NOTE — Patient Instructions (Addendum)
  Start over the counter flonase to help with your drainage and allergies.   Use hydrogen peroxide and water, put in mixed in the cap for the hydrogen peroxide and use daily instead of Q tips.    ------------------------------------  I think this is a great option and appreciate you reaching out. From my experience in the past as far as referrals I recommend you call and reach out to the following two places to see if they have any Medicaid openings for therapy.   If they do please let me know and I will place the referral to the appropriate place.  Here are some medicaid recommendations, if these do not work out I suggest that you call your insurance and see available options and we can also place that referral appropriately with whatever you may find.  [facility] Phone # [phone] They have locations in [place], [place], [place], and [place]. They do take [employer] I know for sure and the [employer].   [facility] [phone]  [address] [place], [place], [place] [address]    [facility] [employer] [address] [place], [place] [address]   [phone]   [facility]- [phone]   [facility] [phone]   [facility]- [phone]   If at any time you feel your needs are more urgent or you are concerned for your well being please see the below options:  For walk in options for mental health:   [facility] [address] in [place], [place]. Call our 24-hour HelpLine at [phone] or [phone] for immediate assistance for mental health and substance abuse issues.  And or walk into [facility] hospital ER.   [employer] [facility]: 1-800-SUICIDE  [employer] Suicide Prevention Lifeline: 1-800-273-TALK  Regards,   Mort Sawyers FNP-C

## 2023-03-08 NOTE — Assessment & Plan Note (Signed)
Ceruminosis is noted.  Obtained verbal patient consent prior to procedure, possible risks of procedure discussed with pt prior, and then Wax was removed by syringing/irrigation and manual debridement was performed by me with curette. Instructions for home care to prevent wax buildup are given and handout provided to pt .pt tolerated procedure well.   

## 2023-03-08 NOTE — Assessment & Plan Note (Signed)
Referral placed for psychology and psychiatry (for medication management)  Did print out and discuss with patient available resources if in crisis. Pt verbalized understanding. Continue cymbalta and elavil as prescribed.

## 2023-03-08 NOTE — Assessment & Plan Note (Signed)
Refill sent for gabapentin 400 mg TID  Tolerating well, did advise to monitor for fatigue for CNS depression side effects.

## 2023-03-08 NOTE — Progress Notes (Signed)
Established Patient Office Visit  Subjective:   Patient ID: Sharon [place], female    DOB: 1976-06-03  Age: 47 y.o. MRN: [id number]  CC:  Chief Complaint  Patient presents with   Ear Pain    Right ear    HPI: Sharon Bowman is a 47 y.o. female presenting on 03/08/2023 for Ear Pain (Right ear)  Right ear pain, worse after taking a shower, but states has improved over the last few days. No other symptoms to include nasal congestion, cough, fever, and or sore throat. No discharge seen from the ear, does 'feel full'   Does have allergies, not currently taking anything over the counter.  Does have PND often.   Pt was recently inpatient for [facility] and states feeling much better, however she was seeing a therapist with IOP which has been completed but she is not set up with outpatient therapy and or psychiatry. She denies SI HI at current. She does state she stopped ging to prior therapist because the 60$ co pay was too expensive for her. She is on cymbalta 60 mg once daily, elavil 100 mg.     ROS: Negative unless specifically indicated above in HPI.   Relevant past medical history reviewed and updated as indicated.   Allergies and medications reviewed and updated.   Current Outpatient Medications:    amitriptyline (ELAVIL) 100 MG tablet, Take 100 mg by mouth at bedtime., Disp: , Rfl:    aspirin EC 81 MG tablet, Take 81 mg by mouth daily. Swallow whole., Disp: , Rfl:    aspirin-acetaminophen-caffeine (EXCEDRIN MIGRAINE) 250-250-65 MG per tablet, Take 1 tablet by mouth every 6 (six) hours as needed for headache or migraine. \, Disp: , Rfl:    Cyanocobalamin (B-12) 50 MCG TABS, Take 50 mcg by mouth daily. , Disp: , Rfl:    estradiol (VIVELLE-DOT) 0.1 MG/24HR patch, Place 1 patch onto the skin 2 (two) times a week. Change on Wednesday and Sundays, Disp: , Rfl:    ferrous sulfate 325 (65 FE) MG tablet, Take 325 mg by mouth daily with breakfast., Disp: , Rfl:    gabapentin (NEURONTIN) 400  MG capsule, Take 1 capsule (400 mg total) by mouth 3 (three) times daily., Disp: 270 capsule, Rfl: 0   Multiple Vitamin (MULTI-DAY VITAMINS PO), Take 1 tablet by mouth daily., Disp: , Rfl:    vitamin C (ASCORBIC ACID) 500 MG tablet, Take 500 mg by mouth daily., Disp: , Rfl:    DULoxetine (CYMBALTA) 60 MG capsule, Take 2 capsules (120 mg total) by mouth daily., Disp: 60 capsule, Rfl: 0  Allergies  Allergen Reactions   Latex Itching and Rash    Objective:   BP 114/80 (BP Location: Left Arm, Patient Position: Sitting, Cuff Size: Normal)   Pulse 83   Temp 98 F (36.7 C) (Temporal)   Ht 5\' 2"  (1.575 m)   Wt 143 lb 3.2 oz (65 kg)   LMP 11/01/2014 (Approximate)   SpO2 99%   BMI 26.19 kg/m    Physical Exam Constitutional:      General: She is not in acute distress.    Appearance: Normal appearance. She is normal weight. She is not ill-appearing, toxic-appearing or diaphoretic.  HENT:     Head: Normocephalic.     Right Ear: There is impacted cerumen.     Left Ear: Tympanic membrane normal.     Nose: Nose normal.     Mouth/Throat:     Mouth: Mucous membranes are dry.  Pharynx: No oropharyngeal exudate or posterior oropharyngeal erythema.  Eyes:     Extraocular Movements: Extraocular movements intact.     Pupils: Pupils are equal, round, and reactive to light.  Cardiovascular:     Rate and Rhythm: Normal rate and regular rhythm.     Pulses: Normal pulses.     Heart sounds: Normal heart sounds.  Pulmonary:     Effort: Pulmonary effort is normal.     Breath sounds: Normal breath sounds.  Musculoskeletal:     Cervical back: Normal range of motion.  Neurological:     General: No focal deficit present.     Mental Status: She is alert and oriented to person, place, and time. Mental status is at baseline.  Psychiatric:        Mood and Affect: Mood normal.        Behavior: Behavior normal.        Thought Content: Thought content normal.        Judgment: Judgment normal.      Assessment & Plan:  Anxiety state -     Ambulatory referral to Psychology -     Ambulatory referral to Psychiatry  MDD (major depressive disorder), recurrent severe, without psychosis (HCC) Assessment & Plan: Referral placed for psychology and psychiatry (for medication management)  Did print out and discuss with patient available resources if in crisis. Pt verbalized understanding. Continue cymbalta and elavil as prescribed.    Orders: -     Ambulatory referral to Psychology -     Ambulatory referral to Psychiatry  Fibromyalgia Assessment & Plan: Refill sent for gabapentin 400 mg TID  Tolerating well, did advise to monitor for fatigue for CNS depression side effects.   Orders: -     Gabapentin; Take 1 capsule (400 mg total) by mouth 3 (three) times daily.  Dispense: 270 capsule; Refill: 0  Polyarthralgia -     Gabapentin; Take 1 capsule (400 mg total) by mouth 3 (three) times daily.  Dispense: 270 capsule; Refill: 0  Impacted cerumen, right ear Assessment & Plan: Ceruminosis is noted.  Obtained verbal patient consent prior to procedure, possible risks of procedure discussed with pt prior, and then Wax was removed by syringing/irrigation and manual debridement was performed by me with curette. Instructions for home care to prevent wax buildup are given and handout provided to pt .pt tolerated procedure well.     Encounter for immunization -     Flu vaccine trivalent PF, 6mos and older(Flulaval,Afluria,Fluarix,Fluzone)     Follow up plan: Return in about 3 months (around 06/07/2023), or if symptoms worsen or fail to improve, for f/u anxiety.  Mort Sawyers, FNP

## 2023-03-11 ENCOUNTER — Encounter: Payer: Self-pay | Admitting: *Deleted

## 2023-03-11 ENCOUNTER — Ambulatory Visit (HOSPITAL_COMMUNITY): Payer: Medicare HMO | Admitting: Licensed Clinical Social Worker

## 2023-03-22 ENCOUNTER — Ambulatory Visit: Payer: Medicare HMO | Admitting: Family

## 2023-03-22 ENCOUNTER — Encounter: Payer: Self-pay | Admitting: Family

## 2023-03-22 VITALS — BP 110/66 | HR 84 | Temp 98.1°F | Ht 62.0 in | Wt 142.0 lb

## 2023-03-22 DIAGNOSIS — J019 Acute sinusitis, unspecified: Secondary | ICD-10-CM

## 2023-03-22 DIAGNOSIS — B9689 Other specified bacterial agents as the cause of diseases classified elsewhere: Secondary | ICD-10-CM | POA: Diagnosis not present

## 2023-03-22 DIAGNOSIS — R509 Fever, unspecified: Secondary | ICD-10-CM

## 2023-03-22 LAB — POCT COVID BINAXNOW CARD: SARS Coronavirus 2 Ag: NEGATIVE

## 2023-03-22 LAB — POCT INFLUENZA A/B
Influenza A, POC: NEGATIVE
Influenza B, POC: NEGATIVE

## 2023-03-22 MED ORDER — AMOXICILLIN-POT CLAVULANATE 875-125 MG PO TABS: 1.0000 | ORAL_TABLET | Freq: Two times a day (BID) | ORAL | 0 refills | Status: DC

## 2023-03-22 MED ORDER — PROMETHAZINE-DM 6.25-15 MG/5ML PO SYRP: 5.0000 mL | ORAL_SOLUTION | Freq: Four times a day (QID) | ORAL | 0 refills | Status: DC | PRN
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
## 2023-04-03 ENCOUNTER — Other Ambulatory Visit: Payer: Self-pay | Admitting: Family

## 2023-04-03 DIAGNOSIS — M2550 Pain in unspecified joint: Secondary | ICD-10-CM

## 2023-04-03 DIAGNOSIS — M797 Fibromyalgia: Secondary | ICD-10-CM

## 2023-06-14 DIAGNOSIS — Z0142 Encounter for cervical smear to confirm findings of recent normal smear following initial abnormal smear: Secondary | ICD-10-CM | POA: Diagnosis not present

## 2023-06-14 DIAGNOSIS — Z01419 Encounter for gynecological examination (general) (routine) without abnormal findings: Secondary | ICD-10-CM | POA: Diagnosis not present

## 2023-06-14 DIAGNOSIS — Z124 Encounter for screening for malignant neoplasm of cervix: Secondary | ICD-10-CM | POA: Diagnosis not present

## 2023-06-14 DIAGNOSIS — Z1331 Encounter for screening for depression: Secondary | ICD-10-CM | POA: Diagnosis not present

## 2023-06-14 DIAGNOSIS — F32A Depression, unspecified: Secondary | ICD-10-CM | POA: Diagnosis not present

## 2023-06-14 DIAGNOSIS — Z1159 Encounter for screening for other viral diseases: Secondary | ICD-10-CM | POA: Diagnosis not present

## 2023-06-14 DIAGNOSIS — Z1231 Encounter for screening mammogram for malignant neoplasm of breast: Secondary | ICD-10-CM | POA: Diagnosis not present

## 2023-06-14 DIAGNOSIS — Z01411 Encounter for gynecological examination (general) (routine) with abnormal findings: Secondary | ICD-10-CM | POA: Diagnosis not present

## 2023-06-14 DIAGNOSIS — Z90711 Acquired absence of uterus with remaining cervical stump: Secondary | ICD-10-CM | POA: Diagnosis not present

## 2023-06-14 DIAGNOSIS — Z114 Encounter for screening for human immunodeficiency virus [HIV]: Secondary | ICD-10-CM | POA: Diagnosis not present

## 2023-06-14 DIAGNOSIS — Z113 Encounter for screening for infections with a predominantly sexual mode of transmission: Secondary | ICD-10-CM | POA: Diagnosis not present

## 2023-06-14 LAB — HM MAMMOGRAPHY

## 2023-07-09 ENCOUNTER — Ambulatory Visit: Payer: Medicare HMO | Admitting: Family

## 2023-07-10 ENCOUNTER — Telehealth: Payer: Self-pay | Admitting: Family

## 2023-07-10 ENCOUNTER — Encounter: Payer: Self-pay | Admitting: Family

## 2023-07-10 NOTE — Telephone Encounter (Signed)
 Copied from CRM [id number]. Topic: General - Billing Inquiry >> Jul 10, 2023  2:40 PM Joanell B wrote: Reason for CRM: Pt is requesting a callback regarding receiving a bill after being told the office doesn't open till 10 and was charged for a no show visit.  LVM for patient to call back. If pt returns call, please advise her that we no longer charge for no shows however we do send a letter to notify of the no show. Office opened at 8 am yesterday, not 10 am. Office opened at 10:00 am Monday 1/6 due to inclement weather- this is the only thing I can think of.

## 2023-07-11 DIAGNOSIS — M1712 Unilateral primary osteoarthritis, left knee: Secondary | ICD-10-CM | POA: Diagnosis not present

## 2023-07-16 ENCOUNTER — Encounter: Payer: Self-pay | Admitting: Family

## 2023-07-16 ENCOUNTER — Ambulatory Visit: Payer: Medicare HMO | Admitting: Family

## 2023-07-16 VITALS — BP 110/76 | HR 78 | Temp 98.4°F | Ht 62.0 in | Wt 136.0 lb

## 2023-07-16 DIAGNOSIS — R52 Pain, unspecified: Secondary | ICD-10-CM

## 2023-07-16 DIAGNOSIS — F332 Major depressive disorder, recurrent severe without psychotic features: Secondary | ICD-10-CM | POA: Diagnosis not present

## 2023-07-16 DIAGNOSIS — Q613 Polycystic kidney, unspecified: Secondary | ICD-10-CM

## 2023-07-16 DIAGNOSIS — E782 Mixed hyperlipidemia: Secondary | ICD-10-CM

## 2023-07-16 DIAGNOSIS — Z862 Personal history of diseases of the blood and blood-forming organs and certain disorders involving the immune mechanism: Secondary | ICD-10-CM

## 2023-07-16 DIAGNOSIS — Z20822 Contact with and (suspected) exposure to covid-19: Secondary | ICD-10-CM

## 2023-07-16 DIAGNOSIS — J069 Acute upper respiratory infection, unspecified: Secondary | ICD-10-CM

## 2023-07-16 DIAGNOSIS — F5101 Primary insomnia: Secondary | ICD-10-CM | POA: Diagnosis not present

## 2023-07-16 DIAGNOSIS — E559 Vitamin D deficiency, unspecified: Secondary | ICD-10-CM

## 2023-07-16 DIAGNOSIS — F411 Generalized anxiety disorder: Secondary | ICD-10-CM

## 2023-07-16 DIAGNOSIS — R7303 Prediabetes: Secondary | ICD-10-CM

## 2023-07-16 DIAGNOSIS — F32A Depression, unspecified: Secondary | ICD-10-CM

## 2023-07-16 DIAGNOSIS — D500 Iron deficiency anemia secondary to blood loss (chronic): Secondary | ICD-10-CM

## 2023-07-16 DIAGNOSIS — D72829 Elevated white blood cell count, unspecified: Secondary | ICD-10-CM

## 2023-07-16 LAB — COMPREHENSIVE METABOLIC PANEL
ALT: 43 U/L — ABNORMAL HIGH (ref 0–35)
AST: 25 U/L (ref 0–37)
Albumin: 4.3 g/dL (ref 3.5–5.2)
Alkaline Phosphatase: 59 U/L (ref 39–117)
BUN: 14 mg/dL (ref 6–23)
CO2: 27 meq/L (ref 19–32)
Calcium: 9.5 mg/dL (ref 8.4–10.5)
Chloride: 100 meq/L (ref 96–112)
Creatinine, Ser: 0.85 mg/dL (ref 0.40–1.20)
GFR: 81.56 mL/min (ref >60.00)
Glucose, Bld: 99 mg/dL (ref 70–99)
Potassium: 5 meq/L (ref 3.5–5.1)
Sodium: 137 meq/L (ref 135–145)
Total Bilirubin: 0.4 mg/dL (ref 0.2–1.2)
Total Protein: 6.6 g/dL (ref 6.0–8.3)

## 2023-07-16 LAB — VITAMIN D 25 HYDROXY (VIT D DEFICIENCY, FRACTURES): VITD: 28.81 ng/mL — ABNORMAL LOW (ref 30.00–100.00)

## 2023-07-16 LAB — CBC
HCT: 44.2 % (ref 36.0–46.0)
Hemoglobin: 14.2 g/dL (ref 12.0–15.0)
MCHC: 32.2 g/dL (ref 30.0–36.0)
MCV: 92.5 fL (ref 78.0–100.0)
Platelets: 386 10*3/uL (ref 150.0–400.0)
RBC: 4.78 Mil/uL (ref 3.87–5.11)
RDW: 14.6 % (ref 11.5–15.5)
WBC: 13.5 10*3/uL — ABNORMAL HIGH (ref 4.0–10.5)

## 2023-07-16 LAB — HEMOGLOBIN A1C: Hgb A1c MFr Bld: 6.3 % (ref 4.6–6.5)

## 2023-07-16 LAB — LIPID PANEL
Cholesterol: 231 mg/dL — ABNORMAL HIGH (ref 0–200)
HDL: 61.2 mg/dL (ref >39.00)
LDL Cholesterol: 122 mg/dL — ABNORMAL HIGH (ref 0–99)
NonHDL: 169.38
Total CHOL/HDL Ratio: 4
Triglycerides: 237 mg/dL — ABNORMAL HIGH (ref 0.0–149.0)
VLDL: 47.4 mg/dL — ABNORMAL HIGH (ref 0.0–40.0)

## 2023-07-16 LAB — POCT INFLUENZA A/B
Influenza A, POC: NEGATIVE
Influenza B, POC: NEGATIVE

## 2023-07-16 LAB — POC COVID19 BINAXNOW: SARS Coronavirus 2 Ag: NEGATIVE

## 2023-07-16 LAB — TSH: TSH: 0.76 u[IU]/mL (ref 0.35–5.50)

## 2023-07-16 MED ORDER — TRAZODONE HCL 50 MG PO TABS
50.0000 mg | ORAL_TABLET | Freq: Every day | ORAL | 3 refills | Status: DC
Start: 2023-07-16 — End: 2023-07-16

## 2023-07-16 MED ORDER — QUETIAPINE FUMARATE 25 MG PO TABS
25.0000 mg | ORAL_TABLET | Freq: Every day | ORAL | 0 refills | Status: DC
Start: 2023-07-16 — End: 2023-08-14

## 2023-07-16 NOTE — Assessment & Plan Note (Signed)
 Covid and flu negative in office.  Advised patient on supportive measures:  Be sure to rest, drink plenty of fluids, and use tylenol  or ibuprofen  as needed for pain. Follow up if fever >101, if symptoms worsen or if symptoms are not improved in 3 days. Patient verbalizes understanding.

## 2023-07-16 NOTE — Patient Instructions (Addendum)
  A referral was placed today for both psychiatry and psychology.  Please let us  know if you have not heard back within 2 weeks about the referral.  Trial seroquel  at night time to see if this helps with racing thoughts and sleep.   If at any time you feel your needs are more urgent or you are concerned for your well being please see the below options:  For walk in options for mental health:   [facility] [address] in [place], [place]. Call our 24-hour HelpLine at [phone] or [phone] for immediate assistance for mental health and substance abuse issues.  And or walk into [facility] hospital ER.   [employer] [facility]: 1-800-SUICIDE  [employer] Suicide Prevention Lifeline: 1-800-273-TALK  Regards,   Ginger Patrick FNP-C
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
## 2023-07-16 NOTE — Assessment & Plan Note (Signed)
 Hesitant to change around medications as currently on high dose cymbalta  at 120 mg once daily, as well as elavil  100 mg once daily for headaches. Will trial start seroquel  for night time, low dose at 25 mg nihgtly. I think this will help her with sleep as well as racing thoughts. Did d/w her in detail I would like for her to see a psychiatrist for medication management and also a therapist. If too expensive we could consider trial of change cymbalta  but prefer psychiatry. Could consider viibryd  Discussed with  her [facility] walk ins available if more urgent care needed.

## 2023-07-17 ENCOUNTER — Other Ambulatory Visit: Payer: Self-pay | Admitting: Family

## 2023-07-17 ENCOUNTER — Ambulatory Visit: Payer: Medicare HMO

## 2023-07-17 DIAGNOSIS — D72829 Elevated white blood cell count, unspecified: Secondary | ICD-10-CM | POA: Diagnosis not present

## 2023-07-17 DIAGNOSIS — E559 Vitamin D deficiency, unspecified: Secondary | ICD-10-CM

## 2023-07-17 LAB — WHITE CELL DIFFERENTIAL
Band Neutrophils: 0 %
Basophils Relative: 0.8 % (ref 0.0–3.0)
Eosinophils Relative: 1.5 % (ref 0.0–5.0)
Lymphocytes Relative: 23 % (ref 12.0–46.0)
Monocytes Relative: 5.2 % (ref 3.0–12.0)
Neutrophils Relative %: 69.5 % (ref 43.0–77.0)

## 2023-07-17 MED ORDER — CHOLECALCIFEROL 1.25 MG (50000 UT) PO TABS: 1.0000 | ORAL_TABLET | ORAL | 0 refills | Status: AC

## 2023-07-17 NOTE — Progress Notes (Signed)
 Can we add diff to the CBC?

## 2023-07-17 NOTE — Addendum Note (Signed)
 Addended by: Gerry Krone on: 07/17/2023 07:50 AM   Modules accepted: Orders

## 2023-08-12 ENCOUNTER — Other Ambulatory Visit: Payer: Self-pay | Admitting: Family

## 2023-08-12 DIAGNOSIS — F5101 Primary insomnia: Secondary | ICD-10-CM

## 2023-08-12 DIAGNOSIS — F411 Generalized anxiety disorder: Secondary | ICD-10-CM

## 2023-08-12 DIAGNOSIS — F332 Major depressive disorder, recurrent severe without psychotic features: Secondary | ICD-10-CM

## 2023-08-14 ENCOUNTER — Other Ambulatory Visit: Payer: Self-pay | Admitting: Family

## 2023-08-14 NOTE — Telephone Encounter (Signed)
lvm for pt to call office to schedule appt for f/u

## 2023-08-14 NOTE — Telephone Encounter (Signed)
Copied from CRM [id number]. Topic: Clinical - Medical Advice >> Aug 14, 2023  1:09 PM Alcus Dad wrote: Reason for CRM: Patient stated that when she came in last time she put her on medication. Patient stated that [facility] twice but haven't gotten a response

## 2023-08-14 NOTE — Telephone Encounter (Signed)
Pt was started on a new medication last visit. Needs to f/u within the next two weeks, overdue.

## 2023-08-14 NOTE — Telephone Encounter (Signed)
Copied from CRM [id number]. Topic: Clinical - Medication Refill >> Aug 14, 2023  1:11 PM Alcus Dad wrote: Most Recent Primary Care Visit:  Provider: Mort Sawyers  Department: [facility]  Visit Type: OFFICE VISIT  Date: 07/16/2023  Medication: QUEtiapine (SEROQUEL) 25 MG tablet  Has the patient contacted their pharmacy? Yes (Agent: If no, request that the patient contact the pharmacy for the refill. If patient does not wish to contact the pharmacy document the reason why and proceed with request.) (Agent: If yes, when and what did the pharmacy advise?)  Is this the correct pharmacy for this prescription? Yes If no, delete pharmacy and type the correct one.  This is the patient's preferred pharmacy:  [id number] [place], [place] - [address] [place] [address] Phone: [phone] Fax: [phone]  [id number] - [address] ([place]), [place] - [address] [place] [address] Phone: [phone] Fax: [phone]  [id number] - [place], [place] - [address] [place] [address] Phone: [phone] Fax: [phone]   Has the prescription been filled recently? No  Is the patient out of the medication? No (have two left)  Has the patient been seen for an appointment in the last year OR does the patient have an upcoming appointment? Yes  Can we respond through MyChart? Yes  Agent: Please be advised that Rx refills may take up to 3 business days. We ask that you follow-up with your pharmacy.

## 2023-08-14 NOTE — Telephone Encounter (Signed)
Duplicate message. See encounter from 08/12/23.

## 2023-08-16 DIAGNOSIS — E611 Iron deficiency: Secondary | ICD-10-CM | POA: Diagnosis not present

## 2023-08-16 DIAGNOSIS — R7303 Prediabetes: Secondary | ICD-10-CM | POA: Diagnosis not present

## 2023-08-16 DIAGNOSIS — E7800 Pure hypercholesterolemia, unspecified: Secondary | ICD-10-CM | POA: Diagnosis not present

## 2023-08-16 DIAGNOSIS — Q613 Polycystic kidney, unspecified: Secondary | ICD-10-CM | POA: Diagnosis not present

## 2023-08-16 DIAGNOSIS — N3010 Interstitial cystitis (chronic) without hematuria: Secondary | ICD-10-CM | POA: Diagnosis not present

## 2023-08-16 DIAGNOSIS — N181 Chronic kidney disease, stage 1: Secondary | ICD-10-CM | POA: Diagnosis not present

## 2023-08-16 LAB — LAB REPORT - SCANNED: EGFR: 82

## 2023-08-23 DIAGNOSIS — F332 Major depressive disorder, recurrent severe without psychotic features: Secondary | ICD-10-CM | POA: Diagnosis not present

## 2023-08-23 DIAGNOSIS — G4700 Insomnia, unspecified: Secondary | ICD-10-CM | POA: Diagnosis not present

## 2023-09-03 ENCOUNTER — Other Ambulatory Visit: Payer: Self-pay | Admitting: Nephrology

## 2023-09-03 DIAGNOSIS — Q613 Polycystic kidney, unspecified: Secondary | ICD-10-CM

## 2023-09-03 DIAGNOSIS — N181 Chronic kidney disease, stage 1: Secondary | ICD-10-CM

## 2023-09-03 DIAGNOSIS — N3010 Interstitial cystitis (chronic) without hematuria: Secondary | ICD-10-CM

## 2023-09-03 DIAGNOSIS — E7800 Pure hypercholesterolemia, unspecified: Secondary | ICD-10-CM

## 2023-09-03 DIAGNOSIS — R7303 Prediabetes: Secondary | ICD-10-CM

## 2023-09-06 DIAGNOSIS — M79671 Pain in right foot: Secondary | ICD-10-CM | POA: Diagnosis not present

## 2023-09-09 ENCOUNTER — Encounter: Payer: Self-pay | Admitting: Nephrology

## 2023-09-10 ENCOUNTER — Ambulatory Visit
Admission: RE | Admit: 2023-09-10 | Discharge: 2023-09-10 | Disposition: A | Discharge status: Home / Self Care | Source: Ambulatory Visit | Attending: Nephrology | Admitting: Nephrology

## 2023-09-10 DIAGNOSIS — Q613 Polycystic kidney, unspecified: Secondary | ICD-10-CM

## 2023-09-10 DIAGNOSIS — R7303 Prediabetes: Secondary | ICD-10-CM

## 2023-09-10 DIAGNOSIS — E7800 Pure hypercholesterolemia, unspecified: Secondary | ICD-10-CM

## 2023-09-10 DIAGNOSIS — N181 Chronic kidney disease, stage 1: Secondary | ICD-10-CM

## 2023-09-10 DIAGNOSIS — N3010 Interstitial cystitis (chronic) without hematuria: Secondary | ICD-10-CM

## 2023-09-10 DIAGNOSIS — K8020 Calculus of gallbladder without cholecystitis without obstruction: Secondary | ICD-10-CM | POA: Diagnosis not present

## 2023-09-12 ENCOUNTER — Other Ambulatory Visit: Payer: Self-pay | Admitting: Family

## 2023-09-12 DIAGNOSIS — F332 Major depressive disorder, recurrent severe without psychotic features: Secondary | ICD-10-CM

## 2023-09-12 DIAGNOSIS — F5101 Primary insomnia: Secondary | ICD-10-CM

## 2023-09-12 DIAGNOSIS — F411 Generalized anxiety disorder: Secondary | ICD-10-CM

## 2023-09-13 DIAGNOSIS — F332 Major depressive disorder, recurrent severe without psychotic features: Secondary | ICD-10-CM | POA: Diagnosis not present

## 2023-09-13 DIAGNOSIS — G4700 Insomnia, unspecified: Secondary | ICD-10-CM | POA: Diagnosis not present

## 2023-09-16 ENCOUNTER — Other Ambulatory Visit: Payer: Self-pay | Admitting: Family

## 2023-09-16 DIAGNOSIS — M2550 Pain in unspecified joint: Secondary | ICD-10-CM

## 2023-09-16 DIAGNOSIS — M797 Fibromyalgia: Secondary | ICD-10-CM

## 2023-10-04 DIAGNOSIS — G4700 Insomnia, unspecified: Secondary | ICD-10-CM | POA: Diagnosis not present

## 2023-10-04 DIAGNOSIS — F332 Major depressive disorder, recurrent severe without psychotic features: Secondary | ICD-10-CM | POA: Diagnosis not present

## 2023-10-09 ENCOUNTER — Other Ambulatory Visit: Payer: Self-pay | Admitting: Family

## 2023-10-09 DIAGNOSIS — M2550 Pain in unspecified joint: Secondary | ICD-10-CM

## 2023-10-09 DIAGNOSIS — M797 Fibromyalgia: Secondary | ICD-10-CM

## 2023-10-10 ENCOUNTER — Other Ambulatory Visit: Payer: Self-pay | Admitting: Family

## 2023-10-10 DIAGNOSIS — F332 Major depressive disorder, recurrent severe without psychotic features: Secondary | ICD-10-CM

## 2023-10-10 DIAGNOSIS — F411 Generalized anxiety disorder: Secondary | ICD-10-CM

## 2023-10-10 DIAGNOSIS — F5101 Primary insomnia: Secondary | ICD-10-CM

## 2023-10-15 ENCOUNTER — Ambulatory Visit: Payer: Self-pay

## 2023-10-15 NOTE — Telephone Encounter (Signed)
 Chief Complaint: Left Shoulder/Collar Bone Pain Symptoms: pain, reduced function Frequency: Began in January Pertinent Negatives: Patient denies CP, SOB, fever, swelling Disposition: [] ED /[] Urgent Care (no appt availability in office) / [x] Appointment(In office/virtual)/ []  [facility]/ [] Home Care/ [] Refused Recommended Disposition /[] [facility]/ []  Follow-up with PCP Additional Notes: Pt reports she slept on her couch one night in January, she awoke with significant pain in her left shoulder/collar bone. Pt notes it sometimes feels like pinching and reports some weakness to the left arm/hand due to the pain. Pt denies CP, SOB, numbness, tingling, swelling, fever. Pt offered OV, declines as she has appt scheduled for med check Monday, requests to discuss this at that appt if provider feels appropriate. This RN educated pt on home care, new-worsening symptoms, when to call back/seek emergent care. Pt verbalized understanding and agrees to plan.     Copied from CRM [id number]. Topic: Clinical - Red Word Triage >> Oct 15, 2023  3:52 PM Kita Perish H wrote: [facility] that prompted transfer to Nurse Triage: Left shoulder collar bone area pain feels like pinching. Reason for Disposition  Weakness (i.e., loss of strength) in hand or fingers  (Exception: Not truly weak; hand feels weak because of pain.)  Answer Assessment - Initial Assessment Questions 1. ONSET: "When did the pain start?"     Began in January 2. LOCATION: "Where is the pain located?"     Left shoulder, collar bone, neck 3. PAIN: "How bad is the pain?" (Scale 1-10; or mild, moderate, severe)   - MILD (1-3): doesn't interfere with normal activities   - MODERATE (4-7): interferes with normal activities (e.g., work or school) or awakens from sleep   - SEVERE (8-10): excruciating pain, unable to do any normal activities, unable to move arm at all due to pain     8/10, intermittent, worse with movement 5. CAUSE: "What  do you think is causing the shoulder pain?"     Pt notes she slept on the couch and woke up with this pain, no improvement 6. OTHER SYMPTOMS: "Do you have any other symptoms?" (e.g., neck pain, swelling, rash, fever, numbness, weakness)     Weakness since pain began  Protocols used: Shoulder Pain-A-AH

## 2023-10-15 NOTE — Telephone Encounter (Signed)
Agree with precautions given to pt  Agree with nurse assessment in plan.  Thank you for speaking with them.

## 2023-10-16 ENCOUNTER — Ambulatory Visit

## 2023-10-16 ENCOUNTER — Ambulatory Visit (INDEPENDENT_AMBULATORY_CARE_PROVIDER_SITE_OTHER): Admitting: Nurse Practitioner

## 2023-10-16 ENCOUNTER — Ambulatory Visit: Payer: Self-pay

## 2023-10-16 VITALS — BP 130/84 | HR 76 | Temp 97.7°F | Ht 62.0 in | Wt 135.2 lb

## 2023-10-16 DIAGNOSIS — R079 Chest pain, unspecified: Secondary | ICD-10-CM | POA: Diagnosis not present

## 2023-10-16 DIAGNOSIS — M25512 Pain in left shoulder: Secondary | ICD-10-CM | POA: Insufficient documentation

## 2023-10-16 DIAGNOSIS — R0789 Other chest pain: Secondary | ICD-10-CM | POA: Diagnosis not present

## 2023-10-16 MED ORDER — MELOXICAM 7.5 MG PO TABS: 7.5000 mg | ORAL_TABLET | Freq: Every day | ORAL | 0 refills | Status: DC
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
## 2023-10-16 NOTE — Telephone Encounter (Signed)
 NOTED

## 2023-10-16 NOTE — Assessment & Plan Note (Addendum)
 Chronic with acute exacerbation Etiology unclear Collect x-ray of chest wall and left shoulder today EKG: NSR, no ST elevation. Appears consistent and unchanged compared to previous EKG from 10/2022. Patient is already established with orthopedics.  She was encouraged to call office and schedule an appointment in the next 2 to 3 weeks.  She will treat at home with meloxicam 7.5 mg tablet by mouth daily x 2 weeks, then use as needed.  She was educated to avoid any other NSAID use and to take medication with food to prevent upset stomach and GI bleed.  She reports her understanding.

## 2023-10-16 NOTE — Telephone Encounter (Signed)
 Chief Complaint: left shoulder pain Symptoms: left shoulder pain and swelling Frequency: since Jan Pertinent Negatives: Patient denies injury Disposition: [] ED /[] Urgent Care (no appt availability in office) / [x] Appointment(In office/virtual)/ []  [facility]/ [] Home Care/ [] Refused Recommended Disposition /[] [facility]/ []  Follow-up with PCP Additional Notes: pt states that the pain in her left shoulder is worse, states this morning it is a 10/10 and the collar bone is more pronounced. Pt states she has not taken anything for the pain. Instructed to take some medication and see if that helps with pain and appt scheduled for today at [facility]. Instructed to call back or ED if medication doesn't help with pain.   Copied from CRM [id number]. Topic: Clinical - Red Word Triage >> Oct 16, 2023 10:15 AM Baldomero Bone wrote: Red Word that prompted transfer to Nurse Triage: Collarbone and shoulder pain, swollen and poking out on the left side, pain level is 10 right now. callback number [phone] Reason for Disposition  [1] Unable to use arm at all AND [2] because of shoulder pain or stiffness  Answer Assessment - Initial Assessment Questions 1. ONSET: "When did the pain start?"     Months but got worse this morning 2. LOCATION: "Where is the pain located?"     Left shoulder 3. PAIN: "How bad is the pain?" (Scale 1-10; or mild, moderate, severe)   - MILD (1-3): doesn't interfere with normal activities   - MODERATE (4-7): interferes with normal activities (e.g., work or school) or awakens from sleep   - SEVERE (8-10): excruciating pain, unable to do any normal activities, unable to move arm at all due to pain     10/10 4. WORK OR EXERCISE: "Has there been any recent work or exercise that involved this part of the body?"     no 5. CAUSE: "What do you think is causing the shoulder pain?"     no 6. OTHER SYMPTOMS: "Do you have any other symptoms?" (e.g., neck pain, swelling, rash,  fever, numbness, weakness)     no  Protocols used: Shoulder Pain-A-AH

## 2023-10-17 ENCOUNTER — Encounter: Payer: Self-pay | Admitting: Nurse Practitioner

## 2023-10-21 ENCOUNTER — Encounter: Payer: Self-pay | Admitting: Family

## 2023-10-21 ENCOUNTER — Ambulatory Visit (INDEPENDENT_AMBULATORY_CARE_PROVIDER_SITE_OTHER): Admitting: Family

## 2023-10-21 VITALS — BP 118/82 | HR 73 | Temp 98.0°F | Ht 62.0 in | Wt 138.4 lb

## 2023-10-21 DIAGNOSIS — F419 Anxiety disorder, unspecified: Secondary | ICD-10-CM | POA: Diagnosis not present

## 2023-10-21 DIAGNOSIS — N3010 Interstitial cystitis (chronic) without hematuria: Secondary | ICD-10-CM

## 2023-10-21 DIAGNOSIS — M797 Fibromyalgia: Secondary | ICD-10-CM

## 2023-10-21 DIAGNOSIS — F32A Depression, unspecified: Secondary | ICD-10-CM | POA: Diagnosis not present

## 2023-10-21 MED ORDER — DULOXETINE HCL 60 MG PO CPEP
120.0000 mg | ORAL_CAPSULE | Freq: Every day | ORAL | 0 refills | Status: DC
Start: 2023-10-21 — End: 2024-01-06

## 2023-10-21 MED ORDER — AMITRIPTYLINE HCL 100 MG PO TABS: 100.0000 mg | ORAL_TABLET | Freq: Every day | ORAL | 0 refills | Status: DC

## 2023-10-21 NOTE — Patient Instructions (Addendum)
  Call urology I think you are over due for an appointment.   Ask your psychiatrist the following:  Can she take over fills for your cymbalta  and seroquel ? This way we can avoid polypharmacy.  Advise her you are also taking amitriptyline  for Interstitial cystitis.    ------------------------------------ Please call the # below for your therapy to get set up:   Hello,   We are reaching out from [facility] and we have left you a voicemail because we got a referral for you from your provider and would like to get new patient paperwork sent out to you.    If you are still interested in scheduling an appointment, please call our office at [phone] or respond to this with the preferred email to send new patient paperwork via docusign.    -Thanks!-   ------------------------------------

## 2023-10-21 NOTE — Progress Notes (Signed)
 Established Patient Office Visit  Subjective:      CC:  Chief Complaint  Patient presents with   Medical Management of Chronic Issues    HPI: Sharon Bowman is a 48 y.o. female presenting on 10/21/2023 for Medical Management of Chronic Issues  IC: taking amitriptyline  100 mg, she had her recent refill denied.  Followed by urology but is overdue for an appt. Last seen 2023.   Followed by psychiatry virtual, she was recently placed on abilify . She takes seroquel  and cymbalta  with our office currently as well. On elavil  for IC. She is not currently with a therapist.   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       Social history:  Relevant past medical, surgical, family and social history reviewed and updated as indicated. Interim medical history since our last visit reviewed.  Allergies and medications reviewed and updated.  DATA REVIEWED: CHART IN EPIC     ROS: Negative unless specifically indicated above in HPI.    Current Outpatient Medications:    aspirin  EC 81 MG tablet, Take 81 mg by mouth daily. Swallow whole., Disp: , Rfl:    aspirin -acetaminophen -caffeine  (EXCEDRIN  MIGRAINE) 250-250-65 MG per tablet, Take 1 tablet by mouth every 6 (six) hours as needed for headache or migraine. \, Disp: , Rfl:    Cholecalciferol  1.25 MG (50000 UT) TABS, Take 1 tablet by mouth once a week., Disp: 8 tablet, Rfl: 0   Cyanocobalamin  (B-12) 50 MCG TABS, Take 50 mcg by mouth daily. , Disp: , Rfl:    estradiol  (VIVELLE -DOT) 0.1 MG/24HR patch, Place 1 patch onto the skin 2 (two) times a week. Change on Wednesday and Sundays, Disp: , Rfl:    ferrous sulfate  325 (65 FE) MG tablet, Take 325 mg by mouth daily with breakfast., Disp: , Rfl:    gabapentin  (NEURONTIN ) 400 MG capsule, TAKE 1 CAPSULE BY MOUTH 3 TIMES DAILY., Disp: 270 capsule, Rfl: 0   meloxicam  (MOBIC ) 7.5 MG tablet, Take 1 tablet (7.5 mg total) by mouth  daily., Disp: 30 tablet, Rfl: 0   Multiple Vitamin (MULTI-DAY VITAMINS PO), Take 1 tablet by mouth daily., Disp: , Rfl:    QUEtiapine  (SEROQUEL ) 25 MG tablet, TAKE 1 TABLET BY MOUTH EVERYDAY AT BEDTIME, Disp: 30 tablet, Rfl: 0   vitamin C  (ASCORBIC ACID ) 500 MG tablet, Take 500 mg by mouth daily., Disp: , Rfl:    amitriptyline  (ELAVIL ) 100 MG tablet, Take 1 tablet (100 mg total) by mouth at bedtime., Disp: 90 tablet, Rfl: 0   DULoxetine  (CYMBALTA ) 60 MG capsule, Take 2 capsules (120 mg total) by mouth daily., Disp: 180 capsule, Rfl: 0      Objective:    BP 118/82 (BP Location: Left Arm, Patient Position: Sitting)   Pulse 73   Temp 98 F (36.7 C) (Temporal)   Ht 5\' 2"  (1.575 m)   Wt 138 lb 6.4 oz (62.8 kg)   LMP 11/01/2014 (Approximate)   SpO2 97%   BMI 25.31 kg/m   Wt Readings from Last 3 Encounters:  10/21/23 138 lb 6.4 oz (62.8 kg)  10/16/23 135 lb 4 oz (61.3 kg)  07/16/23 136 lb (61.7 kg)    Physical Exam        Assessment & Plan:  IC (interstitial cystitis) -     Amitriptyline  HCl; Take 1 tablet (100 mg total) by mouth at bedtime.  Dispense: 90  tablet; Refill: 0  Fibromyalgia -     DULoxetine  HCl; Take 2 capsules (120 mg total) by mouth daily.  Dispense: 180 capsule; Refill: 0     Return in about 6 months (around 04/21/2024) for f/u CPE.  Felicita Horns, MSN, APRN, FNP-C [facility]

## 2023-10-22 NOTE — Assessment & Plan Note (Signed)
 Continue follow-up with urologist as scheduled Continue amitriptyline  refill sent

## 2023-10-22 NOTE — Assessment & Plan Note (Signed)
 Encouraged therapy consult Pt is open to this and will look into this. Declines referral.

## 2023-10-22 NOTE — Assessment & Plan Note (Addendum)
 Stable Refill sent for duloxetine  120 mg once daily.  Did d/w pt that I would like for her to ask psychiatry if they could take over her cymbalta  to avoid polypharmacy.

## 2023-10-25 DIAGNOSIS — M25512 Pain in left shoulder: Secondary | ICD-10-CM | POA: Diagnosis not present

## 2023-11-01 DIAGNOSIS — F411 Generalized anxiety disorder: Secondary | ICD-10-CM | POA: Diagnosis not present

## 2023-11-01 DIAGNOSIS — G4700 Insomnia, unspecified: Secondary | ICD-10-CM | POA: Diagnosis not present

## 2023-11-01 DIAGNOSIS — F332 Major depressive disorder, recurrent severe without psychotic features: Secondary | ICD-10-CM | POA: Diagnosis not present

## 2023-11-12 ENCOUNTER — Other Ambulatory Visit: Payer: Self-pay | Admitting: Nurse Practitioner

## 2023-11-12 ENCOUNTER — Other Ambulatory Visit: Payer: Self-pay | Admitting: Family

## 2023-11-12 DIAGNOSIS — F5101 Primary insomnia: Secondary | ICD-10-CM

## 2023-11-12 DIAGNOSIS — M25512 Pain in left shoulder: Secondary | ICD-10-CM

## 2023-11-12 DIAGNOSIS — F332 Major depressive disorder, recurrent severe without psychotic features: Secondary | ICD-10-CM

## 2023-11-12 DIAGNOSIS — F411 Generalized anxiety disorder: Secondary | ICD-10-CM

## 2023-11-12 NOTE — Telephone Encounter (Signed)
 Can we ask pt if she has made an appt with a psychiatrist yet?  I will refill the seroquel  one more time but prior to next refill she will need to be following with a psychiatrist for future fills.   How is the seroquel  working for her?

## 2023-11-22 DIAGNOSIS — M25512 Pain in left shoulder: Secondary | ICD-10-CM | POA: Diagnosis not present

## 2023-11-26 ENCOUNTER — Telehealth: Payer: Self-pay

## 2023-11-26 NOTE — Telephone Encounter (Signed)
 Copied from CRM [id number]. Topic: General - Other >> Nov 26, 2023 12:56 PM Howard Macho wrote: Reason for CRM: angel from [employer] called stating she wanted to speak to someone from the referral department because a claim was denied because a referral is needed. Dates of service  07/30/23, 10/04/23, and 11/01/23. Dellar Fenton states the patient sees Audree Leas Fax-[phone] CB [phone]

## 2023-12-10 ENCOUNTER — Other Ambulatory Visit: Payer: Self-pay | Admitting: Family

## 2023-12-10 DIAGNOSIS — F411 Generalized anxiety disorder: Secondary | ICD-10-CM

## 2023-12-10 DIAGNOSIS — F332 Major depressive disorder, recurrent severe without psychotic features: Secondary | ICD-10-CM

## 2023-12-10 DIAGNOSIS — F5101 Primary insomnia: Secondary | ICD-10-CM

## 2023-12-10 NOTE — Telephone Encounter (Signed)
 Can you ask pt if she advised her psychiatrist that they would need to take over her other psychiatric meds? I received a refill request for seroquel  and had asked for them to take over.

## 2023-12-26 ENCOUNTER — Other Ambulatory Visit: Payer: Self-pay | Admitting: Family

## 2023-12-26 DIAGNOSIS — M25512 Pain in left shoulder: Secondary | ICD-10-CM

## 2024-01-03 ENCOUNTER — Other Ambulatory Visit: Payer: Self-pay | Admitting: Family

## 2024-01-03 DIAGNOSIS — M797 Fibromyalgia: Secondary | ICD-10-CM

## 2024-01-03 DIAGNOSIS — N3010 Interstitial cystitis (chronic) without hematuria: Secondary | ICD-10-CM

## 2024-01-06 NOTE — Telephone Encounter (Signed)
 Last refill: 10/21/23 #90 tablets with no refills  LOV 10/21/23 NOV nothing scheduled

## 2024-01-09 DIAGNOSIS — M25571 Pain in right ankle and joints of right foot: Secondary | ICD-10-CM | POA: Diagnosis not present

## 2024-01-09 DIAGNOSIS — M25561 Pain in right knee: Secondary | ICD-10-CM | POA: Diagnosis not present

## 2024-01-13 ENCOUNTER — Encounter: Payer: Self-pay | Admitting: Family

## 2024-01-13 ENCOUNTER — Other Ambulatory Visit: Payer: Self-pay | Admitting: Family

## 2024-01-13 DIAGNOSIS — F411 Generalized anxiety disorder: Secondary | ICD-10-CM

## 2024-01-13 DIAGNOSIS — F5101 Primary insomnia: Secondary | ICD-10-CM

## 2024-01-13 DIAGNOSIS — F332 Major depressive disorder, recurrent severe without psychotic features: Secondary | ICD-10-CM

## 2024-01-13 NOTE — Telephone Encounter (Signed)
 LOV 10/21/23  Last refill 12/12/23 #30 W/ 0 Refill   NOV nothing scheduled

## 2024-01-15 NOTE — Telephone Encounter (Signed)
 Please see if she able to get in for either one of my same days on Friday.  Sounds like she has a few key issues to address that are more urgent in nature and we can write the letter for her during that visit as we will need documentation supporting this. Tell her I have zero problems signing the letter

## 2024-01-15 NOTE — Telephone Encounter (Signed)
 Spoke to pt. Made OV for 01-17-24. She said she was doing ok.

## 2024-01-17 ENCOUNTER — Ambulatory Visit (INDEPENDENT_AMBULATORY_CARE_PROVIDER_SITE_OTHER): Admitting: Family

## 2024-01-17 VITALS — BP 116/80 | HR 85 | Temp 97.0°F | Ht 62.0 in | Wt 129.0 lb

## 2024-01-17 DIAGNOSIS — F5101 Primary insomnia: Secondary | ICD-10-CM | POA: Diagnosis not present

## 2024-01-17 DIAGNOSIS — F332 Major depressive disorder, recurrent severe without psychotic features: Secondary | ICD-10-CM

## 2024-01-17 DIAGNOSIS — F411 Generalized anxiety disorder: Secondary | ICD-10-CM | POA: Diagnosis not present

## 2024-01-17 MED ORDER — QUETIAPINE FUMARATE 25 MG PO TABS: 25.0000 mg | ORAL_TABLET | Freq: Every day | ORAL | 0 refills | Status: DC

## 2024-01-17 NOTE — Progress Notes (Signed)
 Established Patient Office Visit  Subjective:      CC:  Chief Complaint  Patient presents with   Jury Duty Dismissal    Received summons for [employer]. Pt need a letter to be permanently exempted.     HPI: Sharon Bowman is a 48 y.o. female presenting on 01/17/2024 for [employer] Dismissal (Received summons for [employer]. Pt need a letter to be permanently exempted. )  Anxiety depression, MDD: currently taking seroquel  25 mg nightly and duloxetine  60 mg once daily. She also takes elavil  100 mg   She has recently been summoned for jury duty. Juror # [id number], dated for 7/21 at 815. She has significant anxiety and has had a traumatic experience in court x one year ago of which even the thought of having to order a court room again gives her severe anxiety and she has had panic attacks since receiving this jury duty notification. She also has interstitial cystitis that makes it very difficult for her to sit for long periods as well as requires her to have to have 24/7 assess to the bathroom, and this would be inhibited and not easy while serving on a jury. Fibromyalgia also is another dx that causes intermittent flares that at times are very painful and make it hard to concentrate and or focus for long periods of time.   She does state recent psychiatrist was starting her on lamictal but she was due for a follow up appt with them of which she was unable to make the appt (due to them cancelling the appt and some confusion within the office) so she has since ran out. She is requesting an alternate referral.    Social history:  Relevant past medical, surgical, family and social history reviewed and updated as indicated. Interim medical history since our last visit reviewed.  Allergies and medications reviewed and updated.  DATA REVIEWED: CHART IN EPIC     ROS: Negative unless specifically indicated above in HPI.    Current Outpatient Medications:    amitriptyline  (ELAVIL ) 100 MG  tablet, TAKE 1 TABLET AT BEDTIME, Disp: 90 tablet, Rfl: 3   aspirin  EC 81 MG tablet, Take 81 mg by mouth daily. Swallow whole., Disp: , Rfl:    aspirin -acetaminophen -caffeine  (EXCEDRIN  MIGRAINE) 250-250-65 MG per tablet, Take 1 tablet by mouth every 6 (six) hours as needed for headache or migraine. \, Disp: , Rfl:    Cholecalciferol  1.25 MG (50000 UT) TABS, Take 1 tablet by mouth once a week., Disp: 8 tablet, Rfl: 0   Cyanocobalamin  (B-12) 50 MCG TABS, Take 50 mcg by mouth daily. , Disp: , Rfl:    DULoxetine  (CYMBALTA ) 60 MG capsule, TAKE 2 CAPSULES EVERY DAY, Disp: 180 capsule, Rfl: 3   estradiol  (VIVELLE -DOT) 0.1 MG/24HR patch, Place 1 patch onto the skin 2 (two) times a week. Change on Wednesday and Sundays, Disp: , Rfl:    ferrous sulfate  325 (65 FE) MG tablet, Take 325 mg by mouth daily with breakfast., Disp: , Rfl:    gabapentin  (NEURONTIN ) 400 MG capsule, TAKE 1 CAPSULE BY MOUTH 3 TIMES DAILY., Disp: 270 capsule, Rfl: 0   meloxicam  (MOBIC ) 7.5 MG tablet, TAKE 1 TABLET BY MOUTH EVERY DAY, Disp: 30 tablet, Rfl: 0   Multiple Vitamin (MULTI-DAY VITAMINS PO), Take 1 tablet by mouth daily., Disp: , Rfl:    vitamin C  (ASCORBIC ACID ) 500 MG tablet, Take 500 mg by mouth daily., Disp: , Rfl:    QUEtiapine  (SEROQUEL ) 25 MG tablet,  Take 1 tablet (25 mg total) by mouth at bedtime., Disp: 90 tablet, Rfl: 0      Objective:    BP 116/80 (BP Location: Left Arm, Patient Position: Sitting, Cuff Size: Normal)   Pulse 85   Temp (!) 97 F (36.1 C) (Oral)   Ht 5' 2 (1.575 m)   Wt 129 lb (58.5 kg)   LMP 11/01/2014 (Approximate)   SpO2 99%   BMI 23.59 kg/m   Wt Readings from Last 3 Encounters:  01/17/24 129 lb (58.5 kg)  10/21/23 138 lb 6.4 oz (62.8 kg)  10/16/23 135 lb 4 oz (61.3 kg)    Physical Exam Vitals reviewed.  Constitutional:      General: She is not in acute distress.    Appearance: Normal appearance. She is normal weight. She is not ill-appearing, toxic-appearing or diaphoretic.   HENT:     Head: Normocephalic.  Cardiovascular:     Rate and Rhythm: Normal rate and regular rhythm.  Pulmonary:     Effort: Pulmonary effort is normal.     Breath sounds: Normal breath sounds.  Musculoskeletal:        General: Normal range of motion.  Neurological:     General: No focal deficit present.     Mental Status: She is alert and oriented to person, place, and time. Mental status is at baseline.  Psychiatric:        Mood and Affect: Mood normal.        Behavior: Behavior normal.        Thought Content: Thought content normal.        Judgment: Judgment normal.           Assessment & Plan:  MDD (major depressive disorder), recurrent severe, without psychosis (HCC) Assessment & Plan: Important for pt to get with psychiatry and psychology as she will need medication management. She was on lamotrigine but recently ran out I'm assuming for mood disorder, notes not readily available.    Orders: -     Ambulatory referral to Psychiatry -     Ambulatory referral to Psychology -     QUEtiapine  Fumarate; Take 1 tablet (25 mg total) by mouth at bedtime.  Dispense: 90 tablet; Refill: 0  Anxiety state -     Ambulatory referral to Psychiatry -     Ambulatory referral to Psychology -     QUEtiapine  Fumarate; Take 1 tablet (25 mg total) by mouth at bedtime.  Dispense: 90 tablet; Refill: 0  Primary insomnia -     QUEtiapine  Fumarate; Take 1 tablet (25 mg total) by mouth at bedtime.  Dispense: 90 tablet; Refill: 0   At this time I do not feel that it is helpful for pt to serve ont he jury as it causes her great stresses, and she has two different co morbidities of which are chronic that interfere with her ability to sit for a long period of time as well as her lack of concentration/focus.    Return in about 3 months (around 04/18/2024) for f/u CPE.  Ginger Patrick, MSN, APRN, FNP-C [facility]

## 2024-01-17 NOTE — Patient Instructions (Addendum)
  Make sure that psychology (therapy) is with someone that is certified/trained with trauma therapy (EMDR,  brain spotting, CBT, DBT )  A referral was placed today for psychiatry  Please let us  know if you have not heard back within 2 weeks about the referral.  See if they take your insurance, I like them a lot (therapy) [facility]  tel: [phone]

## 2024-01-17 NOTE — Assessment & Plan Note (Signed)
 Important for pt to get with psychiatry and psychology as she will need medication management. She was on lamotrigine but recently ran out I'm assuming for mood disorder, notes not readily available.

## 2024-01-22 ENCOUNTER — Telehealth: Payer: Self-pay

## 2024-01-22 NOTE — Telephone Encounter (Signed)
 Copied from CRM #[id number]. Topic: Referral - Status >> Jan 22, 2024  4:28 PM Charolett L wrote: Reason for CRM: [facility], [facility] is calling because they don't do anything dealing with brain spotting closely related to Brainspotting  And or tailored brain health I think they do brain spotting?  Either verbiage or facility needs to be changed Cb# [phone]

## 2024-01-23 DIAGNOSIS — M25561 Pain in right knee: Secondary | ICD-10-CM | POA: Diagnosis not present

## 2024-01-23 NOTE — Telephone Encounter (Signed)
 Can we change the psychiatry referral to [facility] in [place], if insurance covers?

## 2024-01-24 ENCOUNTER — Encounter: Payer: Self-pay | Admitting: *Deleted

## 2024-01-24 ENCOUNTER — Other Ambulatory Visit: Payer: Self-pay | Admitting: Orthopedic Surgery

## 2024-01-24 DIAGNOSIS — G8929 Other chronic pain: Secondary | ICD-10-CM

## 2024-01-24 DIAGNOSIS — M25512 Pain in left shoulder: Secondary | ICD-10-CM | POA: Diagnosis not present

## 2024-01-24 NOTE — Telephone Encounter (Signed)
 I have rerouted this referral to [facility] as requested.   Sent patient a letter via MyChart.

## 2024-01-31 ENCOUNTER — Other Ambulatory Visit: Payer: Self-pay | Admitting: Family

## 2024-01-31 DIAGNOSIS — M25512 Pain in left shoulder: Secondary | ICD-10-CM

## 2024-02-07 ENCOUNTER — Ambulatory Visit
Admission: RE | Admit: 2024-02-07 | Discharge: 2024-02-07 | Disposition: A | Discharge status: Home / Self Care | Source: Ambulatory Visit | Attending: Orthopedic Surgery | Admitting: Orthopedic Surgery

## 2024-02-07 DIAGNOSIS — G8929 Other chronic pain: Secondary | ICD-10-CM

## 2024-02-07 DIAGNOSIS — M7582 Other shoulder lesions, left shoulder: Secondary | ICD-10-CM | POA: Diagnosis not present

## 2024-02-21 DIAGNOSIS — M25512 Pain in left shoulder: Secondary | ICD-10-CM | POA: Diagnosis not present

## 2024-03-12 ENCOUNTER — Ambulatory Visit (INDEPENDENT_AMBULATORY_CARE_PROVIDER_SITE_OTHER)
Admission: RE | Admit: 2024-03-12 | Discharge: 2024-03-12 | Disposition: A | Discharge status: Home / Self Care | Source: Ambulatory Visit | Attending: Family

## 2024-03-12 ENCOUNTER — Ambulatory Visit: Payer: Self-pay | Admitting: Family

## 2024-03-12 ENCOUNTER — Ambulatory Visit (INDEPENDENT_AMBULATORY_CARE_PROVIDER_SITE_OTHER): Admitting: Family

## 2024-03-12 ENCOUNTER — Encounter: Payer: Self-pay | Admitting: Family

## 2024-03-12 VITALS — BP 122/84 | HR 84 | Temp 97.8°F | Ht 62.0 in | Wt 128.8 lb

## 2024-03-12 DIAGNOSIS — F411 Generalized anxiety disorder: Secondary | ICD-10-CM | POA: Diagnosis not present

## 2024-03-12 DIAGNOSIS — S6991XA Unspecified injury of right wrist, hand and finger(s), initial encounter: Secondary | ICD-10-CM

## 2024-03-12 DIAGNOSIS — F332 Major depressive disorder, recurrent severe without psychotic features: Secondary | ICD-10-CM | POA: Diagnosis not present

## 2024-03-12 DIAGNOSIS — F5101 Primary insomnia: Secondary | ICD-10-CM

## 2024-03-12 DIAGNOSIS — M25512 Pain in left shoulder: Secondary | ICD-10-CM

## 2024-03-12 DIAGNOSIS — M797 Fibromyalgia: Secondary | ICD-10-CM

## 2024-03-12 MED ORDER — QUETIAPINE FUMARATE 25 MG PO TABS: 25.0000 mg | ORAL_TABLET | Freq: Every day | ORAL | 0 refills | Status: AC

## 2024-03-12 MED ORDER — HYDROXYZINE PAMOATE 25 MG PO CAPS: 25.0000 mg | ORAL_CAPSULE | Freq: Three times a day (TID) | ORAL | 0 refills | Status: AC | PRN

## 2024-03-12 MED ORDER — MELOXICAM 7.5 MG PO TABS: 7.5000 mg | ORAL_TABLET | Freq: Every day | ORAL | 0 refills | Status: AC

## 2024-03-12 NOTE — Patient Instructions (Signed)
  Call this office to get set up with psychiatry.   [facility] [address] [place] [address] [phone]   ------------------------------------

## 2024-03-12 NOTE — Progress Notes (Signed)
 Established Patient Office Visit  Subjective:      CC:  Chief Complaint  Patient presents with   Medical Management of Chronic Issues    HPI: Sharon Bowman is a 48 y.o. female presenting on 03/12/2024 for Medical Management of Chronic Issues .  Discussed the use of AI scribe software for clinical note transcription with the patient, who gave verbal consent to proceed.  History of Present Illness Sharon Bowman is a 48 year old female who presents with right hand pain and swelling.  She has been experiencing pain and swelling at the right base of her thumb for over a week. There is no recollection of a specific injury, although she mentions hitting it on something. Pain occurs when gripping objects, but there is no significant restriction in motion. She has been taking Tylenol , which provides some relief.  She is currently taking Seroquel  25 mg, Cymbalta  60 mg, and gabapentin  400 mg three times a day. She inquires about increasing the gabapentin  dosage, which was initially prescribed by an orthopedic doctor for cubital tunnel syndrome. She also takes meloxicam  7.5 mg daily.  She is seeing a counselor, Inocente Furnace, for therapy and is in the process of setting up psychiatric care for medication management. She mentions a previous MyChart message regarding a referral to [facility] but is unsure if she followed up. She is also concerned about the use of Klonopin and discusses alternatives like hydroxyzine , which she has taken before without issues.         Social history:  Relevant past medical, surgical, family and social history reviewed and updated as indicated. Interim medical history since our last visit reviewed.  Allergies and medications reviewed and updated.  DATA REVIEWED: CHART IN EPIC     ROS: Negative unless specifically indicated above in HPI.    Current Outpatient Medications:    amitriptyline  (ELAVIL ) 100 MG tablet, TAKE 1 TABLET AT BEDTIME,  Disp: 90 tablet, Rfl: 3   aspirin  EC 81 MG tablet, Take 81 mg by mouth daily. Swallow whole., Disp: , Rfl:    aspirin -acetaminophen -caffeine  (EXCEDRIN  MIGRAINE) 250-250-65 MG per tablet, Take 1 tablet by mouth every 6 (six) hours as needed for headache or migraine. \, Disp: , Rfl:    Cholecalciferol  1.25 MG (50000 UT) TABS, Take 1 tablet by mouth once a week., Disp: 8 tablet, Rfl: 0   Cyanocobalamin  (B-12) 50 MCG TABS, Take 50 mcg by mouth daily. , Disp: , Rfl:    DULoxetine  (CYMBALTA ) 60 MG capsule, TAKE 2 CAPSULES EVERY DAY, Disp: 180 capsule, Rfl: 3   estradiol  (VIVELLE -DOT) 0.1 MG/24HR patch, Place 1 patch onto the skin 2 (two) times a week. Change on Wednesday and Sundays, Disp: , Rfl:    ferrous sulfate  325 (65 FE) MG tablet, Take 325 mg by mouth daily with breakfast., Disp: , Rfl:    gabapentin  (NEURONTIN ) 400 MG capsule, TAKE 1 CAPSULE BY MOUTH 3 TIMES DAILY., Disp: 270 capsule, Rfl: 0   Multiple Vitamin (MULTI-DAY VITAMINS PO), Take 1 tablet by mouth daily., Disp: , Rfl:    vitamin C  (ASCORBIC ACID ) 500 MG tablet, Take 500 mg by mouth daily., Disp: , Rfl:    meloxicam  (MOBIC ) 7.5 MG tablet, Take 1 tablet (7.5 mg total) by mouth daily., Disp: 90 tablet, Rfl: 0   QUEtiapine  (SEROQUEL ) 25 MG tablet, Take 1 tablet (25 mg total) by mouth at bedtime., Disp: 90 tablet, Rfl: 0        Objective:  BP 122/84 (BP Location: Left Arm, Patient Position: Sitting, Cuff Size: Normal)   Pulse 84   Temp 97.8 F (36.6 C) (Temporal)   Ht 5' 2 (1.575 m)   Wt 128 lb 12.8 oz (58.4 kg)   LMP 11/01/2014 (Approximate)   SpO2 98%   BMI 23.56 kg/m   Physical Exam MUSCULOSKELETAL: Right hand swollen at the base of the thumb, tender with movement. Grip strength intact. Tenderness at the base of the right thumb and pointer finger.  Wt Readings from Last 3 Encounters:  03/12/24 128 lb 12.8 oz (58.4 kg)  01/17/24 129 lb (58.5 kg)  10/21/23 138 lb 6.4 oz (62.8 kg)    Physical  Exam Constitutional:      General: She is not in acute distress.    Appearance: Normal appearance. She is normal weight. She is not ill-appearing, toxic-appearing or diaphoretic.  HENT:     Head: Normocephalic.  Cardiovascular:     Rate and Rhythm: Normal rate.  Pulmonary:     Effort: Pulmonary effort is normal.  Musculoskeletal:        General: Normal range of motion.     Right hand: Swelling (base of thumb) and tenderness present. Normal range of motion. Normal strength.  Neurological:     General: No focal deficit present.     Mental Status: She is alert and oriented to person, place, and time. Mental status is at baseline.  Psychiatric:        Mood and Affect: Mood normal.        Behavior: Behavior normal.        Thought Content: Thought content normal.        Judgment: Judgment normal.          Results   Assessment & Plan:   Assessment and Plan Assessment & Plan Right hand pain and swelling at base of thumb Swelling and pain at the base of the right thumb, persisting for over a week. No significant restriction in motion, but tenderness and bruising present. Likely a bruise, but x-ray warranted to rule out other causes. - Order x-ray of right hand to rule out fracture or other pathology - Continue Tylenol  as needed for pain relief  Generalized anxiety disorder and depression Currently seeing a counselor, Inocente Furnace, for therapy. Needs a psychiatrist for medication management. Concerns about Klonopin due to potential for addiction and links to Alzheimer's. Hydroxyzine  discussed as an alternative for anxiety management. - Call [facility] to set up appointment for medication management - Consider switching from Klonopin to Hydroxyzine  for anxiety management - Continue current medications: Duloxetine  60 mg daily, Quetiapine  25 mg at bedtime - Send refill for Quetiapine   Primary insomnia Discussed potential sedative effects of current medication regimen,  including Gabapentin  and Quetiapine .  Fibromyalgia Discussed potential increase in Gabapentin  dosage for pain management, but advised to consult with orthopedist first. Meloxicam  discussed for daily use with caution for gastrointestinal side effects. - Consult orthopedist regarding potential increase in Gabapentin  dosage - Continue Meloxicam  7.5 mg daily, monitor for gastrointestinal side effects - Send refill for Meloxicam   Recording duration: 14 minutes      Return in about 6 months (around 09/09/2024) for f/u CPE.     Ginger Patrick, MSN, APRN, FNP-C [facility]

## 2024-03-13 ENCOUNTER — Other Ambulatory Visit: Payer: Self-pay | Admitting: Family

## 2024-03-13 DIAGNOSIS — M797 Fibromyalgia: Secondary | ICD-10-CM

## 2024-03-13 DIAGNOSIS — M2550 Pain in unspecified joint: Secondary | ICD-10-CM

## 2024-03-20 DIAGNOSIS — M25512 Pain in left shoulder: Secondary | ICD-10-CM | POA: Diagnosis not present

## 2024-04-10 ENCOUNTER — Ambulatory Visit

## 2024-04-13 ENCOUNTER — Ambulatory Visit

## 2024-04-23 DIAGNOSIS — M79601 Pain in right arm: Secondary | ICD-10-CM | POA: Diagnosis not present

## 2024-04-23 DIAGNOSIS — M79644 Pain in right finger(s): Secondary | ICD-10-CM | POA: Diagnosis not present

## 2024-04-24 DIAGNOSIS — F349 Persistent mood [affective] disorder, unspecified: Secondary | ICD-10-CM | POA: Diagnosis not present

## 2024-04-24 DIAGNOSIS — F4310 Post-traumatic stress disorder, unspecified: Secondary | ICD-10-CM | POA: Diagnosis not present

## 2024-04-24 DIAGNOSIS — Z5181 Encounter for therapeutic drug level monitoring: Secondary | ICD-10-CM | POA: Diagnosis not present

## 2024-04-24 DIAGNOSIS — F411 Generalized anxiety disorder: Secondary | ICD-10-CM | POA: Diagnosis not present

## 2024-04-29 DIAGNOSIS — M79671 Pain in right foot: Secondary | ICD-10-CM | POA: Diagnosis not present

## 2024-04-29 DIAGNOSIS — M79672 Pain in left foot: Secondary | ICD-10-CM | POA: Diagnosis not present

## 2024-05-01 DIAGNOSIS — Q613 Polycystic kidney, unspecified: Secondary | ICD-10-CM | POA: Diagnosis not present

## 2024-05-01 DIAGNOSIS — E7800 Pure hypercholesterolemia, unspecified: Secondary | ICD-10-CM | POA: Diagnosis not present

## 2024-05-01 DIAGNOSIS — F325 Major depressive disorder, single episode, in full remission: Secondary | ICD-10-CM | POA: Diagnosis not present

## 2024-05-01 DIAGNOSIS — R7303 Prediabetes: Secondary | ICD-10-CM | POA: Diagnosis not present

## 2024-05-01 DIAGNOSIS — N3010 Interstitial cystitis (chronic) without hematuria: Secondary | ICD-10-CM | POA: Diagnosis not present

## 2024-05-01 DIAGNOSIS — N181 Chronic kidney disease, stage 1: Secondary | ICD-10-CM | POA: Diagnosis not present

## 2024-05-01 DIAGNOSIS — E611 Iron deficiency: Secondary | ICD-10-CM | POA: Diagnosis not present

## 2024-05-02 LAB — LAB REPORT - SCANNED
A1c: 5.5
Albumin, Urine POC: <3
Creatinine, POC: 21.8 mg/dL
EGFR: 80
Microalb Creat Ratio: <14

## 2024-05-15 DIAGNOSIS — F4310 Post-traumatic stress disorder, unspecified: Secondary | ICD-10-CM | POA: Diagnosis not present

## 2024-05-15 DIAGNOSIS — F411 Generalized anxiety disorder: Secondary | ICD-10-CM | POA: Diagnosis not present

## 2024-05-15 DIAGNOSIS — F349 Persistent mood [affective] disorder, unspecified: Secondary | ICD-10-CM | POA: Diagnosis not present

## 2024-07-14 LAB — HM MAMMOGRAPHY

## 2024-07-27 ENCOUNTER — Encounter: Payer: Self-pay | Admitting: Family

## 2024-07-27 DIAGNOSIS — I700 Atherosclerosis of aorta: Secondary | ICD-10-CM | POA: Insufficient documentation

## 2024-07-31 ENCOUNTER — Other Ambulatory Visit: Payer: Self-pay | Admitting: *Deleted

## 2024-07-31 DIAGNOSIS — M797 Fibromyalgia: Secondary | ICD-10-CM

## 2024-07-31 DIAGNOSIS — M2550 Pain in unspecified joint: Secondary | ICD-10-CM

## 2024-07-31 MED ORDER — GABAPENTIN 400 MG PO CAPS: 400.0000 mg | ORAL_CAPSULE | Freq: Three times a day (TID) | ORAL | 0 refills | Status: AC
